# Patient Record
Sex: Male | Born: 1945
Health system: Southern US, Community
[De-identification: ages and names within clinical notes are randomized; demographics above are authoritative.]

## PROBLEM LIST (undated history)

## (undated) DIAGNOSIS — K219 Gastro-esophageal reflux disease without esophagitis: Secondary | ICD-10-CM

## (undated) DIAGNOSIS — I10 Essential (primary) hypertension: Secondary | ICD-10-CM

## (undated) DIAGNOSIS — IMO0002 Reserved for concepts with insufficient information to code with codable children: Secondary | ICD-10-CM

## (undated) DIAGNOSIS — J189 Pneumonia, unspecified organism: Secondary | ICD-10-CM

## (undated) DIAGNOSIS — M1990 Unspecified osteoarthritis, unspecified site: Secondary | ICD-10-CM

## (undated) DIAGNOSIS — R7303 Prediabetes: Secondary | ICD-10-CM

## (undated) DIAGNOSIS — C4490 Unspecified malignant neoplasm of skin, unspecified: Secondary | ICD-10-CM

## (undated) DIAGNOSIS — Z8711 Personal history of peptic ulcer disease: Secondary | ICD-10-CM

## (undated) DIAGNOSIS — N400 Enlarged prostate without lower urinary tract symptoms: Secondary | ICD-10-CM

## (undated) HISTORY — PX: JOINT REPLACEMENT: SHX530

## (undated) HISTORY — PX: TONSILLECTOMY AND ADENOIDECTOMY: SUR1326

## (undated) HISTORY — DX: Essential (primary) hypertension: I10

## (undated) HISTORY — PX: COLONOSCOPY: SHX174

## (undated) HISTORY — PX: INGUINAL HERNIA REPAIR: SUR1180

## (undated) HISTORY — DX: Benign prostatic hyperplasia without lower urinary tract symptoms: N40.0

## (undated) HISTORY — DX: Gastro-esophageal reflux disease without esophagitis: K21.9

## (undated) HISTORY — DX: Reserved for concepts with insufficient information to code with codable children: IMO0002

## (undated) HISTORY — PX: APPENDECTOMY: SHX54

## (undated) HISTORY — PX: UPPER GI ENDOSCOPY: SHX6162

---

## 2008-12-20 HISTORY — PX: COLONOSCOPY: SHX174

## 2008-12-20 LAB — HM COLONOSCOPY

## 2010-01-28 LAB — CBC AND DIFFERENTIAL
HCT: 50 % (ref 41–53)
WBC: 9.1 10^3/mL

## 2010-01-28 LAB — BASIC METABOLIC PANEL
BUN: 21 mg/dL (ref 4–21)
Creatinine: 1.1 mg/dL (ref <=1.3)
Potassium: 3.2 mmol/L — AB (ref 3.4–5.3)

## 2010-01-28 LAB — LIPID PANEL
Cholesterol: 130 mg/dL (ref 0–200)
HDL: 32 mg/dL — AB (ref 35–70)
LDL Cholesterol: 73 mg/dL

## 2010-01-28 LAB — HEPATIC FUNCTION PANEL: Alkaline Phosphatase: 72 U/L (ref 25–125)

## 2010-02-16 LAB — HEPATIC FUNCTION PANEL: AST: 23 U/L (ref 14–40)

## 2011-12-21 HISTORY — PX: MEDIAL PARTIAL KNEE REPLACEMENT: SHX5965

## 2012-03-10 LAB — HEMOGLOBIN A1C: Hgb A1c MFr Bld: 5.4 % (ref 4.0–6.0)

## 2013-08-13 DIAGNOSIS — M5137 Other intervertebral disc degeneration, lumbosacral region: Secondary | ICD-10-CM | POA: Diagnosis not present

## 2013-08-13 DIAGNOSIS — M999 Biomechanical lesion, unspecified: Secondary | ICD-10-CM | POA: Diagnosis not present

## 2013-08-15 DIAGNOSIS — M5137 Other intervertebral disc degeneration, lumbosacral region: Secondary | ICD-10-CM | POA: Diagnosis not present

## 2013-08-15 DIAGNOSIS — M999 Biomechanical lesion, unspecified: Secondary | ICD-10-CM | POA: Diagnosis not present

## 2013-09-11 DIAGNOSIS — M999 Biomechanical lesion, unspecified: Secondary | ICD-10-CM | POA: Diagnosis not present

## 2013-09-11 DIAGNOSIS — M5137 Other intervertebral disc degeneration, lumbosacral region: Secondary | ICD-10-CM | POA: Diagnosis not present

## 2013-09-12 DIAGNOSIS — Z23 Encounter for immunization: Secondary | ICD-10-CM | POA: Diagnosis not present

## 2013-09-12 DIAGNOSIS — J019 Acute sinusitis, unspecified: Secondary | ICD-10-CM | POA: Diagnosis not present

## 2013-09-24 DIAGNOSIS — Z471 Aftercare following joint replacement surgery: Secondary | ICD-10-CM | POA: Diagnosis not present

## 2013-09-24 DIAGNOSIS — Z96659 Presence of unspecified artificial knee joint: Secondary | ICD-10-CM | POA: Diagnosis not present

## 2013-10-19 ENCOUNTER — Ambulatory Visit (INDEPENDENT_AMBULATORY_CARE_PROVIDER_SITE_OTHER): Payer: Medicare Other | Admitting: Internal Medicine

## 2013-10-19 ENCOUNTER — Encounter: Payer: Self-pay | Admitting: Internal Medicine

## 2013-10-19 VITALS — BP 160/90 | HR 67 | Temp 97.4°F | Ht 66.0 in | Wt 181.2 lb

## 2013-10-19 DIAGNOSIS — K219 Gastro-esophageal reflux disease without esophagitis: Secondary | ICD-10-CM | POA: Diagnosis not present

## 2013-10-19 DIAGNOSIS — I10 Essential (primary) hypertension: Secondary | ICD-10-CM | POA: Insufficient documentation

## 2013-10-19 DIAGNOSIS — N400 Enlarged prostate without lower urinary tract symptoms: Secondary | ICD-10-CM | POA: Insufficient documentation

## 2013-10-19 HISTORY — DX: Benign prostatic hyperplasia without lower urinary tract symptoms: N40.0

## 2013-10-19 NOTE — Patient Instructions (Signed)
Health Maintenance, Males A healthy lifestyle and preventative care can promote health and wellness.  Maintain regular health, dental, and eye exams.  Eat a healthy diet. Foods like vegetables, fruits, whole grains, low-fat dairy products, and lean protein foods contain the nutrients you need without too many calories. Decrease your intake of foods high in solid fats, added sugars, and salt. Get information about a proper diet from your caregiver, if necessary.  Regular physical exercise is one of the most important things you can do for your health. Most adults should get at least 150 minutes of moderate-intensity exercise (any activity that increases your heart rate and causes you to sweat) each week. In addition, most adults need muscle-strengthening exercises on 2 or more days a week.   Maintain a healthy weight. The body mass index (BMI) is a screening tool to identify possible weight problems. It provides an estimate of body fat based on height and weight. Your caregiver can help determine your BMI, and can help you achieve or maintain a healthy weight. For adults 20 years and older:  A BMI below 18.5 is considered underweight.  A BMI of 18.5 to 24.9 is normal.  A BMI of 25 to 29.9 is considered overweight.  A BMI of 30 and above is considered obese.  Maintain normal blood lipids and cholesterol by exercising and minimizing your intake of saturated fat. Eat a balanced diet with plenty of fruits and vegetables. Blood tests for lipids and cholesterol should begin at age 20 and be repeated every 5 years. If your lipid or cholesterol levels are high, you are over 50, or you are a high risk for heart disease, you may need your cholesterol levels checked more frequently.Ongoing high lipid and cholesterol levels should be treated with medicines, if diet and exercise are not effective.  If you smoke, find out from your caregiver how to quit. If you do not use tobacco, do not start.  If you  choose to drink alcohol, do not exceed 2 drinks per day. One drink is considered to be 12 ounces (355 mL) of beer, 5 ounces (148 mL) of wine, or 1.5 ounces (44 mL) of liquor.  Avoid use of street drugs. Do not share needles with anyone. Ask for help if you need support or instructions about stopping the use of drugs.  High blood pressure causes heart disease and increases the risk of stroke. Blood pressure should be checked at least every 1 to 2 years. Ongoing high blood pressure should be treated with medicines if weight loss and exercise are not effective.  If you are 45 to 65 years old, ask your caregiver if you should take aspirin to prevent heart disease.  Diabetes screening involves taking a blood sample to check your fasting blood sugar level. This should be done once every 3 years, after age 45, if you are within normal weight and without risk factors for diabetes. Testing should be considered at a younger age or be carried out more frequently if you are overweight and have at least 1 risk factor for diabetes.  Colorectal cancer can be detected and often prevented. Most routine colorectal cancer screening begins at the age of 50 and continues through age 75. However, your caregiver may recommend screening at an earlier age if you have risk factors for colon cancer. On a yearly basis, your caregiver may provide home test kits to check for hidden blood in the stool. Use of a small camera at the end of a tube,   to directly examine the colon (sigmoidoscopy or colonoscopy), can detect the earliest forms of colorectal cancer. Talk to your caregiver about this at age 50, when routine screening begins. Direct examination of the colon should be repeated every 5 to 10 years through age 75, unless early forms of pre-cancerous polyps or small growths are found.  Hepatitis C blood testing is recommended for all people born from 1945 through 1965 and any individual with known risks for hepatitis C.  Healthy  men should no longer receive prostate-specific antigen (PSA) blood tests as part of routine cancer screening. Consult with your caregiver about prostate cancer screening.  Testicular cancer screening is not recommended for adolescents or adult males who have no symptoms. Screening includes self-exam, caregiver exam, and other screening tests. Consult with your caregiver about any symptoms you have or any concerns you have about testicular cancer.  Practice safe sex. Use condoms and avoid high-risk sexual practices to reduce the spread of sexually transmitted infections (STIs).  Use sunscreen with a sun protection factor (SPF) of 30 or greater. Apply sunscreen liberally and repeatedly throughout the day. You should seek shade when your shadow is shorter than you. Protect yourself by wearing long sleeves, pants, a wide-brimmed hat, and sunglasses year round, whenever you are outdoors.  Notify your caregiver of new moles or changes in moles, especially if there is a change in shape or color. Also notify your caregiver if a mole is larger than the size of a pencil eraser.  A one-time screening for abdominal aortic aneurysm (AAA) and surgical repair of large AAAs by sound wave imaging (ultrasonography) is recommended for ages 65 to 75 years who are current or former smokers.  Stay current with your immunizations. Document Released: 06/03/2008 Document Revised: 02/28/2012 Document Reviewed: 05/03/2011 [employer] Patient Information 2014 [employer].  

## 2013-10-19 NOTE — Assessment & Plan Note (Signed)
Well controlled on flomax and finsateride

## 2013-10-19 NOTE — Assessment & Plan Note (Signed)
Well controlled on prilosec.

## 2013-10-19 NOTE — Assessment & Plan Note (Signed)
Elevated today- he reports white coat syndrome BP usually 130/80 Continue current therapy Will check labs in 03/2014  Medical records reviewed. Time spent reviewing reecords approx 20 minutes

## 2013-10-19 NOTE — Progress Notes (Signed)
HPI Pt presents to the clinic today to establish care. He is transferring care from his PCP in [place]. He moved down her about 5 years. He has no concerns today.  Flu: yearly last 2014 Tetanus: 2013 Pneumovax: 2013 Colonoscopy: 2011 (5 years) Eye doctor: yearly Dentist: yearly PSA screening: 2014  Past Medical History  Diagnosis Date  . GERD (gastroesophageal reflux disease)   . Hypertension     Current Outpatient Prescriptions  Medication Sig Dispense Refill  . calcium carbonate (OS-CAL) 600 MG TABS tablet Take 600 mg by mouth 2 (two) times daily with a meal.      . finasteride (PROSCAR) 5 MG tablet Take 5 mg by mouth daily.      . Glucosamine-Chondroitin 250-200 MG TABS Take by mouth 2 (two) times daily.      . hydrochlorothiazide (HYDRODIURIL) 25 MG tablet Take 25 mg by mouth daily.      Marland Kitchen lisinopril (PRINIVIL,ZESTRIL) 40 MG tablet Take 40 mg by mouth daily.      . Multiple Vitamins-Minerals (CENTRUM SILVER ULTRA MENS) TABS Take by mouth daily.      Marland Kitchen omeprazole (PRILOSEC) 20 MG capsule Take 20 mg by mouth daily.      . tamsulosin (FLOMAX) 0.4 MG CAPS capsule Take 0.4 mg by mouth daily.       No current facility-administered medications for this visit.    Not on File  Family History  Problem Relation Age of Onset  . Hypertension Mother   . Cancer Father     lung  . Hyperlipidemia Maternal Grandfather   . Stroke Paternal Grandmother   . Hyperlipidemia Paternal Grandfather   . Diabetes Neg Hx     History   Social History  . Marital Status: Married    Spouse Name: N/A    Number of Children: N/A  . Years of Education: N/A   Occupational History  . Not on file.   Social History Main Topics  . Smoking status: Never Smoker   . Smokeless tobacco: Never Used  . Alcohol Use: Not on file  . Drug Use: Not on file  . Sexual Activity: Yes   Other Topics Concern  . Not on file   Social History Narrative  . No narrative on file    ROS:  Constitutional:  Denies fever, malaise, fatigue, headache or abrupt weight changes.  HEENT: Denies eye pain, eye redness, ear pain, ringing in the ears, wax buildup, runny nose, nasal congestion, bloody nose, or sore throat. Respiratory: Denies difficulty breathing, shortness of breath, cough or sputum production.   Cardiovascular: Denies chest pain, chest tightness, palpitations or swelling in the hands or feet.  Gastrointestinal: Denies abdominal pain, bloating, constipation, diarrhea or blood in the stool.  GU: Denies frequency, urgency, pain with urination, blood in urine, odor or discharge. Musculoskeletal: Denies decrease in range of motion, difficulty with gait, muscle pain or joint pain and swelling.  Skin: Denies redness, rashes, lesions or ulcercations.  Neurological: Denies dizziness, difficulty with memory, difficulty with speech or problems with balance and coordination.   No other specific complaints in a complete review of systems (except as listed in HPI above).  PE:  BP 160/90  Pulse 67  Temp(Src) 97.4 F (36.3 C) (Oral)  Ht 5\' 6"  (1.676 m)  Wt 181 lb 3.2 oz (82.192 kg)  BMI 29.26 kg/m2  SpO2 98% Wt Readings from Last 3 Encounters:  10/19/13 181 lb 3.2 oz (82.192 kg)    General: Appears his stated age, well  developed, well nourished in NAD. HEENT: Head: normal shape and size; Eyes: sclera white, no icterus, conjunctiva pink, PERRLA and EOMs intact; Ears: Tm's gray and intact, normal light reflex; Nose: mucosa pink and moist, septum midline; Throat/Mouth: Teeth present, mucosa pink and moist, no lesions or ulcerations noted.  Neck: Normal range of motion. Neck supple, trachea midline. No massses, lumps or thyromegaly present.  Cardiovascular: Normal rate and rhythm. S1,S2 noted.  No murmur, rubs or gallops noted. No JVD or BLE edema. No carotid bruits noted. Pulmonary/Chest: Normal effort and positive vesicular breath sounds. No respiratory distress. No wheezes, rales or ronchi noted.   Abdomen: Soft and nontender. Normal bowel sounds, no bruits noted. No distention or masses noted. Liver, spleen and kidneys non palpable. Musculoskeletal: Normal range of motion. No signs of joint swelling. No difficulty with gait.  Neurological: Alert and oriented. Cranial nerves II-XII intact. Coordination normal. +DTRs bilaterally. Psychiatric: Mood and affect normal. Behavior is normal. Judgment and thought content normal.      Assessment and Plan:  Prevent Health:  All health main UTD Will review records and send them back to you Will perform lab work in 03/2014 at next visit  RTC in 6 months or sooner if needed

## 2013-10-25 DIAGNOSIS — M5137 Other intervertebral disc degeneration, lumbosacral region: Secondary | ICD-10-CM | POA: Diagnosis not present

## 2013-10-25 DIAGNOSIS — M999 Biomechanical lesion, unspecified: Secondary | ICD-10-CM | POA: Diagnosis not present

## 2013-10-29 ENCOUNTER — Ambulatory Visit: Payer: Self-pay | Admitting: Internal Medicine

## 2013-11-21 DIAGNOSIS — M999 Biomechanical lesion, unspecified: Secondary | ICD-10-CM | POA: Diagnosis not present

## 2013-11-21 DIAGNOSIS — M5137 Other intervertebral disc degeneration, lumbosacral region: Secondary | ICD-10-CM | POA: Diagnosis not present

## 2013-12-18 DIAGNOSIS — M999 Biomechanical lesion, unspecified: Secondary | ICD-10-CM | POA: Diagnosis not present

## 2013-12-18 DIAGNOSIS — M5137 Other intervertebral disc degeneration, lumbosacral region: Secondary | ICD-10-CM | POA: Diagnosis not present

## 2014-01-06 DIAGNOSIS — R030 Elevated blood-pressure reading, without diagnosis of hypertension: Secondary | ICD-10-CM | POA: Diagnosis not present

## 2014-01-06 DIAGNOSIS — J111 Influenza due to unidentified influenza virus with other respiratory manifestations: Secondary | ICD-10-CM | POA: Diagnosis not present

## 2014-01-15 DIAGNOSIS — M999 Biomechanical lesion, unspecified: Secondary | ICD-10-CM | POA: Diagnosis not present

## 2014-01-15 DIAGNOSIS — M5137 Other intervertebral disc degeneration, lumbosacral region: Secondary | ICD-10-CM | POA: Diagnosis not present

## 2014-03-07 ENCOUNTER — Other Ambulatory Visit (INDEPENDENT_AMBULATORY_CARE_PROVIDER_SITE_OTHER): Payer: Medicare Other

## 2014-03-07 ENCOUNTER — Ambulatory Visit (INDEPENDENT_AMBULATORY_CARE_PROVIDER_SITE_OTHER): Payer: Medicare Other | Admitting: Physician Assistant

## 2014-03-07 ENCOUNTER — Encounter: Payer: Self-pay | Admitting: Physician Assistant

## 2014-03-07 VITALS — BP 122/88 | HR 84 | Temp 98.3°F | Ht 66.0 in | Wt 177.2 lb

## 2014-03-07 DIAGNOSIS — N400 Enlarged prostate without lower urinary tract symptoms: Secondary | ICD-10-CM | POA: Diagnosis not present

## 2014-03-07 DIAGNOSIS — I10 Essential (primary) hypertension: Secondary | ICD-10-CM

## 2014-03-07 DIAGNOSIS — Z0000 Encounter for general adult medical examination without abnormal findings: Secondary | ICD-10-CM

## 2014-03-07 DIAGNOSIS — K219 Gastro-esophageal reflux disease without esophagitis: Secondary | ICD-10-CM | POA: Diagnosis not present

## 2014-03-07 DIAGNOSIS — Z125 Encounter for screening for malignant neoplasm of prostate: Secondary | ICD-10-CM

## 2014-03-07 LAB — CBC WITH DIFFERENTIAL/PLATELET
BASOS PCT: 0.3 % (ref 0.0–3.0)
Basophils Absolute: 0 10*3/uL (ref 0.0–0.1)
EOS PCT: 1.9 % (ref 0.0–5.0)
Eosinophils Absolute: 0.2 10*3/uL (ref 0.0–0.7)
HEMATOCRIT: 47.6 % (ref 39.0–52.0)
HEMOGLOBIN: 16.3 g/dL (ref 13.0–17.0)
LYMPHS ABS: 2.8 10*3/uL (ref 0.7–4.0)
Lymphocytes Relative: 35 % (ref 12.0–46.0)
MCHC: 34.2 g/dL (ref 30.0–36.0)
MCV: 93.4 fl (ref 78.0–100.0)
Monocytes Absolute: 0.6 10*3/uL (ref 0.1–1.0)
Monocytes Relative: 7.1 % (ref 3.0–12.0)
NEUTROS ABS: 4.5 10*3/uL (ref 1.4–7.7)
Neutrophils Relative %: 55.7 % (ref 43.0–77.0)
PLATELETS: 253 10*3/uL (ref 150.0–400.0)
RBC: 5.1 Mil/uL (ref 4.22–5.81)
RDW: 13.2 % (ref 11.5–14.6)
WBC: 8.1 10*3/uL (ref 4.5–10.5)

## 2014-03-07 LAB — BASIC METABOLIC PANEL
BUN: 18 mg/dL (ref 6–23)
CO2: 28 mEq/L (ref 19–32)
Calcium: 9.8 mg/dL (ref 8.4–10.5)
Chloride: 99 mEq/L (ref 96–112)
Creatinine, Ser: 1.1 mg/dL (ref 0.4–1.5)
GFR: 69.79 mL/min (ref >60.00)
GLUCOSE: 100 mg/dL — AB (ref 70–99)
Potassium: 3.8 mEq/L (ref 3.5–5.1)
Sodium: 137 mEq/L (ref 135–145)

## 2014-03-07 LAB — HEPATIC FUNCTION PANEL
ALT: 25 U/L (ref 0–53)
AST: 19 U/L (ref 0–37)
Albumin: 5 g/dL (ref 3.5–5.2)
Alkaline Phosphatase: 63 U/L (ref 39–117)
BILIRUBIN TOTAL: 1 mg/dL (ref 0.3–1.2)
Bilirubin, Direct: 0.1 mg/dL (ref 0.0–0.3)
Total Protein: 7.8 g/dL (ref 6.0–8.3)

## 2014-03-07 LAB — LIPID PANEL
CHOLESTEROL: 203 mg/dL — AB (ref 0–200)
HDL: 41 mg/dL (ref >39.00)
LDL CALC: 131 mg/dL — AB (ref 0–99)
Total CHOL/HDL Ratio: 5
Triglycerides: 156 mg/dL — ABNORMAL HIGH (ref 0.0–149.0)
VLDL: 31.2 mg/dL (ref 0.0–40.0)

## 2014-03-07 LAB — URINALYSIS, ROUTINE W REFLEX MICROSCOPIC
Bilirubin Urine: NEGATIVE
HGB URINE DIPSTICK: NEGATIVE
KETONES UR: 15 — AB
Leukocytes, UA: NEGATIVE
Nitrite: NEGATIVE
Specific Gravity, Urine: 1.02 (ref 1.000–1.030)
Total Protein, Urine: NEGATIVE
URINE GLUCOSE: NEGATIVE
UROBILINOGEN UA: 0.2 (ref 0.0–1.0)
pH: 5.5 (ref 5.0–8.0)

## 2014-03-07 LAB — TSH: TSH: 1.28 u[IU]/mL (ref 0.35–5.50)

## 2014-03-07 LAB — PSA: PSA: 1.94 ng/mL (ref 0.10–4.00)

## 2014-03-07 MED ORDER — FINASTERIDE 5 MG PO TABS: 5.0000 mg | ORAL_TABLET | Freq: Every day | ORAL | Status: DC

## 2014-03-07 MED ORDER — HYDROCHLOROTHIAZIDE 25 MG PO TABS: 25.0000 mg | ORAL_TABLET | Freq: Every day | ORAL | Status: DC

## 2014-03-07 MED ORDER — TAMSULOSIN HCL 0.4 MG PO CAPS: 0.4000 mg | ORAL_CAPSULE | Freq: Every day | ORAL | Status: DC

## 2014-03-07 MED ORDER — LISINOPRIL 40 MG PO TABS: 40.0000 mg | ORAL_TABLET | Freq: Every day | ORAL | Status: DC

## 2014-03-07 NOTE — Progress Notes (Signed)
Pre visit review using our clinic review tool, if applicable. No additional management support is needed unless otherwise documented below in the visit note. 

## 2014-03-07 NOTE — Progress Notes (Signed)
Patient ID: Christian Dodson is a 68 y.o. male DOB: Dec 10, 2046 MRN: [id number]     HPI:  Patient is a 68 year old male who presents for his yearly wellness exam. Patient is new to me although he has been seen in this office before.  He moved to this area nearly a year ago. Previously lived in [place].  He is current and up to date on immunizations. History of HTN, GERD and BPH all well controlled on current medications. Reports last seen by urologist over five years ago had enlarged prostate, with biopsies and some kind of "microwave" treatment. Has not returned since. Reports he monitors his blood pressure at home normally runs around 130/85. Takes omeprazole for GERD treatment. Reports five years ago he had upper endoscopy due to swallowing difficulties and had esophageal dilation. Denies chest pain/palpitations, SOB, cough, change in vision or visual disturbances, change in bowel/bladder habits, blood in urine or stool. Denies N/V/F, numbness tingling, weakness or extremity swelling.    Influenza: 10/14 Pneumonia: 4/14 Tetanus: 2013 Eye Dr. Not in last year Dentist twice yearly Colonoscopy: 5 years ago, uncertain of recommended return  ROS: As stated in HPI. All other systems negative  Past Medical History  Diagnosis Date  . GERD (gastroesophageal reflux disease)   . Hypertension   . HTN (hypertension) 10/19/2013  . BPH (benign prostatic hyperplasia) 10/19/2013   Family History  Problem Relation Age of Onset  . Hypertension Mother   . Cancer Father     lung  . Hyperlipidemia Maternal Grandfather   . Stroke Paternal Grandmother   . Hyperlipidemia Paternal Grandfather   . Diabetes Neg Hx    History   Social History  . Marital Status: Married    Spouse Name: N/A    Number of Children: N/A  . Years of Education: N/A   Social History Main Topics  . Smoking status: Never Smoker   . Smokeless tobacco: Never Used  . Alcohol Use: None  . Drug Use: None  . Sexual Activity: Yes     Other Topics Concern  . None   Social History Narrative  . None   History reviewed. No pertinent past surgical history. Current Outpatient Prescriptions on File Prior to Visit  Medication Sig Dispense Refill  . calcium carbonate (OS-CAL) 600 MG TABS tablet Take 600 mg by mouth 2 (two) times daily with a meal.      . Glucosamine-Chondroitin 250-200 MG TABS Take by mouth 2 (two) times daily.      . Multiple Vitamins-Minerals (CENTRUM SILVER ULTRA MENS) TABS Take by mouth daily.      Marland Kitchen omeprazole (PRILOSEC) 20 MG capsule Take 20 mg by mouth daily.       No current facility-administered medications on file prior to visit.   No Known Allergies  PE:  Filed Vitals:   03/07/14 1311  BP: 122/88  Pulse: 84  Temp: 98.3 F (36.8 C)    CONSTITUTIONAL: Well developed, well nourished, pleasant, appears stated age, in NAD HEENT: normocephalic, atraumatic, bilateral ext/int canals normal. Bilateral TM's without injections, bulging, erythema. Nose normal, uvula midline, oropharynx clear and moist. EYES: PERRLA, bilateral EOM and conjunctiva normal NECK: FROM, supple, without thyromegaly or mass, no carotid bruits CARDIO: RRR, normal S1 and S2, distal pulses intact. PULM/CHEST CTA bilateral, no wheezes, rales or rhonchi. Non tender. ABD: appearance normal, soft, nontender. Normal bowel sounds x 4 quadrants, non palpable liver, kidney or spleen. GU: deferred to Urology. MUSC: FROM U/LE bilateral, FROM of thoracic  and lumbar spine, no midline tenderness noted.  LYMPH: no cervical, supraclavicular adenopathy NEURO: alert and oriented x 3, no cranial nerve deficit, motor strength and coordination NL. DTR's intact. Negative romberg. Gait normal. SKIN: warm, dry, no rash or lesions noted. PSYCH: Mood and affect normal, speech normal.   Lab Results  Component Value Date   WBC 9.1 01/28/2010   HGB 17.0 01/28/2010   HCT 50 01/28/2010   PLT 190 01/28/2010   CHOL 130 01/28/2010   HDL 32* 01/28/2010    LDLCALC 73 01/28/2010   ALT 41* 02/16/2010   AST 23 02/16/2010   NA 137 01/28/2010   K 3.2* 01/28/2010   CREATININE 1.1 01/28/2010   BUN 21 01/28/2010   HGBA1C 5.4 03/10/2012     ASSESSMENT and PLAN   CPX/v70.0 - Patient has been counseled on age-appropriate routine health concerns for screening and prevention. These are reviewed and up-to-date. Immunizations are up-to-date or declined. Labs ordered and will be reviewed.  HTN: continue on current medications Lisinopril 40 mg once daily HCTZ 25 mg once daily  GERD Omeprazole 20 mg once daily  BPH Flomax 0.4 mg tablet once daily Finasteride 5 mg tablet once daily Referral to urology for evaluation.

## 2014-03-07 NOTE — Assessment & Plan Note (Signed)
Omeprazole 20 mg once daily well controlled

## 2014-03-07 NOTE — Assessment & Plan Note (Addendum)
Lisinopril 40 mg once daily HCTZ 25 mg once daily  BP Readings from Last 3 Encounters:  03/07/14 122/88  10/19/13 160/90

## 2014-03-07 NOTE — Patient Instructions (Addendum)
It was great meeting you today Christian Dodson!   Refills for your prescriptions have been sent to your pharmacy.  Labs have been ordered for you, when you report to lab please be fasting.       Diet for Gastroesophageal Reflux Disease, Adult Reflux is when stomach acid flows up into the esophagus. The esophagus becomes irritated and sore (inflammation). When reflux happens often and is severe, it is called gastroesophageal reflux disease (GERD). What you eat can help ease any discomfort caused by GERD. FOODS OR DRINKS TO AVOID OR LIMIT  Coffee and black tea, with or without caffeine.  Bubbly (carbonated) drinks with caffeine or energy drinks.  Strong spices, such as pepper, cayenne pepper, curry, or chili powder.  Peppermint or spearmint.  Chocolate.  High-fat foods, such as meats, fried food, oils, butter, or nuts.  Fruits and vegetables that cause discomfort. This includes citrus fruits and tomatoes.  Alcohol. If a certain food or drink irritates your GERD, avoid eating or drinking it. THINGS THAT MAY HELP GERD INCLUDE:  Eat meals slowly.  Eat 5 to 6 small meals a day, not 3 large meals.  Do not eat food for a certain amount of time if it causes discomfort.  Wait 3 hours after eating before lying down.  Keep the head of your bed raised 6 to 9 inches (15 23 centimeters). Put a foam wedge or blocks under the legs of the bed.  Stay active. Weight loss, if needed, may help ease your discomfort.  Wear loose-fitting clothing.  Do not smoke or chew tobacco. Document Released: 06/06/2012 Document Reviewed: 06/06/2012 [facility] Patient Information 2014 [place].       Health Maintenance, Males A healthy lifestyle and preventative care can promote health and wellness.  Maintain regular health, dental, and eye exams.  Eat a healthy diet. Foods like vegetables, fruits, whole grains, low-fat dairy products, and lean protein foods contain the nutrients you need and  are low in calories. Decrease your intake of foods high in solid fats, added sugars, and salt. Get information about a proper diet from your health care provider, if necessary.  Regular physical exercise is one of the most important things you can do for your health. Most adults should get at least 150 minutes of moderate-intensity exercise (any activity that increases your heart rate and causes you to sweat) each week. In addition, most adults need muscle-strengthening exercises on 2 or more days a week.   Maintain a healthy weight. The body mass index (BMI) is a screening tool to identify possible weight problems. It provides an estimate of body fat based on height and weight. Your health care provider can find your BMI and can help you achieve or maintain a healthy weight. For males 20 years and older:  A BMI below 18.5 is considered underweight.  A BMI of 18.5 to 24.9 is normal.  A BMI of 25 to 29.9 is considered overweight.  A BMI of 30 and above is considered obese.  Maintain normal blood lipids and cholesterol by exercising and minimizing your intake of saturated fat. Eat a balanced diet with plenty of fruits and vegetables. Blood tests for lipids and cholesterol should begin at age 72 and be repeated every 5 years. If your lipid or cholesterol levels are high, you are over 50, or you are at high risk for heart disease, you may need your cholesterol levels checked more frequently.Ongoing high lipid and cholesterol levels should be treated with medicines, if diet and  exercise are not working.  If you smoke, find out from your health care provider how to quit. If you do not use tobacco, do not start.  Lung cancer screening is recommended for adults aged 12 80 years who are at high risk for developing lung cancer because of a history of smoking. A yearly low-dose CT scan of the lungs is recommended for people who have at least a 30-pack-year history of smoking and are a current smoker or have  quit within the past 15 years. A pack year of smoking is smoking an average of 1 pack of cigarettes a day for 1 year (for example, a 30-pack-year history of smoking could mean smoking 1 pack a day for 30 years or 2 packs a day for 15 years). Yearly screening should continue until the smoker has stopped smoking for at least 15 years. Yearly screening should be stopped for people who develop a health problem that would prevent them from having lung cancer treatment.  If you choose to drink alcohol, do not have more than 2 drinks per day. One drink is considered to be 12 oz (360 mL) of beer, 5 oz (150 mL) of wine, or 1.5 oz (45 mL) of liquor.  Avoid use of street drugs. Do not share needles with anyone. Ask for help if you need support or instructions about stopping the use of drugs.  High blood pressure causes heart disease and increases the risk of stroke. Blood pressure should be checked at least every 1 2 years. Ongoing high blood pressure should be treated with medicines if weight loss and exercise are not effective.  If you are 71 66 years old, ask your health care provider if you should take aspirin to prevent heart disease.  Diabetes screening involves taking a blood sample to check your fasting blood sugar level. This should be done once every 3 years after age 26, if you are at a normal weight and without risk factors for diabetes. Testing should be considered at a younger age or be carried out more frequently if you are overweight and have at least 1 risk factor for diabetes.  Colorectal cancer can be detected and often prevented. Most routine colorectal cancer screening begins at the age of 40 and continues through age 79. However, your health care provider may recommend screening at an earlier age if you have risk factors for colon cancer. On a yearly basis, your health care provider may provide home test kits to check for hidden blood in the stool. A small camera at the end of a tube may be  used to directly examine the colon (sigmoidoscopy or colonoscopy) to detect the earliest forms of colorectal cancer. Talk to your health care provider about this at age 2, when routine screening begins. A direct exam of the colon should be repeated every 5 10 years through age 66, unless early forms of pre-cancerous polyps or small growths are found.  People who are at an increased risk for hepatitis B should be screened for this virus. You are considered at high risk for hepatitis B if:  You were born in a country where hepatitis B occurs often. Talk with your health care provider about which countries are considered high-risk.  Your parents were born in a high-risk country and you have not received a shot to protect against hepatitis B (hepatitis B vaccine).  You have HIV or AIDS.  You use needles to inject street drugs.  You live with, or have sex with,  someone who has hepatitis B.  You are a man who has sex with other men (MSM).  You get hemodialysis treatment.  You take certain medicines for conditions like cancer, organ transplantation, and autoimmune conditions.  Hepatitis C blood testing is recommended for all people born from [birth year] through 1965 and any individual with known risk factors for hepatitis C.  Healthy men should no longer receive prostate-specific antigen (PSA) blood tests as part of routine cancer screening. Talk to your health care provider about prostate cancer screening.  Testicular cancer screening is not recommended for adolescents or adult males who have no symptoms. Screening includes self-exam, a health care provider exam, and other screening tests. Consult with your health care provider about any symptoms you have or any concerns you have about testicular cancer.  Practice safe sex. Use condoms and avoid high-risk sexual practices to reduce the spread of sexually transmitted infections (STIs).  Use sunscreen. Apply sunscreen liberally and repeatedly  throughout the day. You should seek shade when your shadow is shorter than you. Protect yourself by wearing long sleeves, pants, a wide-brimmed hat, and sunglasses year round, whenever you are outdoors.  Tell your health care provider of new moles or changes in moles, especially if there is a change in shape or color. Also tell your provider if a mole is larger than the size of a pencil eraser.  A one-time screening for abdominal aortic aneurysm (AAA) and surgical repair of large AAAs by ultrasound is recommended for men aged 58 75 years who are current or former smokers.  Stay current with your vaccines (immunizations). Document Released: 06/03/2008 Document Revised: 09/26/2013 Document Reviewed: 05/03/2011 [facility] Patient Information 2014 [place], [place].   Hypertension Hypertension is another name for high blood pressure. High blood pressure may mean that your heart needs to work harder to pump blood. Blood pressure consists of two numbers, which includes a higher number over a lower number (example: 110/72). HOME CARE   Make lifestyle changes as told by your doctor. This may include weight loss and exercise.  Take your blood pressure medicine every day.  Limit how much salt you use.  Stop smoking if you smoke.  Do not use drugs.  Talk to your doctor if you are using decongestants or birth control pills. These medicines might make blood pressure higher.  Females should not drink more than 1 alcoholic drink per day. Males should not drink more than 2 alcoholic drinks per day.  See your doctor as told. GET HELP RIGHT AWAY IF:   You have a blood pressure reading with a top number of 180 or higher.  You get a very bad headache.  You get blurred or changing vision.  You feel confused.  You feel weak, numb, or faint.  You get chest or belly (abdominal) pain.  You throw up (vomit).  You cannot breathe very well. MAKE SURE YOU:   Understand these instructions.  Will  watch your condition.  Will get help right away if you are not doing well or get worse. Document Released: 05/24/2008 Document Revised: 02/28/2012 Document Reviewed: 05/24/2008 [facility] Patient Information 2014 [place], [place].

## 2014-03-07 NOTE — Assessment & Plan Note (Signed)
Flomax 0.4 mg tablet once daily Finasteride 5 mg tablet once daily Referral to urology for evaluation.

## 2014-03-11 ENCOUNTER — Encounter: Payer: Self-pay | Admitting: Physician Assistant

## 2014-03-12 ENCOUNTER — Ambulatory Visit: Payer: Medicare Other | Admitting: Physician Assistant

## 2014-03-16 DIAGNOSIS — J019 Acute sinusitis, unspecified: Secondary | ICD-10-CM | POA: Diagnosis not present

## 2014-03-16 DIAGNOSIS — J00 Acute nasopharyngitis [common cold]: Secondary | ICD-10-CM | POA: Diagnosis not present

## 2014-04-01 DIAGNOSIS — N138 Other obstructive and reflux uropathy: Secondary | ICD-10-CM | POA: Diagnosis not present

## 2014-04-01 DIAGNOSIS — N139 Obstructive and reflux uropathy, unspecified: Secondary | ICD-10-CM | POA: Diagnosis not present

## 2014-04-01 DIAGNOSIS — N401 Enlarged prostate with lower urinary tract symptoms: Secondary | ICD-10-CM | POA: Diagnosis not present

## 2014-04-01 DIAGNOSIS — N529 Male erectile dysfunction, unspecified: Secondary | ICD-10-CM | POA: Diagnosis not present

## 2014-04-30 DIAGNOSIS — J019 Acute sinusitis, unspecified: Secondary | ICD-10-CM | POA: Diagnosis not present

## 2014-09-17 DIAGNOSIS — Z23 Encounter for immunization: Secondary | ICD-10-CM | POA: Diagnosis not present

## 2015-03-17 ENCOUNTER — Ambulatory Visit: Payer: Medicare Other | Admitting: Internal Medicine

## 2015-03-24 ENCOUNTER — Ambulatory Visit (INDEPENDENT_AMBULATORY_CARE_PROVIDER_SITE_OTHER): Payer: Medicare Other | Admitting: Internal Medicine

## 2015-03-24 ENCOUNTER — Other Ambulatory Visit (INDEPENDENT_AMBULATORY_CARE_PROVIDER_SITE_OTHER): Payer: Medicare Other

## 2015-03-24 ENCOUNTER — Encounter: Payer: Self-pay | Admitting: Internal Medicine

## 2015-03-24 VITALS — BP 140/92 | HR 88 | Temp 98.5°F | Resp 14 | Ht 66.0 in | Wt 181.1 lb

## 2015-03-24 DIAGNOSIS — N400 Enlarged prostate without lower urinary tract symptoms: Secondary | ICD-10-CM | POA: Diagnosis not present

## 2015-03-24 DIAGNOSIS — E785 Hyperlipidemia, unspecified: Secondary | ICD-10-CM

## 2015-03-24 DIAGNOSIS — I10 Essential (primary) hypertension: Secondary | ICD-10-CM | POA: Diagnosis not present

## 2015-03-24 DIAGNOSIS — Z0000 Encounter for general adult medical examination without abnormal findings: Secondary | ICD-10-CM

## 2015-03-24 DIAGNOSIS — K219 Gastro-esophageal reflux disease without esophagitis: Secondary | ICD-10-CM | POA: Diagnosis not present

## 2015-03-24 LAB — LDL CHOLESTEROL, DIRECT: LDL DIRECT: 105 mg/dL

## 2015-03-24 LAB — COMPREHENSIVE METABOLIC PANEL
ALT: 24 U/L (ref 0–53)
AST: 16 U/L (ref 0–37)
Albumin: 4.5 g/dL (ref 3.5–5.2)
Alkaline Phosphatase: 67 U/L (ref 39–117)
BILIRUBIN TOTAL: 0.6 mg/dL (ref 0.2–1.2)
BUN: 23 mg/dL (ref 6–23)
CO2: 31 mEq/L (ref 19–32)
CREATININE: 1.07 mg/dL (ref 0.40–1.50)
Calcium: 9.9 mg/dL (ref 8.4–10.5)
Chloride: 102 mEq/L (ref 96–112)
GFR: 73.33 mL/min (ref >60.00)
Glucose, Bld: 104 mg/dL — ABNORMAL HIGH (ref 70–99)
Potassium: 4.1 mEq/L (ref 3.5–5.1)
Sodium: 139 mEq/L (ref 135–145)
Total Protein: 7.2 g/dL (ref 6.0–8.3)

## 2015-03-24 LAB — LIPID PANEL
Cholesterol: 188 mg/dL (ref 0–200)
HDL: 33.4 mg/dL — ABNORMAL LOW (ref >39.00)
NONHDL: 154.6
Total CHOL/HDL Ratio: 6
Triglycerides: 388 mg/dL — ABNORMAL HIGH (ref 0.0–149.0)
VLDL: 77.6 mg/dL — AB (ref 0.0–40.0)

## 2015-03-24 LAB — PSA: PSA: 1.76 ng/mL (ref 0.10–4.00)

## 2015-03-24 MED ORDER — LISINOPRIL 40 MG PO TABS: 40.0000 mg | ORAL_TABLET | Freq: Every day | ORAL | Status: DC

## 2015-03-24 MED ORDER — TAMSULOSIN HCL 0.4 MG PO CAPS: 0.4000 mg | ORAL_CAPSULE | Freq: Every day | ORAL | Status: DC

## 2015-03-24 MED ORDER — HYDROCHLOROTHIAZIDE 25 MG PO TABS: 25.0000 mg | ORAL_TABLET | Freq: Every day | ORAL | Status: DC

## 2015-03-24 MED ORDER — FINASTERIDE 5 MG PO TABS: 5.0000 mg | ORAL_TABLET | Freq: Every day | ORAL | Status: DC

## 2015-03-24 NOTE — Progress Notes (Signed)
Pre visit review using our clinic review tool, if applicable. No additional management support is needed unless otherwise documented below in the visit note. 

## 2015-03-24 NOTE — Patient Instructions (Signed)
We will check the blood work today and call you back with the results.   We have sent in your refills of the medicines.   You are doing well overall so keep up the good work. Make sure to use sunscreen while doing outdoor yard work or golfing to decrease the damage to the skin.  Come back in about 1 year, if you have problems or questions please feel free to call us or come back sooner.   Health Maintenance A healthy lifestyle and preventative care can promote health and wellness.  Maintain regular health, dental, and eye exams.  Eat a healthy diet. Foods like vegetables, fruits, whole grains, low-fat dairy products, and lean protein foods contain the nutrients you need and are low in calories. Decrease your intake of foods high in solid fats, added sugars, and salt. Get information about a proper diet from your health care provider, if necessary.  Regular physical exercise is one of the most important things you can do for your health. Most adults should get at least 150 minutes of moderate-intensity exercise (any activity that increases your heart rate and causes you to sweat) each week. In addition, most adults need muscle-strengthening exercises on 2 or more days a week.   Maintain a healthy weight. The body mass index (BMI) is a screening tool to identify possible weight problems. It provides an estimate of body fat based on height and weight. Your health care provider can find your BMI and can help you achieve or maintain a healthy weight. For males 20 years and older:  A BMI below 18.5 is considered underweight.  A BMI of 18.5 to 24.9 is normal.  A BMI of 25 to 29.9 is considered overweight.  A BMI of 30 and above is considered obese.  Maintain normal blood lipids and cholesterol by exercising and minimizing your intake of saturated fat. Eat a balanced diet with plenty of fruits and vegetables. Blood tests for lipids and cholesterol should begin at age 8 and be repeated every 5  years. If your lipid or cholesterol levels are high, you are over age 51, or you are at high risk for heart disease, you may need your cholesterol levels checked more frequently.Ongoing high lipid and cholesterol levels should be treated with medicines if diet and exercise are not working.  If you smoke, find out from your health care provider how to quit. If you do not use tobacco, do not start.  Lung cancer screening is recommended for adults aged 48-80 years who are at high risk for developing lung cancer because of a history of smoking. A yearly low-dose CT scan of the lungs is recommended for people who have at least a 30-pack-year history of smoking and are current smokers or have quit within the past 15 years. A pack year of smoking is smoking an average of 1 pack of cigarettes a day for 1 year (for example, a 30-pack-year history of smoking could mean smoking 1 pack a day for 30 years or 2 packs a day for 15 years). Yearly screening should continue until the smoker has stopped smoking for at least 15 years. Yearly screening should be stopped for people who develop a health problem that would prevent them from having lung cancer treatment.  If you choose to drink alcohol, do not have more than 2 drinks per day. One drink is considered to be 12 oz (360 mL) of beer, 5 oz (150 mL) of wine, or 1.5 oz (45 mL) of  liquor.  Avoid the use of street drugs. Do not share needles with anyone. Ask for help if you need support or instructions about stopping the use of drugs.  High blood pressure causes heart disease and increases the risk of stroke. Blood pressure should be checked at least every 1-2 years. Ongoing high blood pressure should be treated with medicines if weight loss and exercise are not effective.  If you are 38-37 years old, ask your health care provider if you should take aspirin to prevent heart disease.  Diabetes screening involves taking a blood sample to check your fasting blood sugar  level. This should be done once every 3 years after age 68 if you are at a normal weight and without risk factors for diabetes. Testing should be considered at a younger age or be carried out more frequently if you are overweight and have at least 1 risk factor for diabetes.  Colorectal cancer can be detected and often prevented. Most routine colorectal cancer screening begins at the age of 50 and continues through age 84. However, your health care provider may recommend screening at an earlier age if you have risk factors for colon cancer. On a yearly basis, your health care provider may provide home test kits to check for hidden blood in the stool. A small camera at the end of a tube may be used to directly examine the colon (sigmoidoscopy or colonoscopy) to detect the earliest forms of colorectal cancer. Talk to your health care provider about this at age 73 when routine screening begins. A direct exam of the colon should be repeated every 5-10 years through age 71, unless early forms of precancerous polyps or small growths are found.  People who are at an increased risk for hepatitis B should be screened for this virus. You are considered at high risk for hepatitis B if:  You were born in a country where hepatitis B occurs often. Talk with your health care provider about which countries are considered high risk.  Your parents were born in a high-risk country and you have not received a shot to protect against hepatitis B (hepatitis B vaccine).  You have HIV or AIDS.  You use needles to inject street drugs.  You live with, or have sex with, someone who has hepatitis B.  You are a man who has sex with other men (MSM).  You get hemodialysis treatment.  You take certain medicines for conditions like cancer, organ transplantation, and autoimmune conditions.  Hepatitis C blood testing is recommended for all people born from 80 through 1965 and any individual with known risk factors for  hepatitis C.  Healthy men should no longer receive prostate-specific antigen (PSA) blood tests as part of routine cancer screening. Talk to your health care provider about prostate cancer screening.  Testicular cancer screening is not recommended for adolescents or adult males who have no symptoms. Screening includes self-exam, a health care provider exam, and other screening tests. Consult with your health care provider about any symptoms you have or any concerns you have about testicular cancer.  Practice safe sex. Use condoms and avoid high-risk sexual practices to reduce the spread of sexually transmitted infections (STIs).  You should be screened for STIs, including gonorrhea and chlamydia if:  You are sexually active and are younger than 24 years.  You are older than 24 years, and your health care provider tells you that you are at risk for this type of infection.  Your sexual activity has changed  since you were last screened, and you are at an increased risk for chlamydia or gonorrhea. Ask your health care provider if you are at risk.  If you are at risk of being infected with HIV, it is recommended that you take a prescription medicine daily to prevent HIV infection. This is called pre-exposure prophylaxis (PrEP). You are considered at risk if:  You are a man who has sex with other men (MSM).  You are a heterosexual man who is sexually active with multiple partners.  You take drugs by injection.  You are sexually active with a partner who has HIV.  Talk with your health care provider about whether you are at high risk of being infected with HIV. If you choose to begin PrEP, you should first be tested for HIV. You should then be tested every 3 months for as long as you are taking PrEP.  Use sunscreen. Apply sunscreen liberally and repeatedly throughout the day. You should seek shade when your shadow is shorter than you. Protect yourself by wearing long sleeves, pants, a  wide-brimmed hat, and sunglasses year round whenever you are outdoors.  Tell your health care provider of new moles or changes in moles, especially if there is a change in shape or color. Also, tell your health care provider if a mole is larger than the size of a pencil eraser.  A one-time screening for abdominal aortic aneurysm (AAA) and surgical repair of large AAAs by ultrasound is recommended for men aged 43-75 years who are current or former smokers.  Stay current with your vaccines (immunizations). Document Released: 06/03/2008 Document Revised: 12/11/2013 Document Reviewed: 05/03/2011 [facility] Patient Information 2015 [place], [place]. This information is not intended to replace advice given to you by your health care provider. Make sure you discuss any questions you have with your health care provider.

## 2015-03-26 ENCOUNTER — Encounter: Payer: Self-pay | Admitting: Internal Medicine

## 2015-03-26 DIAGNOSIS — Z0000 Encounter for general adult medical examination without abnormal findings: Secondary | ICD-10-CM | POA: Insufficient documentation

## 2015-03-26 NOTE — Assessment & Plan Note (Signed)
Schedule of 10 year screening given to patient. Talked with him about mole changes to watch out for and sun protection with clothing, hats, and sunscreen. Due for pneumonia booster which he would like to get next time. Flu shot done and shingles done. Colonoscopy due in 2021 and tetanus update due in 2023.

## 2015-03-26 NOTE — Assessment & Plan Note (Signed)
Check PSA, is on finasteride and tamsulosin with fairly good results. Does follow with urology yearly.

## 2015-03-26 NOTE — Assessment & Plan Note (Signed)
Take prilosec prn. Not daily. No indication for imaging at this time.

## 2015-03-26 NOTE — Progress Notes (Signed)
   Subjective:    Patient ID: Christian Dodson, male    DOB: July 09, 1946, 69 y.o.   MRN: [id number]  HPI Here for medicare wellness, please see A/P for status and treatment of current medical problems. No new complaints today.   Diet: heart healthy Physical activity: exercises 2-3 times per week Depression/mood screen: negative Hearing: intact to whispered voice Visual acuity: grossly normal, performs annual eye exam  ADLs: capable Fall risk: none Home safety: good Cognitive evaluation: intact to orientation, naming, recall and repetition EOL planning: adv directives discussed, full code/ I agree  I have personally reviewed and have noted 1. The patient's medical and social history - no updated today 2. Their use of alcohol, tobacco or illicit drugs 3. Their current medications and supplements 4. The patient's functional ability including ADL's, fall risks, home safety risks and hearing or visual impairment. 5. Diet and physical activities 6. Evidence for depression or mood disorders 7. Care team reviewed and updated (available in snapshot)  Review of Systems  Constitutional: Negative for fever, chills, activity change, appetite change and unexpected weight change.  HENT: Negative.   Respiratory: Negative for cough, chest tightness, shortness of breath and wheezing.   Cardiovascular: Negative for chest pain, palpitations and leg swelling.  Gastrointestinal: Negative for abdominal pain, diarrhea, constipation and abdominal distention.  Genitourinary: Negative for enuresis.  Musculoskeletal: Negative.   Skin: Negative.   Neurological: Negative.   Psychiatric/Behavioral: Negative.       Objective:   Physical Exam  Constitutional: He is oriented to person, place, and time. He appears well-developed and well-nourished.  HENT:  Head: Normocephalic and atraumatic.  Eyes: EOM are normal.  Neck: Normal range of motion.  Cardiovascular: Normal rate and regular rhythm.     Pulmonary/Chest: Effort normal and breath sounds normal. No respiratory distress. He has no wheezes.  Abdominal: Soft.  Musculoskeletal: He exhibits no edema.  Neurological: He is alert and oriented to person, place, and time.  Skin: Skin is warm and dry.   Filed Vitals:   03/24/15 1352  BP: 140/92  Pulse: 88  Temp: 98.5 F (36.9 C)  TempSrc: Oral  Resp: 14  Height: 5\' 6"  (1.676 m)  Weight: 181 lb 1.3 oz (82.137 kg)  SpO2: 97%      Assessment & Plan:

## 2015-03-26 NOTE — Assessment & Plan Note (Signed)
BP close to goal today. He is stable on his regimen of HCTZ 25 mg and lisinopril 40 mg and tamsulosin daily and will continue as his home readings are at goal. If his home BP start to go up he will call back. Check labs today and adjust regimen if needed.

## 2015-04-07 DIAGNOSIS — N138 Other obstructive and reflux uropathy: Secondary | ICD-10-CM | POA: Diagnosis not present

## 2015-04-07 DIAGNOSIS — N401 Enlarged prostate with lower urinary tract symptoms: Secondary | ICD-10-CM | POA: Diagnosis not present

## 2015-04-07 DIAGNOSIS — N5201 Erectile dysfunction due to arterial insufficiency: Secondary | ICD-10-CM | POA: Diagnosis not present

## 2015-06-09 ENCOUNTER — Telehealth: Payer: Self-pay | Admitting: Internal Medicine

## 2015-06-09 NOTE — Telephone Encounter (Signed)
Patient Name: Christian Dodson  DOB: [birth year]    Initial Comment caller states he has tick bites on his legs   Nurse Assessment  Nurse: Leilani Merl, RN, Heather Date/Time (Eastern Time): 06/09/2015 12:33:20 PM  Confirm and document reason for call. If symptomatic, describe symptoms. ---caller states he has tick bites on the backs of his legs, there are about 4 on the backs of his legs. He did not have to pull any off, non were bitten in. He just saw ticks on his leg but none at the areas that he is calling about.  Has the patient traveled out of the country within the last 30 days? ---Not Applicable  Does the patient require triage? ---Yes  Related visit to physician within the last 2 weeks? ---No  Does the PT have any chronic conditions? (i.e. diabetes, asthma, etc.) ---Yes  List chronic conditions. ---HTN     Guidelines    Guideline Title Affirmed Question Affirmed Notes  Tick Bite Tick bite with no complications (all triage questions negative)    Final Disposition User   [facility], RN, [employer]

## 2015-07-03 DIAGNOSIS — D3132 Benign neoplasm of left choroid: Secondary | ICD-10-CM | POA: Diagnosis not present

## 2015-07-03 DIAGNOSIS — H2513 Age-related nuclear cataract, bilateral: Secondary | ICD-10-CM | POA: Diagnosis not present

## 2015-09-30 DIAGNOSIS — Z23 Encounter for immunization: Secondary | ICD-10-CM | POA: Diagnosis not present

## 2016-02-25 ENCOUNTER — Other Ambulatory Visit: Payer: Self-pay

## 2016-02-25 MED ORDER — HYDROCHLOROTHIAZIDE 25 MG PO TABS: 25.0000 mg | ORAL_TABLET | Freq: Every day | ORAL | Status: DC

## 2016-02-25 MED ORDER — LISINOPRIL 40 MG PO TABS: 40.0000 mg | ORAL_TABLET | Freq: Every day | ORAL | Status: DC

## 2016-03-10 ENCOUNTER — Telehealth: Payer: Self-pay

## 2016-03-10 NOTE — Telephone Encounter (Signed)
Recd faxed rx refill request for finasteride 5mg  tab [facility]----last OV addressing bph was April/2016---looks like he has cancelled appt for April/2017----are you ok with refilling?---please advise, thanks

## 2016-03-11 ENCOUNTER — Other Ambulatory Visit: Payer: Self-pay | Admitting: Internal Medicine

## 2016-03-11 MED ORDER — FINASTERIDE 5 MG PO TABS: 5.0000 mg | ORAL_TABLET | Freq: Every day | ORAL | Status: DC

## 2016-03-11 NOTE — Addendum Note (Signed)
Addended by: [name] A on: 03/11/2016 07:59 AM   Modules accepted: Orders

## 2016-03-11 NOTE — Telephone Encounter (Signed)
Sent in

## 2016-03-24 ENCOUNTER — Ambulatory Visit (INDEPENDENT_AMBULATORY_CARE_PROVIDER_SITE_OTHER): Payer: Medicare Other

## 2016-03-24 ENCOUNTER — Telehealth: Payer: Self-pay

## 2016-03-24 VITALS — BP 150/100 | Ht 66.0 in | Wt 184.8 lb

## 2016-03-24 DIAGNOSIS — Z23 Encounter for immunization: Secondary | ICD-10-CM | POA: Diagnosis not present

## 2016-03-24 DIAGNOSIS — Z0000 Encounter for general adult medical examination without abnormal findings: Secondary | ICD-10-CM | POA: Diagnosis not present

## 2016-03-24 DIAGNOSIS — N400 Enlarged prostate without lower urinary tract symptoms: Secondary | ICD-10-CM

## 2016-03-24 DIAGNOSIS — I10 Essential (primary) hypertension: Secondary | ICD-10-CM

## 2016-03-24 NOTE — Progress Notes (Addendum)
Subjective:   Christian Dodson is a 70 y.o. male who presents for Medicare Annual/Subsequent preventive examination.  Review of Systems:  HRA assessment completed during visit; [place]  The Patient was informed that this wellness visit is to identify risk and educate on how to reduce risk for increase disease through lifestyle changes.   ROS deferred to CPE exam with physician on 4/18; with Dr. Sharlet Salina Would like to have labs drawn by Monday am the 10th for visit the 18th with Dr. Sharlet Salina Also would like to evaluate hernia at [place] and family hx Both parents lived to be mid 83's; grandparents lived late 11's  Mother was borderline DM   Patient is only child   Medical issues  Issues with left hip; OA; considering surgery in the future (1-10 extreme pain; 1-4 most days)  Feels it pops sometime  Had Partial right knee replacement on the right; doing well ;  no issues at present GERD; otc omeprazole takes one time per day HTN (Lipids 03/2015 cho 188; trig 388; HDL 33; LDL 105) A1c 5.4 [facility] and it is 130/80;  BPH  Educated regarding Triglycerides elevation; Denies ETOH but drinks some; Was educated on triglycerides and will recheck; discussed medication if this continues to be elevated and given information regarding low cholesterol diet;  Controllable  risk for heart disease reviewed for goal setting: Includes; family hx; HTN; decreased renal function; obesity; sedentary lifestyle Educated regarding Metabolic syndrome / Factors that increase risk for Heart Disease and discussed risk as given:  Excess body fat around the waist Triglycerides > 150 HDL < 50 BP > 130/85 Glucose > 100 Goals are determined by the physician and based on other relevant data and risk Also discussed low sodium diet as BP elevated today 150/100; States this is not his norm and that his BP will generally run 130/80 when checked at [facility];  Instructed to check his BP several times prior to his fup with  Dr. Sharlet Salina;  Also noted the sausage links in the am and other foods that may be increasing his BP.   Tobacco; Never smoked ETOH: Occasional beer/   BMI: 29 (would like to get down to 170lbs- was this weight x 5 years)  Diet;  Breakfast; [place] sausages (5) low fat; scrambled eggs;  Lunch; left overs;  Supper: cut back on red meat; eat more chicken, fish Grills; bakes Eats out 2 to 3 times a week;   Exercise; Plays golf 3 times a week;  Water aerobics 2 to 3 times a week with wife Some issues with hip (left)  Used to wrestle;  Can drop weight fast;  [facility] [employer];  Exercises 4 days a week; 60 min; mod   SAFETY; one level; bonus room upstairs / moved x 70 yo;  Dtr lives in [place]; [place]; [place] and 2yo;  Son is in [place]; One walk in shower; 2 tub showers;   Community safety; yes Smoke detectors yes Firearms safety / to keep them in a safe place if they exist  Driving accidents and seatbelt/ no; does wear seatbelts Sun protection/ sunscreen yes because Father had fair skin  Stressors 1-5; 0 stress currently   Medication review/ no medication issues  Fall assessment / no  Mobilization and Functional losses in the last year/ none reported Sleep patterns/ sleeps well 6-8 hours    Counseling: Hep C/ served in [employer]; will defer to CPE on the 18th of April Colonoscopy; 12/2008/ 09/2020 per  epic (patient states colonoscopy 2011)  EKG: 03/2013 Dexa 01/2009 -0.4 (normal per report) Taking Vit D;  Hearing: 2000hz  in right; < 2000 in left / worked in [employer] tried hearing aid x 1 but accumulated wax and did not wear it long.   Ophthalmology exam; last summer 2016; has a spot on eye they are monitoring;  Small cataracts   Immunizations  Had flu shot at pharmacy and was reported Also took Prevnar 13 today;   Advanced Directive; yes  Health advice or referrals   Current Care Team reviewed and updated    Cardiac Risk Factors include: advanced age (>4men,  >47 women);dyslipidemia;hypertension     Objective:    Vitals: BP 150/100 mmHg  Ht 5\' 6"  (1.676 m)  Wt 184 lb 12 oz (83.802 kg)  BMI 29.83 kg/m2  Body mass index is 29.83 kg/(m^2).  Tobacco History  Smoking status  . Never Smoker   Smokeless tobacco  . Never Used     Counseling given: Yes   Past Medical History  Diagnosis Date  . GERD (gastroesophageal reflux disease)   . Hypertension   . HTN (hypertension) 10/19/2013  . BPH (benign prostatic hyperplasia) 10/19/2013   No past surgical history on file. Family History  Problem Relation Age of Onset  . Hypertension Mother   . Cancer Father     lung  . Hyperlipidemia Maternal Grandfather   . Stroke Paternal Grandmother   . Hyperlipidemia Paternal Grandfather   . Diabetes Neg Hx    History  Sexual Activity  . Sexual Activity: Yes    Outpatient Encounter Prescriptions as of 03/24/2016  Medication Sig  . calcium carbonate (OS-CAL) 600 MG TABS tablet Take 600 mg by mouth 2 (two) times daily with a meal.  . finasteride (PROSCAR) 5 MG tablet Take 1 tablet (5 mg total) by mouth daily.  . Glucosamine-Chondroitin 250-200 MG TABS Take by mouth 2 (two) times daily.  . hydrochlorothiazide (HYDRODIURIL) 25 MG tablet Take 1 tablet (25 mg total) by mouth daily. ---patient needs office visit before any further refills  . lisinopril (PRINIVIL,ZESTRIL) 40 MG tablet Take 1 tablet (40 mg total) by mouth daily. ---patient needs office visit before any further refills  . Multiple Vitamins-Minerals (CENTRUM SILVER ULTRA MENS) TABS Take by mouth daily.  Marland Kitchen omeprazole (PRILOSEC) 20 MG capsule Take 20 mg by mouth daily.  . tamsulosin (FLOMAX) 0.4 MG CAPS capsule Take 1 capsule (0.4 mg total) by mouth daily. (Patient not taking: Reported on 03/24/2016)   No facility-administered encounter medications on file as of 03/24/2016.    Activities of Daily Living In your present state of health, do you have any difficulty performing the following  activities: 03/24/2016  Hearing? Y  Vision? N  Difficulty concentrating or making decisions? N  Walking or climbing stairs? Y  Dressing or bathing? N  Doing errands, shopping? N  Preparing Food and eating ? N  Using the Toilet? N  In the past six months, have you accidently leaked urine? N  Do you have problems with loss of bowel control? N  Managing your Medications? N  Managing your Finances? N  Housekeeping or managing your Housekeeping? N    Patient Care Team: Hoyt Koch, MD as PCP - General (Internal Medicine)   Assessment:     Exercise Activities and Dietary recommendations Current Exercise Habits: Structured exercise class (and plays golf), Time (Minutes): 60, Frequency (Times/Week): 4, Weekly Exercise (Minutes/Week): 240, Intensity: Moderate  Goals    . Weight <  180 lb (81.647 kg)     Will start walking again; 5 days as tolerated;        Fall Risk Fall Risk  03/24/2016 10/19/2013  Falls in the past year? No No   Depression Screen PHQ 2/9 Scores 03/24/2016 10/19/2013  PHQ - 2 Score 0 0    Cognitive Testing No flowsheet data found.   Ad8 score 0  Immunization History  Administered Date(s) Administered  . DT 12/21/2011  . Influenza, High Dose Seasonal PF 09/19/2013  . Influenza,inj,Quad PF,36+ Mos 09/03/2014  . Influenza-Unspecified 08/21/2015  . Pneumococcal Conjugate-13 03/24/2016  . Pneumococcal Polysaccharide-23 03/20/2013  . Tdap 10/03/2012  . Zoster 09/03/2014   Screening Tests Health Maintenance  Topic Date Due  . Hepatitis C Screening  1946-10-29  . PNA vac Low Risk Adult (1 of 2 - PCV13) 03/20/2014  . INFLUENZA VACCINE  07/20/2016  . COLONOSCOPY  09/25/2020  . TETANUS/TDAP  10/03/2022  . ZOSTAVAX  Completed      Plan:     Took Prevnar 13 today  Will discuss Vit d level; osteopenia of -0.4 in 2010;  Requesting labs to be drawn prior to CPE; last labs draw was 4/4/ 2016  During the course of the visit the patient was educated  and counseled about the following appropriate screening and preventive services:   Vaccines to include Pneumoccal, Influenza, Hepatitis B, Td, Zostavax, HCV  Had flu shot at pharmacy last fall; prevnar given today  Electrocardiogram/ 03/2013  Cardiovascular Disease/ BP elevated today 150/100; states this is not normal; as [facility]/ [facility]; educated on cutting back sodium and to check at least 4 times prior to his visit with Dr. Sharlet Salina on the 18th   Colorectal cancer screening / due 09/2020 in epic  Diabetes screening/ na/  Prostate Cancer Screening; would like psa drawn so [facility] will not have to do another lab draw on him.   Glaucoma screening/ last eye exam was summer 2016; annual exams  Nutrition counseling / given information on low sodium and to monitor BP to see if he is sodium sensitive   Smoking cessation counseling /n/a  Also discuss elevated triglycerides last year and educated regarding impact of ETOH and cholesterol;   Patient Instructions (the written plan) was given to the patient.    [id number], RN  03/24/2016   Medical screening examination/treatment/procedure(s) were performed by non-physician practitioner and as supervising provider I was immediately available for consultation/collaboration. I agree with above. Mauricio Po, FNP

## 2016-03-24 NOTE — Patient Instructions (Addendum)
Mr. Rutkoski , Thank you for taking time to come for your Medicare Wellness Visit. I appreciate your ongoing commitment to your health goals. Please review the following plan we discussed and let me know if I can assist you in the future.   Will take Prevnar;  Dr. Sharlet Salina may check Vit D    These are the goals we discussed: Goals    . Weight < 180 lb (81.647 kg)     Will start walking again; 5 days as tolerated;         This is a list of the screening recommended for you and due dates:  Health Maintenance  Topic Date Due  .  Hepatitis C: One time screening is recommended by Center for Disease Control  (CDC) for  adults born from [birth year] through 1963.   31-Jul-1946  . Pneumonia vaccines (1 of 2 - PCV13) 03/20/2014  . Flu Shot  07/20/2016  . Colon Cancer Screening  09/25/2020  . Tetanus Vaccine  10/03/2022  . Shingles Vaccine  Completed   Low-Sodium Eating Plan Sodium raises blood pressure and causes water to be held in the body. Getting less sodium from food will help lower your blood pressure, reduce any swelling, and protect your heart, liver, and kidneys. We get sodium by adding salt (sodium chloride) to food. Most of our sodium comes from canned, boxed, and frozen foods. Restaurant foods, fast foods, and pizza are also very high in sodium. Even if you take medicine to lower your blood pressure or to reduce fluid in your body, getting less sodium from your food is important. WHAT IS MY PLAN? Most people should limit their sodium intake to 2,300 mg a day. Your health care provider recommends that you limit your sodium intake to __________ a day.  WHAT DO I NEED TO KNOW ABOUT THIS EATING PLAN? For the low-sodium eating plan, you will follow these general guidelines:  Choose foods with a % Daily Value for sodium of less than 5% (as listed on the food label).   Use salt-free seasonings or herbs instead of table salt or sea salt.   Check with your health care provider or pharmacist  before using salt substitutes.   Eat fresh foods.  Eat more vegetables and fruits.  Limit canned vegetables. If you do use them, rinse them well to decrease the sodium.   Limit cheese to 1 oz (28 g) per day.   Eat lower-sodium products, often labeled as "lower sodium" or "no salt added."  Avoid foods that contain monosodium glutamate (MSG). MSG is sometimes added to [place] food and some canned foods.  Check food labels (Nutrition Facts labels) on foods to learn how much sodium is in one serving.  Eat more home-cooked food and less restaurant, buffet, and fast food.  When eating at a restaurant, ask that your food be prepared with less salt, or no salt if possible.  HOW DO I READ FOOD LABELS FOR SODIUM INFORMATION? The Nutrition Facts label lists the amount of sodium in one serving of the food. If you eat more than one serving, you must multiply the listed amount of sodium by the number of servings. Food labels may also identify foods as:  Sodium free--Less than 5 mg in a serving.  Very low sodium--35 mg or less in a serving.  Low sodium--140 mg or less in a serving.  Light in sodium--50% less sodium in a serving. For example, if a food that usually has 300 mg of sodium is  changed to become light in sodium, it will have 150 mg of sodium.  Reduced sodium--25% less sodium in a serving. For example, if a food that usually has 400 mg of sodium is changed to reduced sodium, it will have 300 mg of sodium. WHAT FOODS CAN I EAT? Grains Low-sodium cereals, including oats, puffed wheat and rice, and shredded wheat cereals. Low-sodium crackers. Unsalted rice and pasta. Lower-sodium bread.  Vegetables Frozen or fresh vegetables. Low-sodium or reduced-sodium canned vegetables. Low-sodium or reduced-sodium tomato sauce and paste. Low-sodium or reduced-sodium tomato and vegetable juices.  Fruits Fresh, frozen, and canned fruit. Fruit juice.  Meat and Other Protein  Products Low-sodium canned tuna and salmon. Fresh or frozen meat, poultry, seafood, and fish. Lamb. Unsalted nuts. Dried beans, peas, and lentils without added salt. Unsalted canned beans. Homemade soups without salt. Eggs.  Dairy Milk. Soy milk. Ricotta cheese. Low-sodium or reduced-sodium cheeses. Yogurt.  Condiments Fresh and dried herbs and spices. Salt-free seasonings. Onion and garlic powders. Low-sodium varieties of mustard and ketchup. Fresh or refrigerated horseradish. Lemon juice.  Fats and Oils Reduced-sodium salad dressings. Unsalted butter.  Other Unsalted popcorn and pretzels.  The items listed above may not be a complete list of recommended foods or beverages. Contact your dietitian for more options. WHAT FOODS ARE NOT RECOMMENDED? Grains Instant hot cereals. Bread stuffing, pancake, and biscuit mixes. Croutons. Seasoned rice or pasta mixes. Noodle soup cups. Boxed or frozen macaroni and cheese. Self-rising flour. Regular salted crackers. Vegetables Regular canned vegetables. Regular canned tomato sauce and paste. Regular tomato and vegetable juices. Frozen vegetables in sauces. Salted [place] fries. Olives. Angie Fava. Relishes. Sauerkraut. Salsa. Meat and Other Protein Products Salted, canned, smoked, spiced, or pickled meats, seafood, or fish. Bacon, ham, sausage, hot dogs, corned beef, chipped beef, and packaged luncheon meats. Salt pork. Jerky. Pickled herring. Anchovies, regular canned tuna, and sardines. Salted nuts. Dairy Processed cheese and cheese spreads. Cheese curds. Blue cheese and cottage cheese. Buttermilk.  Condiments Onion and garlic salt, seasoned salt, table salt, and sea salt. Canned and packaged gravies. Worcestershire sauce. Tartar sauce. Barbecue sauce. Teriyaki sauce. Soy sauce, including reduced sodium. Steak sauce. Fish sauce. Oyster sauce. Cocktail sauce. Horseradish that you find on the shelf. Regular ketchup and mustard. Meat flavorings and  tenderizers. Bouillon cubes. Hot sauce. Tabasco sauce. Marinades. Taco seasonings. Relishes. Fats and Oils Regular salad dressings. Salted butter. Margarine. Ghee. Bacon fat.  Other Potato and tortilla chips. Corn chips and puffs. Salted popcorn and pretzels. Canned or dried soups. Pizza. Frozen entrees and pot pies.  The items listed above may not be a complete list of foods and beverages to avoid. Contact your dietitian for more information.   This information is not intended to replace advice given to you by your health care provider. Make sure you discuss any questions you have with your health care provider.   Document Released: 05/28/2002 Document Revised: 12/27/2014 Document Reviewed: 10/10/2013 Elsevier Interactive Patient Education 2016 [employer]   Fat and Cholesterol Restricted Diet High levels of fat and cholesterol in your blood may lead to various health problems, such as diseases of the heart, blood vessels, gallbladder, liver, and pancreas. Fats are concentrated sources of energy that come in various forms. Certain types of fat, including saturated fat, may be harmful in excess. Cholesterol is a substance needed by your body in small amounts. Your body makes all the cholesterol it needs. Excess cholesterol comes from the food you eat. When you have high levels of cholesterol and  saturated fat in your blood, health problems can develop because the excess fat and cholesterol will gather along the walls of your blood vessels, causing them to narrow. Choosing the right foods will help you control your intake of fat and cholesterol. This will help keep the levels of these substances in your blood within normal limits and reduce your risk of disease. WHAT IS MY PLAN? Your health care provider recommends that you:  Get no more than __________ % of the total calories in your daily diet from fat.  Limit your intake of saturated fat to less than ______% of your total calories each  day.  Limit the amount of cholesterol in your diet to less than _________mg per day. WHAT TYPES OF FAT SHOULD I CHOOSE?  Choose healthy fats more often. Choose monounsaturated and polyunsaturated fats, such as olive and canola oil, flaxseeds, walnuts, almonds, and seeds.  Eat more omega-3 fats. Good choices include salmon, mackerel, sardines, tuna, flaxseed oil, and ground flaxseeds. Aim to eat fish at least two times a week.  Limit saturated fats. Saturated fats are primarily found in animal products, such as meats, butter, and cream. Plant sources of saturated fats include palm oil, palm kernel oil, and coconut oil.  Avoid foods with partially hydrogenated oils in them. These contain trans fats. Examples of foods that contain trans fats are stick margarine, some tub margarines, cookies, crackers, and other baked goods. WHAT GENERAL GUIDELINES DO I NEED TO FOLLOW? These guidelines for healthy eating will help you control your intake of fat and cholesterol:  Check food labels carefully to identify foods with trans fats or high amounts of saturated fat.  Fill one half of your plate with vegetables and green salads.  Fill one fourth of your plate with whole grains. Look for the word "whole" as the first word in the ingredient list.  Fill one fourth of your plate with lean protein foods.  Limit fruit to two servings a day. Choose fruit instead of juice.  Eat more foods that contain soluble fiber. Examples of foods that contain this type of fiber are apples, broccoli, carrots, beans, peas, and barley. Aim to get 20-30 g of fiber per day.  Eat more home-cooked food and less restaurant, buffet, and fast food.  Limit or avoid alcohol.  Limit foods high in starch and sugar.  Limit fried foods.  Cook foods using methods other than frying. Baking, boiling, grilling, and broiling are all great options.  Lose weight if you are overweight. Losing just 5-10% of your initial body weight can  help your overall health and prevent diseases such as diabetes and heart disease. WHAT FOODS CAN I EAT? Grains Whole grains, such as whole wheat or whole grain breads, crackers, cereals, and pasta. Unsweetened oatmeal, bulgur, barley, quinoa, or brown rice. Corn or whole wheat flour tortillas. Vegetables Fresh or frozen vegetables (raw, steamed, roasted, or grilled). Green salads. Fruits All fresh, canned (in natural juice), or frozen fruits. Meat and Other Protein Products Ground beef (85% or leaner), grass-fed beef, or beef trimmed of fat. Skinless chicken or [place]. Ground chicken or [place]. Pork trimmed of fat. All fish and seafood. Eggs. Dried beans, peas, or lentils. Unsalted nuts or seeds. Unsalted canned or dry beans. Dairy Low-fat dairy products, such as skim or 1% milk, 2% or reduced-fat cheeses, low-fat ricotta or cottage cheese, or plain low-fat yogurt. Fats and Oils Tub margarines without trans fats. Light or reduced-fat mayonnaise and salad dressings. Avocado. Olive, canola, sesame, or  safflower oils. Natural peanut or almond butter (choose ones without added sugar and oil). The items listed above may not be a complete list of recommended foods or beverages. Contact your dietitian for more options. WHAT FOODS ARE NOT RECOMMENDED? Grains White bread. White pasta. White rice. Cornbread. Bagels, pastries, and croissants. Crackers that contain trans fat. Vegetables White potatoes. Corn. Creamed or fried vegetables. Vegetables in a cheese sauce. Fruits Dried fruits. Canned fruit in light or heavy syrup. Fruit juice. Meat and Other Protein Products Fatty cuts of meat. Ribs, chicken wings, bacon, sausage, bologna, salami, chitterlings, fatback, hot dogs, bratwurst, and packaged luncheon meats. Liver and organ meats. Dairy Whole or 2% milk, cream, half-and-half, and cream cheese. Whole milk cheeses. Whole-fat or sweetened yogurt. Full-fat cheeses. Nondairy creamers and whipped  toppings. Processed cheese, cheese spreads, or cheese curds. Sweets and Desserts Corn syrup, sugars, honey, and molasses. Candy. Jam and jelly. Syrup. Sweetened cereals. Cookies, pies, cakes, donuts, muffins, and ice cream. Fats and Oils Butter, stick margarine, lard, shortening, ghee, or bacon fat. Coconut, palm kernel, or palm oils. Beverages Alcohol. Sweetened drinks (such as sodas, lemonade, and fruit drinks or punches). The items listed above may not be a complete list of foods and beverages to avoid. Contact your dietitian for more information.   This information is not intended to replace advice given to you by your health care provider. Make sure you discuss any questions you have with your health care provider.   Document Released: 12/06/2005 Document Revised: 12/27/2014 Document Reviewed: 03/06/2014 Elsevier Interactive Patient Education 2016 [place] in the Home  Falls can cause injuries. They can happen to people of all ages. There are many things you can do to make your home safe and to help prevent falls.  WHAT CAN I DO ON THE OUTSIDE OF MY HOME?  Regularly fix the edges of walkways and driveways and fix any cracks.  Remove anything that might make you trip as you walk through a door, such as a raised step or threshold.  Trim any bushes or trees on the path to your home.  Use bright outdoor lighting.  Clear any walking paths of anything that might make someone trip, such as rocks or tools.  Regularly check to see if handrails are loose or broken. Make sure that both sides of any steps have handrails.  Any raised decks and porches should have guardrails on the edges.  Have any leaves, snow, or ice cleared regularly.  Use sand or salt on walking paths during winter.  Clean up any spills in your garage right away. This includes oil or grease spills. WHAT CAN I DO IN THE BATHROOM?   Use night lights.  Install grab bars by the toilet and in the  tub and shower. Do not use towel bars as grab bars.  Use non-skid mats or decals in the tub or shower.  If you need to sit down in the shower, use a plastic, non-slip stool.  Keep the floor dry. Clean up any water that spills on the floor as soon as it happens.  Remove soap buildup in the tub or shower regularly.  Attach bath mats securely with double-sided non-slip rug tape.  Do not have throw rugs and other things on the floor that can make you trip. WHAT CAN I DO IN THE BEDROOM?  Use night lights.  Make sure that you have a light by your bed that is easy to reach.  Do not use any  sheets or blankets that are too big for your bed. They should not hang down onto the floor.  Have a firm chair that has side arms. You can use this for support while you get dressed.  Do not have throw rugs and other things on the floor that can make you trip. WHAT CAN I DO IN THE KITCHEN?  Clean up any spills right away.  Avoid walking on wet floors.  Keep items that you use a lot in easy-to-reach places.  If you need to reach something above you, use a strong step stool that has a grab bar.  Keep electrical cords out of the way.  Do not use floor polish or wax that makes floors slippery. If you must use wax, use non-skid floor wax.  Do not have throw rugs and other things on the floor that can make you trip. WHAT CAN I DO WITH MY STAIRS?  Do not leave any items on the stairs.  Make sure that there are handrails on both sides of the stairs and use them. Fix handrails that are broken or loose. Make sure that handrails are as long as the stairways.  Check any carpeting to make sure that it is firmly attached to the stairs. Fix any carpet that is loose or worn.  Avoid having throw rugs at the top or bottom of the stairs. If you do have throw rugs, attach them to the floor with carpet tape.  Make sure that you have a light switch at the top of the stairs and the bottom of the stairs. If you  do not have them, ask someone to add them for you. WHAT ELSE CAN I DO TO HELP PREVENT FALLS?  Wear shoes that:  Do not have high heels.  Have rubber bottoms.  Are comfortable and fit you well.  Are closed at the toe. Do not wear sandals.  If you use a stepladder:  Make sure that it is fully opened. Do not climb a closed stepladder.  Make sure that both sides of the stepladder are locked into place.  Ask someone to hold it for you, if possible.  Clearly mark and make sure that you can see:  Any grab bars or handrails.  First and last steps.  Where the edge of each step is.  Use tools that help you move around (mobility aids) if they are needed. These include:  Canes.  Walkers.  Scooters.  Crutches.  Turn on the lights when you go into a dark area. Replace any light bulbs as soon as they burn out.  Set up your furniture so you have a clear path. Avoid moving your furniture around.  If any of your floors are uneven, fix them.  If there are any pets around you, be aware of where they are.  Review your medicines with your doctor. Some medicines can make you feel dizzy. This can increase your chance of falling. Ask your doctor what other things that you can do to help prevent falls.   This information is not intended to replace advice given to you by your health care provider. Make sure you discuss any questions you have with your health care provider.   Document Released: 10/02/2009 Document Revised: 04/22/2015 Document Reviewed: 01/10/2015 Elsevier Interactive Patient Education 2016 [place] Maintenance, Male A healthy lifestyle and preventative care can promote health and wellness.  Maintain regular health, dental, and eye exams.  Eat a healthy diet. Foods like vegetables, fruits, whole grains,  low-fat dairy products, and lean protein foods contain the nutrients you need and are low in calories. Decrease your intake of foods high in solid fats,  added sugars, and salt. Get information about a proper diet from your health care provider, if necessary.  Regular physical exercise is one of the most important things you can do for your health. Most adults should get at least 150 minutes of moderate-intensity exercise (any activity that increases your heart rate and causes you to sweat) each week. In addition, most adults need muscle-strengthening exercises on 2 or more days a week.   Maintain a healthy weight. The body mass index (BMI) is a screening tool to identify possible weight problems. It provides an estimate of body fat based on height and weight. Your health care provider can find your BMI and can help you achieve or maintain a healthy weight. For males 20 years and older:  A BMI below 18.5 is considered underweight.  A BMI of 18.5 to 24.9 is normal.  A BMI of 25 to 29.9 is considered overweight.  A BMI of 30 and above is considered obese.  Maintain normal blood lipids and cholesterol by exercising and minimizing your intake of saturated fat. Eat a balanced diet with plenty of fruits and vegetables. Blood tests for lipids and cholesterol should begin at age 66 and be repeated every 5 years. If your lipid or cholesterol levels are high, you are over age 11, or you are at high risk for heart disease, you may need your cholesterol levels checked more frequently.Ongoing high lipid and cholesterol levels should be treated with medicines if diet and exercise are not working.  If you smoke, find out from your health care provider how to quit. If you do not use tobacco, do not start.  Lung cancer screening is recommended for adults aged 41-80 years who are at high risk for developing lung cancer because of a history of smoking. A yearly low-dose CT scan of the lungs is recommended for people who have at least a 30-pack-year history of smoking and are current smokers or have quit within the past 15 years. A pack year of smoking is smoking an  average of 1 pack of cigarettes a day for 1 year (for example, a 30-pack-year history of smoking could mean smoking 1 pack a day for 30 years or 2 packs a day for 15 years). Yearly screening should continue until the smoker has stopped smoking for at least 15 years. Yearly screening should be stopped for people who develop a health problem that would prevent them from having lung cancer treatment.  If you choose to drink alcohol, do not have more than 2 drinks per day. One drink is considered to be 12 oz (360 mL) of beer, 5 oz (150 mL) of wine, or 1.5 oz (45 mL) of liquor.  Avoid the use of street drugs. Do not share needles with anyone. Ask for help if you need support or instructions about stopping the use of drugs.  High blood pressure causes heart disease and increases the risk of stroke. High blood pressure is more likely to develop in:  People who have blood pressure in the end of the normal range (100-139/85-89 mm Hg).  People who are overweight or obese.  People who are African American.  If you are 1-77 years of age, have your blood pressure checked every 3-5 years. If you are 55 years of age or older, have your blood pressure checked every year. You  should have your blood pressure measured twice--once when you are at a hospital or clinic, and once when you are not at a hospital or clinic. Record the average of the two measurements. To check your blood pressure when you are not at a hospital or clinic, you can use:  An automated blood pressure machine at a pharmacy.  A home blood pressure monitor.  If you are 61-60 years old, ask your health care provider if you should take aspirin to prevent heart disease.  Diabetes screening involves taking a blood sample to check your fasting blood sugar level. This should be done once every 3 years after age 1 if you are at a normal weight and without risk factors for diabetes. Testing should be considered at a younger age or be carried out more  frequently if you are overweight and have at least 1 risk factor for diabetes.  Colorectal cancer can be detected and often prevented. Most routine colorectal cancer screening begins at the age of 17 and continues through age 27. However, your health care provider may recommend screening at an earlier age if you have risk factors for colon cancer. On a yearly basis, your health care provider may provide home test kits to check for hidden blood in the stool. A small camera at the end of a tube may be used to directly examine the colon (sigmoidoscopy or colonoscopy) to detect the earliest forms of colorectal cancer. Talk to your health care provider about this at age 72 when routine screening begins. A direct exam of the colon should be repeated every 5-10 years through age 39, unless early forms of precancerous polyps or small growths are found.  People who are at an increased risk for hepatitis B should be screened for this virus. You are considered at high risk for hepatitis B if:  You were born in a country where hepatitis B occurs often. Talk with your health care provider about which countries are considered high risk.  Your parents were born in a high-risk country and you have not received a shot to protect against hepatitis B (hepatitis B vaccine).  You have HIV or AIDS.  You use needles to inject street drugs.  You live with, or have sex with, someone who has hepatitis B.  You are a man who has sex with other men (MSM).  You get hemodialysis treatment.  You take certain medicines for conditions like cancer, organ transplantation, and autoimmune conditions.  Hepatitis C blood testing is recommended for all people born from [birth year] through 1963 and any individual with known risk factors for hepatitis C.  Healthy men should no longer receive prostate-specific antigen (PSA) blood tests as part of routine cancer screening. Talk to your health care provider about prostate cancer  screening.  Testicular cancer screening is not recommended for adolescents or adult males who have no symptoms. Screening includes self-exam, a health care provider exam, and other screening tests. Consult with your health care provider about any symptoms you have or any concerns you have about testicular cancer.  Practice safe sex. Use condoms and avoid high-risk sexual practices to reduce the spread of sexually transmitted infections (STIs).  You should be screened for STIs, including gonorrhea and chlamydia if:  You are sexually active and are younger than 24 years.  You are older than 24 years, and your health care provider tells you that you are at risk for this type of infection.  Your sexual activity has changed since you were last  screened, and you are at an increased risk for chlamydia or gonorrhea. Ask your health care provider if you are at risk.  If you are at risk of being infected with HIV, it is recommended that you take a prescription medicine daily to prevent HIV infection. This is called pre-exposure prophylaxis (PrEP). You are considered at risk if:  You are a man who has sex with other men (MSM).  You are a heterosexual man who is sexually active with multiple partners.  You take drugs by injection.  You are sexually active with a partner who has HIV.  Talk with your health care provider about whether you are at high risk of being infected with HIV. If you choose to begin PrEP, you should first be tested for HIV. You should then be tested every 3 months for as long as you are taking PrEP.  Use sunscreen. Apply sunscreen liberally and repeatedly throughout the day. You should seek shade when your shadow is shorter than you. Protect yourself by wearing long sleeves, pants, a wide-brimmed hat, and sunglasses year round whenever you are outdoors.  Tell your health care provider of new moles or changes in moles, especially if there is a change in shape or color. Also, tell  your health care provider if a mole is larger than the size of a pencil eraser.  A one-time screening for abdominal aortic aneurysm (AAA) and surgical repair of large AAAs by ultrasound is recommended for men aged 58-75 years who are current or former smokers.  Stay current with your vaccines (immunizations).   This information is not intended to replace advice given to you by your health care provider. Make sure you discuss any questions you have with your health care provider.   Document Released: 06/03/2008 Document Revised: 12/27/2014 Document Reviewed: 05/03/2011 Elsevier Interactive Patient Education [employer].

## 2016-03-24 NOTE — Telephone Encounter (Signed)
The patient in for AWV; requesting lab draws this week or Monday am; He will be out of town until apt w Dr. Sharlet Salina on 4/18 States he would like his PSA drawn so he will not have to do another lab draw for Urology. 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

## 2016-03-24 NOTE — Telephone Encounter (Signed)
Blood work order placed and can be completed at his convenience. Please have him fast for a cholesterol check. PSA added for prostate cancer screen.

## 2016-03-25 NOTE — Telephone Encounter (Signed)
Call to let the patient know his labs are in the system and he can get them drawn anytime; must be NPO after MN; Left message with spouse.

## 2016-03-26 ENCOUNTER — Other Ambulatory Visit (INDEPENDENT_AMBULATORY_CARE_PROVIDER_SITE_OTHER): Payer: Medicare Other

## 2016-03-26 DIAGNOSIS — N400 Enlarged prostate without lower urinary tract symptoms: Secondary | ICD-10-CM

## 2016-03-26 DIAGNOSIS — I10 Essential (primary) hypertension: Secondary | ICD-10-CM | POA: Diagnosis not present

## 2016-03-26 LAB — CBC
HCT: 45.4 % (ref 39.0–52.0)
HEMOGLOBIN: 15.8 g/dL (ref 13.0–17.0)
MCHC: 34.7 g/dL (ref 30.0–36.0)
MCV: 91.9 fl (ref 78.0–100.0)
PLATELETS: 253 10*3/uL (ref 150.0–400.0)
RBC: 4.95 Mil/uL (ref 4.22–5.81)
RDW: 12.9 % (ref 11.5–15.5)
WBC: 8.4 10*3/uL (ref 4.0–10.5)

## 2016-03-26 LAB — COMPREHENSIVE METABOLIC PANEL
ALT: 33 U/L (ref 0–53)
AST: 17 U/L (ref 0–37)
Albumin: 4.7 g/dL (ref 3.5–5.2)
Alkaline Phosphatase: 68 U/L (ref 39–117)
BUN: 20 mg/dL (ref 6–23)
CALCIUM: 9.9 mg/dL (ref 8.4–10.5)
CHLORIDE: 101 meq/L (ref 96–112)
CO2: 30 meq/L (ref 19–32)
Creatinine, Ser: 1.12 mg/dL (ref 0.40–1.50)
GFR: 69.36 mL/min (ref >60.00)
Glucose, Bld: 108 mg/dL — ABNORMAL HIGH (ref 70–99)
POTASSIUM: 4 meq/L (ref 3.5–5.1)
Sodium: 138 mEq/L (ref 135–145)
Total Bilirubin: 0.7 mg/dL (ref 0.2–1.2)
Total Protein: 7.2 g/dL (ref 6.0–8.3)

## 2016-03-26 LAB — LIPID PANEL
CHOL/HDL RATIO: 5
Cholesterol: 172 mg/dL (ref 0–200)
HDL: 33.4 mg/dL — AB (ref >39.00)
NONHDL: 138.45
TRIGLYCERIDES: 235 mg/dL — AB (ref 0.0–149.0)
VLDL: 47 mg/dL — ABNORMAL HIGH (ref 0.0–40.0)

## 2016-03-26 LAB — LDL CHOLESTEROL, DIRECT: Direct LDL: 100 mg/dL

## 2016-03-26 LAB — PSA: PSA: 1.47 ng/mL (ref 0.10–4.00)

## 2016-03-28 ENCOUNTER — Encounter: Payer: Self-pay | Admitting: Family

## 2016-04-06 ENCOUNTER — Encounter: Payer: Self-pay | Admitting: Internal Medicine

## 2016-04-06 ENCOUNTER — Ambulatory Visit (INDEPENDENT_AMBULATORY_CARE_PROVIDER_SITE_OTHER): Payer: Medicare Other | Admitting: Internal Medicine

## 2016-04-06 VITALS — BP 146/76 | HR 86 | Temp 98.7°F | Resp 16 | Ht 66.0 in | Wt 186.0 lb

## 2016-04-06 DIAGNOSIS — K219 Gastro-esophageal reflux disease without esophagitis: Secondary | ICD-10-CM | POA: Diagnosis not present

## 2016-04-06 DIAGNOSIS — Z0000 Encounter for general adult medical examination without abnormal findings: Secondary | ICD-10-CM | POA: Diagnosis not present

## 2016-04-06 DIAGNOSIS — I10 Essential (primary) hypertension: Secondary | ICD-10-CM | POA: Diagnosis not present

## 2016-04-06 MED ORDER — HYDROCHLOROTHIAZIDE 25 MG PO TABS: 25.0000 mg | ORAL_TABLET | Freq: Every day | ORAL | Status: DC

## 2016-04-06 MED ORDER — LISINOPRIL 40 MG PO TABS: 40.0000 mg | ORAL_TABLET | Freq: Every day | ORAL | Status: DC

## 2016-04-06 NOTE — Progress Notes (Signed)
   Subjective:    Patient ID: Christian Dodson, male    DOB: 1946/11/25, 70 y.o.   MRN: [id number]  HPI Here for medicare wellness, no new complaints. Please see A/P for status and treatment of chronic medical problems.   Diet: heart healthy Physical activity: sedentary Depression/mood screen: negative Hearing: intact to whispered voice Visual acuity: grossly normal, performs annual eye exam  ADLs: capable Fall risk: none Home safety: good Cognitive evaluation: intact to orientation, naming, recall and repetition EOL planning: adv directives discussed  I have personally reviewed and have noted 1. The patient's medical and social history - reviewed today no changes 2. Their use of alcohol, tobacco or illicit drugs 3. Their current medications and supplements 4. The patient's functional ability including ADL's, fall risks, home safety risks and hearing or visual impairment. 5. Diet and physical activities 6. Evidence for depression or mood disorders 7. Care team reviewed and updated (available in snapshot)  Review of Systems  Constitutional: Negative for fever, chills, activity change, appetite change and unexpected weight change.  HENT: Negative.   Eyes: Negative.   Respiratory: Negative for cough, chest tightness, shortness of breath and wheezing.   Cardiovascular: Negative for chest pain, palpitations and leg swelling.  Gastrointestinal: Negative for abdominal pain, diarrhea, constipation and abdominal distention.  Musculoskeletal: Negative.   Skin: Negative.   Neurological: Negative.   Psychiatric/Behavioral: Negative.       Objective:   Physical Exam  Constitutional: He is oriented to person, place, and time. He appears well-developed and well-nourished.  HENT:  Head: Normocephalic and atraumatic.  Eyes: EOM are normal.  Neck: Normal range of motion.  Cardiovascular: Normal rate and regular rhythm.   Pulmonary/Chest: Effort normal and breath sounds normal. No respiratory  distress. He has no wheezes.  Abdominal: Soft. He exhibits no distension. There is no tenderness. There is no rebound.  Musculoskeletal: He exhibits no edema.  Neurological: He is alert and oriented to person, place, and time.  Skin: Skin is warm and dry.   Filed Vitals:   04/06/16 1538  BP: 146/76  Pulse: 86  Temp: 98.7 F (37.1 C)  TempSrc: Oral  Resp: 16  Height: 5\' 6"  (1.676 m)  Weight: 186 lb (84.369 kg)  SpO2: 98%      Assessment & Plan:

## 2016-04-06 NOTE — Patient Instructions (Signed)
Your labs are doing well and we will get the refills sent in for your medicines.   You are doing good so keep up the good work with the water aerobics to stay active.   Health Maintenance, Male A healthy lifestyle and preventative care can promote health and wellness.  Maintain regular health, dental, and eye exams.  Eat a healthy diet. Foods like vegetables, fruits, whole grains, low-fat dairy products, and lean protein foods contain the nutrients you need and are low in calories. Decrease your intake of foods high in solid fats, added sugars, and salt. Get information about a proper diet from your health care provider, if necessary.  Regular physical exercise is one of the most important things you can do for your health. Most adults should get at least 150 minutes of moderate-intensity exercise (any activity that increases your heart rate and causes you to sweat) each week. In addition, most adults need muscle-strengthening exercises on 2 or more days a week.   Maintain a healthy weight. The body mass index (BMI) is a screening tool to identify possible weight problems. It provides an estimate of body fat based on height and weight. Your health care provider can find your BMI and can help you achieve or maintain a healthy weight. For males 20 years and older:  A BMI below 18.5 is considered underweight.  A BMI of 18.5 to 24.9 is normal.  A BMI of 25 to 29.9 is considered overweight.  A BMI of 30 and above is considered obese.  Maintain normal blood lipids and cholesterol by exercising and minimizing your intake of saturated fat. Eat a balanced diet with plenty of fruits and vegetables. Blood tests for lipids and cholesterol should begin at age 19 and be repeated every 5 years. If your lipid or cholesterol levels are high, you are over age 28, or you are at high risk for heart disease, you may need your cholesterol levels checked more frequently.Ongoing high lipid and cholesterol levels  should be treated with medicines if diet and exercise are not working.  If you smoke, find out from your health care provider how to quit. If you do not use tobacco, do not start.  Lung cancer screening is recommended for adults aged 16-80 years who are at high risk for developing lung cancer because of a history of smoking. A yearly low-dose CT scan of the lungs is recommended for people who have at least a 30-pack-year history of smoking and are current smokers or have quit within the past 15 years. A pack year of smoking is smoking an average of 1 pack of cigarettes a day for 1 year (for example, a 30-pack-year history of smoking could mean smoking 1 pack a day for 30 years or 2 packs a day for 15 years). Yearly screening should continue until the smoker has stopped smoking for at least 15 years. Yearly screening should be stopped for people who develop a health problem that would prevent them from having lung cancer treatment.  If you choose to drink alcohol, do not have more than 2 drinks per day. One drink is considered to be 12 oz (360 mL) of beer, 5 oz (150 mL) of wine, or 1.5 oz (45 mL) of liquor.  Avoid the use of street drugs. Do not share needles with anyone. Ask for help if you need support or instructions about stopping the use of drugs.  High blood pressure causes heart disease and increases the risk of stroke. High blood  pressure is more likely to develop in:  People who have blood pressure in the end of the normal range (100-139/85-89 mm Hg).  People who are overweight or obese.  People who are African American.  If you are 53-62 years of age, have your blood pressure checked every 3-5 years. If you are 26 years of age or older, have your blood pressure checked every year. You should have your blood pressure measured twice--once when you are at a hospital or clinic, and once when you are not at a hospital or clinic. Record the average of the two measurements. To check your blood  pressure when you are not at a hospital or clinic, you can use:  An automated blood pressure machine at a pharmacy.  A home blood pressure monitor.  If you are 75-67 years old, ask your health care provider if you should take aspirin to prevent heart disease.  Diabetes screening involves taking a blood sample to check your fasting blood sugar level. This should be done once every 3 years after age 79 if you are at a normal weight and without risk factors for diabetes. Testing should be considered at a younger age or be carried out more frequently if you are overweight and have at least 1 risk factor for diabetes.  Colorectal cancer can be detected and often prevented. Most routine colorectal cancer screening begins at the age of 9 and continues through age 71. However, your health care provider may recommend screening at an earlier age if you have risk factors for colon cancer. On a yearly basis, your health care provider may provide home test kits to check for hidden blood in the stool. A small camera at the end of a tube may be used to directly examine the colon (sigmoidoscopy or colonoscopy) to detect the earliest forms of colorectal cancer. Talk to your health care provider about this at age 53 when routine screening begins. A direct exam of the colon should be repeated every 5-10 years through age 72, unless early forms of precancerous polyps or small growths are found.  People who are at an increased risk for hepatitis B should be screened for this virus. You are considered at high risk for hepatitis B if:  You were born in a country where hepatitis B occurs often. Talk with your health care provider about which countries are considered high risk.  Your parents were born in a high-risk country and you have not received a shot to protect against hepatitis B (hepatitis B vaccine).  You have HIV or AIDS.  You use needles to inject street drugs.  You live with, or have sex with, someone who  has hepatitis B.  You are a man who has sex with other men (MSM).  You get hemodialysis treatment.  You take certain medicines for conditions like cancer, organ transplantation, and autoimmune conditions.  Hepatitis C blood testing is recommended for all people born from [birth year] through 1965 and any individual with known risk factors for hepatitis C.  Healthy men should no longer receive prostate-specific antigen (PSA) blood tests as part of routine cancer screening. Talk to your health care provider about prostate cancer screening.  Testicular cancer screening is not recommended for adolescents or adult males who have no symptoms. Screening includes self-exam, a health care provider exam, and other screening tests. Consult with your health care provider about any symptoms you have or any concerns you have about testicular cancer.  Practice safe sex. Use condoms and avoid  high-risk sexual practices to reduce the spread of sexually transmitted infections (STIs).  You should be screened for STIs, including gonorrhea and chlamydia if:  You are sexually active and are younger than 24 years.  You are older than 24 years, and your health care provider tells you that you are at risk for this type of infection.  Your sexual activity has changed since you were last screened, and you are at an increased risk for chlamydia or gonorrhea. Ask your health care provider if you are at risk.  If you are at risk of being infected with HIV, it is recommended that you take a prescription medicine daily to prevent HIV infection. This is called pre-exposure prophylaxis (PrEP). You are considered at risk if:  You are a man who has sex with other men (MSM).  You are a heterosexual man who is sexually active with multiple partners.  You take drugs by injection.  You are sexually active with a partner who has HIV.  Talk with your health care provider about whether you are at high risk of being infected with  HIV. If you choose to begin PrEP, you should first be tested for HIV. You should then be tested every 3 months for as long as you are taking PrEP.  Use sunscreen. Apply sunscreen liberally and repeatedly throughout the day. You should seek shade when your shadow is shorter than you. Protect yourself by wearing long sleeves, pants, a wide-brimmed hat, and sunglasses year round whenever you are outdoors.  Tell your health care provider of new moles or changes in moles, especially if there is a change in shape or color. Also, tell your health care provider if a mole is larger than the size of a pencil eraser.  A one-time screening for abdominal aortic aneurysm (AAA) and surgical repair of large AAAs by ultrasound is recommended for men aged 23-75 years who are current or former smokers.  Stay current with your vaccines (immunizations).   This information is not intended to replace advice given to you by your health care provider. Make sure you discuss any questions you have with your health care provider.   Document Released: 06/03/2008 Document Revised: 12/27/2014 Document Reviewed: 05/03/2011 Elsevier Interactive Patient Education [employer].

## 2016-04-06 NOTE — Progress Notes (Signed)
Pre visit review using our clinic review tool, if applicable. No additional management support is needed unless otherwise documented below in the visit note. 

## 2016-04-07 NOTE — Assessment & Plan Note (Signed)
Takes omeprazole as needed and watches diet.

## 2016-04-07 NOTE — Assessment & Plan Note (Signed)
BP at goal on hctz and lisinopril, checking labs and adjust as needed.

## 2016-04-07 NOTE — Assessment & Plan Note (Signed)
Checking labs and adjust as needed. BP at goal, non-smoker. Immunizations up to date and colonoscopy up to date. Given 10 year screening recommendations.

## 2016-04-08 ENCOUNTER — Ambulatory Visit: Payer: BLUE CROSS/BLUE SHIELD | Admitting: Internal Medicine

## 2016-04-09 DIAGNOSIS — N138 Other obstructive and reflux uropathy: Secondary | ICD-10-CM | POA: Diagnosis not present

## 2016-04-09 DIAGNOSIS — Z0000 Encounter for general adult medical examination without abnormal findings: Secondary | ICD-10-CM | POA: Diagnosis not present

## 2016-04-09 DIAGNOSIS — N401 Enlarged prostate with lower urinary tract symptoms: Secondary | ICD-10-CM | POA: Diagnosis not present

## 2016-04-27 ENCOUNTER — Telehealth: Payer: Self-pay | Admitting: Internal Medicine

## 2016-04-27 DIAGNOSIS — L989 Disorder of the skin and subcutaneous tissue, unspecified: Secondary | ICD-10-CM

## 2016-04-27 NOTE — Telephone Encounter (Signed)
Patient has skin cancer in his family and he has a place on the right side of his neck that has become sore and has broken open. He is new to the area and wants to know if we have any recommendations. He was last seen here on 04/06/16. Are you willing to refer this patient without seeing him first? Please advise in Dr. Nathanial Millman absence, thanks.

## 2016-04-27 NOTE — Telephone Encounter (Signed)
Pt called in and said that he has a spot on his neck and would like to be referred to dermatologist.

## 2016-04-27 NOTE — Telephone Encounter (Signed)
Left message informing patient.

## 2016-04-27 NOTE — Telephone Encounter (Signed)
Referral ordered for derm.

## 2016-05-13 DIAGNOSIS — C4441 Basal cell carcinoma of skin of scalp and neck: Secondary | ICD-10-CM | POA: Diagnosis not present

## 2016-05-13 DIAGNOSIS — D485 Neoplasm of uncertain behavior of skin: Secondary | ICD-10-CM | POA: Diagnosis not present

## 2016-05-13 DIAGNOSIS — L281 Prurigo nodularis: Secondary | ICD-10-CM | POA: Diagnosis not present

## 2016-05-27 DIAGNOSIS — C4441 Basal cell carcinoma of skin of scalp and neck: Secondary | ICD-10-CM | POA: Diagnosis not present

## 2016-06-09 ENCOUNTER — Other Ambulatory Visit: Payer: Self-pay | Admitting: Internal Medicine

## 2016-07-08 DIAGNOSIS — Z85828 Personal history of other malignant neoplasm of skin: Secondary | ICD-10-CM | POA: Diagnosis not present

## 2016-07-08 DIAGNOSIS — L570 Actinic keratosis: Secondary | ICD-10-CM | POA: Diagnosis not present

## 2016-08-13 DIAGNOSIS — H2513 Age-related nuclear cataract, bilateral: Secondary | ICD-10-CM | POA: Diagnosis not present

## 2016-08-13 DIAGNOSIS — Z0100 Encounter for examination of eyes and vision without abnormal findings: Secondary | ICD-10-CM | POA: Diagnosis not present

## 2016-08-13 DIAGNOSIS — D3132 Benign neoplasm of left choroid: Secondary | ICD-10-CM | POA: Diagnosis not present

## 2016-08-30 DIAGNOSIS — M65341 Trigger finger, right ring finger: Secondary | ICD-10-CM | POA: Diagnosis not present

## 2016-09-09 DIAGNOSIS — Z96651 Presence of right artificial knee joint: Secondary | ICD-10-CM | POA: Diagnosis not present

## 2016-09-27 DIAGNOSIS — M65341 Trigger finger, right ring finger: Secondary | ICD-10-CM | POA: Diagnosis not present

## 2016-10-10 DIAGNOSIS — Z23 Encounter for immunization: Secondary | ICD-10-CM | POA: Diagnosis not present

## 2016-10-14 ENCOUNTER — Encounter: Payer: Self-pay | Admitting: Geriatric Medicine

## 2016-10-15 DIAGNOSIS — Z85828 Personal history of other malignant neoplasm of skin: Secondary | ICD-10-CM | POA: Diagnosis not present

## 2016-10-15 DIAGNOSIS — L570 Actinic keratosis: Secondary | ICD-10-CM | POA: Diagnosis not present

## 2016-10-15 DIAGNOSIS — D225 Melanocytic nevi of trunk: Secondary | ICD-10-CM | POA: Diagnosis not present

## 2016-10-15 DIAGNOSIS — L814 Other melanin hyperpigmentation: Secondary | ICD-10-CM | POA: Diagnosis not present

## 2016-12-06 DIAGNOSIS — M25512 Pain in left shoulder: Secondary | ICD-10-CM | POA: Diagnosis not present

## 2016-12-16 DIAGNOSIS — M25512 Pain in left shoulder: Secondary | ICD-10-CM | POA: Diagnosis not present

## 2016-12-27 DIAGNOSIS — M25512 Pain in left shoulder: Secondary | ICD-10-CM | POA: Diagnosis not present

## 2016-12-27 DIAGNOSIS — S46012D Strain of muscle(s) and tendon(s) of the rotator cuff of left shoulder, subsequent encounter: Secondary | ICD-10-CM | POA: Diagnosis not present

## 2017-01-03 ENCOUNTER — Ambulatory Visit (INDEPENDENT_AMBULATORY_CARE_PROVIDER_SITE_OTHER): Payer: Medicare HMO | Admitting: Internal Medicine

## 2017-01-03 ENCOUNTER — Encounter: Payer: Self-pay | Admitting: Internal Medicine

## 2017-01-03 DIAGNOSIS — J0110 Acute frontal sinusitis, unspecified: Secondary | ICD-10-CM | POA: Diagnosis not present

## 2017-01-03 MED ORDER — AMOXICILLIN-POT CLAVULANATE 875-125 MG PO TABS: 1.0000 | ORAL_TABLET | Freq: Two times a day (BID) | ORAL | 0 refills | Status: DC

## 2017-01-03 NOTE — Patient Instructions (Signed)
We have sent in augmentin for the sinuses. Take 1 pill twice a day for 1 week.   It might be a good idea to start taking zyrtec (generic name cetirizine) as well. 1 pill per day to help decrease drainage.   IF you are feeling well, you are okay to do the surgery. If you are still feeling sick or having more problems breathing you may need to delay the surgery.

## 2017-01-03 NOTE — Progress Notes (Signed)
Pre visit review using our clinic review tool, if applicable. No additional management support is needed unless otherwise documented below in the visit note. 

## 2017-01-03 NOTE — Progress Notes (Signed)
   Subjective:    Patient ID: Christian Dodson, male    DOB: 03-05-1946, 71 y.o.   MRN: [id number]  HPI The patient is a 71 YO man coming in for sinus congestion since Thursday. Some chills no fevers. Mild earaches, headaches. Some cough but no SOB. He has surgery on his shoulder next week and is hoping to get something to head this off so he can still have the surgery. Has tried mucinex over the counter which has helped some.   Review of Systems  Constitutional: Positive for chills. Negative for activity change, appetite change, fatigue, fever and unexpected weight change.  HENT: Positive for congestion, postnasal drip, rhinorrhea and sinus pressure. Negative for dental problem, drooling, ear discharge, ear pain, sinus pain, sore throat and trouble swallowing.   Eyes: Negative.   Respiratory: Positive for cough. Negative for chest tightness, shortness of breath and wheezing.   Cardiovascular: Negative.   Gastrointestinal: Negative.   Musculoskeletal: Negative.       Objective:   Physical Exam  Constitutional: He is oriented to person, place, and time. He appears well-developed and well-nourished.  HENT:  Head: Normocephalic and atraumatic.  TMs normal bilaterally, some frontal sinus pressure, oropharynx with redness and drainage, no nasal crusting.   Eyes: EOM are normal.  Neck: Normal range of motion.  Cardiovascular: Normal rate and regular rhythm.   Pulmonary/Chest: Effort normal and breath sounds normal. No respiratory distress. He has no wheezes. He has no rales.  Abdominal: Soft.  Neurological: He is alert and oriented to person, place, and time.  Skin: Skin is warm and dry.   Vitals:   01/03/17 1615  BP: 120/84  Pulse: (!) 108  Resp: 14  Temp: 98.4 F (36.9 C)  TempSrc: Oral  SpO2: 98%  Weight: 188 lb 8 oz (85.5 kg)  Height: 5\' 6"  (1.676 m)       Assessment & Plan:

## 2017-01-04 DIAGNOSIS — J329 Chronic sinusitis, unspecified: Secondary | ICD-10-CM | POA: Insufficient documentation

## 2017-01-04 NOTE — Assessment & Plan Note (Signed)
Advised to start zyrtec daily, rx for augmentin so that he can recover prior to surgery. Lungs clear on exam today. If feeling well clear to do surgery next week. If not feeling well would recommend to move back.

## 2017-01-10 HISTORY — PX: OTHER SURGICAL HISTORY: SHX169

## 2017-01-11 DIAGNOSIS — S46812A Strain of other muscles, fascia and tendons at shoulder and upper arm level, left arm, initial encounter: Secondary | ICD-10-CM | POA: Diagnosis not present

## 2017-01-11 DIAGNOSIS — M7542 Impingement syndrome of left shoulder: Secondary | ICD-10-CM | POA: Diagnosis not present

## 2017-01-11 DIAGNOSIS — S46092A Other injury of muscle(s) and tendon(s) of the rotator cuff of left shoulder, initial encounter: Secondary | ICD-10-CM | POA: Diagnosis not present

## 2017-01-11 DIAGNOSIS — M7522 Bicipital tendinitis, left shoulder: Secondary | ICD-10-CM | POA: Diagnosis not present

## 2017-01-11 DIAGNOSIS — G8918 Other acute postprocedural pain: Secondary | ICD-10-CM | POA: Diagnosis not present

## 2017-01-11 DIAGNOSIS — S46012A Strain of muscle(s) and tendon(s) of the rotator cuff of left shoulder, initial encounter: Secondary | ICD-10-CM | POA: Diagnosis not present

## 2017-01-11 DIAGNOSIS — M19012 Primary osteoarthritis, left shoulder: Secondary | ICD-10-CM | POA: Diagnosis not present

## 2017-01-11 DIAGNOSIS — S43492A Other sprain of left shoulder joint, initial encounter: Secondary | ICD-10-CM | POA: Diagnosis not present

## 2017-02-25 DIAGNOSIS — M25512 Pain in left shoulder: Secondary | ICD-10-CM | POA: Diagnosis not present

## 2017-03-01 DIAGNOSIS — M25512 Pain in left shoulder: Secondary | ICD-10-CM | POA: Diagnosis not present

## 2017-03-03 DIAGNOSIS — M25512 Pain in left shoulder: Secondary | ICD-10-CM | POA: Diagnosis not present

## 2017-03-08 DIAGNOSIS — M25512 Pain in left shoulder: Secondary | ICD-10-CM | POA: Diagnosis not present

## 2017-03-10 DIAGNOSIS — M25512 Pain in left shoulder: Secondary | ICD-10-CM | POA: Diagnosis not present

## 2017-03-15 DIAGNOSIS — M25512 Pain in left shoulder: Secondary | ICD-10-CM | POA: Diagnosis not present

## 2017-03-17 DIAGNOSIS — M25512 Pain in left shoulder: Secondary | ICD-10-CM | POA: Diagnosis not present

## 2017-03-21 DIAGNOSIS — M25512 Pain in left shoulder: Secondary | ICD-10-CM | POA: Diagnosis not present

## 2017-03-24 DIAGNOSIS — M25512 Pain in left shoulder: Secondary | ICD-10-CM | POA: Diagnosis not present

## 2017-03-29 DIAGNOSIS — M25512 Pain in left shoulder: Secondary | ICD-10-CM | POA: Diagnosis not present

## 2017-03-31 DIAGNOSIS — M25512 Pain in left shoulder: Secondary | ICD-10-CM | POA: Diagnosis not present

## 2017-04-05 DIAGNOSIS — M25512 Pain in left shoulder: Secondary | ICD-10-CM | POA: Diagnosis not present

## 2017-04-07 DIAGNOSIS — M25512 Pain in left shoulder: Secondary | ICD-10-CM | POA: Diagnosis not present

## 2017-04-12 DIAGNOSIS — M25512 Pain in left shoulder: Secondary | ICD-10-CM | POA: Diagnosis not present

## 2017-04-13 NOTE — Progress Notes (Signed)
Pre visit review using our clinic review tool, if applicable. No additional management support is needed unless otherwise documented below in the visit note. 

## 2017-04-13 NOTE — Progress Notes (Signed)
Subjective:   Christian Dodson is a 71 y.o. male who presents for Medicare Annual/Subsequent preventive examination.  Review of Systems:  No ROS.  Medicare Wellness Visit.    Sleep patterns: no sleep issues, feels rested on waking, gets up 1 times nightly to void and sleeps 7-8 hours nightly.    Home Safety/Smoke Alarms: Feels safe in home. Smoke alarms in place.    Living environment; residence and Firearm Safety: 2-story house, no firearms. Lives with wife, no needs for DME. Seat Belt Safety/Bike Helmet: Wears seat belt.   Counseling:   Eye Exam- appointment yearly,  Dental- appointment evey 6 months Dr. Gloriann Loan  Male:   [facility]- Last 12/20/08     PSA-  Lab Results  Component Value Date   PSA 1.47 03/26/2016   PSA 1.76 03/24/2015   PSA 1.94 03/07/2014      Objective:    Vitals: There were no vitals taken for this visit.  There is no height or weight on file to calculate BMI.  Tobacco History  Smoking Status  . Never Smoker  Smokeless Tobacco  . Never Used     Counseling given: Not Answered   Past Medical History:  Diagnosis Date  . BPH (benign prostatic hyperplasia) 10/19/2013  . GERD (gastroesophageal reflux disease)   . HTN (hypertension) 10/19/2013  . Hypertension    No past surgical history on file. Family History  Problem Relation Age of Onset  . Hypertension Mother   . Cancer Father     lung  . Hyperlipidemia Maternal Grandfather   . Stroke Paternal Grandmother   . Hyperlipidemia Paternal Grandfather   . Diabetes Neg Hx    History  Sexual Activity  . Sexual activity: Yes    Outpatient Encounter Prescriptions as of 04/14/2017  Medication Sig  . amoxicillin-clavulanate (AUGMENTIN) 875-125 MG tablet Take 1 tablet by mouth 2 (two) times daily.  . calcium carbonate (OS-CAL) 600 MG TABS tablet Take 600 mg by mouth 2 (two) times daily with a meal.  . finasteride (PROSCAR) 5 MG tablet TAKE 1 TABLET DAILY (NEEDS VISIT FOR FURTHER REFILLS)  .  Glucosamine-Chondroitin 250-200 MG TABS Take by mouth 2 (two) times daily.  . hydrochlorothiazide (HYDRODIURIL) 25 MG tablet Take 1 tablet (25 mg total) by mouth daily.  Marland Kitchen lisinopril (PRINIVIL,ZESTRIL) 40 MG tablet Take 1 tablet (40 mg total) by mouth daily.  . Multiple Vitamins-Minerals (CENTRUM SILVER ULTRA MENS) TABS Take by mouth daily.  Marland Kitchen omeprazole (PRILOSEC) 20 MG capsule Take 20 mg by mouth daily.  . tamsulosin (FLOMAX) 0.4 MG CAPS capsule Take 1 capsule (0.4 mg total) by mouth daily.   No facility-administered encounter medications on file as of 04/14/2017.     Activities of Daily Living No flowsheet data found.  Patient Care Team: Hoyt Koch, MD as PCP - General (Internal Medicine)   Assessment:    Physical assessment deferred to PCP.  Exercise Activities and Dietary recommendations   Diet (meal preparation, eat out, water intake, caffeinated beverages, dairy products, fruits and vegetables): in general, a "healthy" diet  , well balanced, on average, 3 meals per day, low fat/ cholesterol, low salt water 2-3 glasses daily, eats a variety of vegetables and fruits.   Encouraged patient to increase daily water intake.    Goals    . Weight < 180 lb (81.647 kg)          Will start walking again; 5 days as tolerated;        Fall Risk  Fall Risk  03/24/2016 10/19/2013  Falls in the past year? No No   Depression Screen PHQ 2/9 Scores 03/24/2016 10/19/2013  PHQ - 2 Score 0 0    Cognitive Function       Ad8 score reviewed for issues:  Issues making decisions: no  Less interest in hobbies / activities: no  Repeats questions, stories (family complaining): no  Trouble using ordinary gadgets (microwave, computer, phone): no  Forgets the month or year: no  Mismanaging finances: no  Remembering appts: no  Daily problems with thinking and/or memory: no Ad8 score is=     Immunization History  Administered Date(s) Administered  . DT 12/21/2011  .  Influenza, High Dose Seasonal PF 09/19/2013, 10/10/2016  . Influenza,inj,Quad PF,36+ Mos 09/03/2014  . Influenza-Unspecified 08/21/2015  . Pneumococcal Conjugate-13 03/24/2016  . Pneumococcal Polysaccharide-23 03/20/2013  . Tdap 10/03/2012  . Zoster 09/03/2014   Screening Tests Health Maintenance  Topic Date Due  . Hepatitis C Screening  August 26, 1946  . INFLUENZA VACCINE  07/20/2017  . PNA vac Low Risk Adult (2 of 2 - PPSV23) 03/20/2018  . COLONOSCOPY  09/25/2020  . TETANUS/TDAP  10/03/2022      Plan:     Continue to eat heart healthy diet (full of fruits, vegetables, whole grains, lean protein, water--limit salt, fat, and sugar intake) and increase physical activity as tolerated. Increase daily water intake.  Continue doing brain stimulating activities (puzzles, reading, adult coloring books, staying active) to keep memory sharp.   During the course of the visit the patient was educated and counseled about the following appropriate screening and preventive services:   Vaccines to include Pneumoccal, Influenza, Hepatitis B, Td, Zostavax, HCV  Cardiovascular Disease  Colorectal cancer screening  Diabetes screening  Prostate Cancer Screening  Glaucoma screening  Nutrition counseling   Patient Instructions (the written plan) was given to the patient.    Michiel Cowboy, RN  04/13/2017

## 2017-04-14 ENCOUNTER — Telehealth: Payer: Self-pay | Admitting: Internal Medicine

## 2017-04-14 ENCOUNTER — Ambulatory Visit (INDEPENDENT_AMBULATORY_CARE_PROVIDER_SITE_OTHER): Payer: Medicare HMO | Admitting: Internal Medicine

## 2017-04-14 ENCOUNTER — Encounter: Payer: Self-pay | Admitting: Internal Medicine

## 2017-04-14 ENCOUNTER — Ambulatory Visit: Payer: Medicare Other

## 2017-04-14 ENCOUNTER — Other Ambulatory Visit (INDEPENDENT_AMBULATORY_CARE_PROVIDER_SITE_OTHER): Payer: Medicare HMO

## 2017-04-14 VITALS — BP 132/84 | HR 90 | Resp 20 | Ht 66.0 in | Wt 187.0 lb

## 2017-04-14 DIAGNOSIS — Z1159 Encounter for screening for other viral diseases: Secondary | ICD-10-CM

## 2017-04-14 DIAGNOSIS — Z9189 Other specified personal risk factors, not elsewhere classified: Secondary | ICD-10-CM

## 2017-04-14 DIAGNOSIS — R350 Frequency of micturition: Secondary | ICD-10-CM

## 2017-04-14 DIAGNOSIS — Z0000 Encounter for general adult medical examination without abnormal findings: Secondary | ICD-10-CM | POA: Diagnosis not present

## 2017-04-14 DIAGNOSIS — M25512 Pain in left shoulder: Secondary | ICD-10-CM | POA: Diagnosis not present

## 2017-04-14 DIAGNOSIS — I10 Essential (primary) hypertension: Secondary | ICD-10-CM

## 2017-04-14 DIAGNOSIS — R7989 Other specified abnormal findings of blood chemistry: Secondary | ICD-10-CM | POA: Diagnosis not present

## 2017-04-14 DIAGNOSIS — N401 Enlarged prostate with lower urinary tract symptoms: Secondary | ICD-10-CM | POA: Diagnosis not present

## 2017-04-14 LAB — CBC
HCT: 44.8 % (ref 39.0–52.0)
Hemoglobin: 15.8 g/dL (ref 13.0–17.0)
MCHC: 35.4 g/dL (ref 30.0–36.0)
MCV: 90.5 fl (ref 78.0–100.0)
Platelets: 255 10*3/uL (ref 150.0–400.0)
RBC: 4.95 Mil/uL (ref 4.22–5.81)
RDW: 12.7 % (ref 11.5–15.5)
WBC: 7.6 10*3/uL (ref 4.0–10.5)

## 2017-04-14 LAB — COMPREHENSIVE METABOLIC PANEL
ALT: 29 U/L (ref 0–53)
AST: 17 U/L (ref 0–37)
Albumin: 4.8 g/dL (ref 3.5–5.2)
Alkaline Phosphatase: 74 U/L (ref 39–117)
BILIRUBIN TOTAL: 0.4 mg/dL (ref 0.2–1.2)
BUN: 21 mg/dL (ref 6–23)
CALCIUM: 10.4 mg/dL (ref 8.4–10.5)
CO2: 28 meq/L (ref 19–32)
Chloride: 104 mEq/L (ref 96–112)
Creatinine, Ser: 1.13 mg/dL (ref 0.40–1.50)
GFR: 68.44 mL/min (ref >60.00)
Glucose, Bld: 122 mg/dL — ABNORMAL HIGH (ref 70–99)
Potassium: 4.1 mEq/L (ref 3.5–5.1)
Sodium: 140 mEq/L (ref 135–145)
TOTAL PROTEIN: 7.3 g/dL (ref 6.0–8.3)

## 2017-04-14 LAB — LIPID PANEL
Cholesterol: 170 mg/dL (ref 0–200)
HDL: 35.3 mg/dL — AB (ref >39.00)
Total CHOL/HDL Ratio: 5
Triglycerides: 401 mg/dL — ABNORMAL HIGH (ref 0.0–149.0)

## 2017-04-14 LAB — HEMOGLOBIN A1C: HEMOGLOBIN A1C: 5.7 % (ref 4.6–6.5)

## 2017-04-14 LAB — PSA: PSA: 1.66 ng/mL (ref 0.10–4.00)

## 2017-04-14 LAB — LDL CHOLESTEROL, DIRECT: LDL DIRECT: 85 mg/dL

## 2017-04-14 LAB — HEPATITIS C ANTIBODY: HCV AB: NEGATIVE

## 2017-04-14 MED ORDER — FINASTERIDE 5 MG PO TABS: ORAL_TABLET | ORAL | 3 refills | Status: DC

## 2017-04-14 MED ORDER — LISINOPRIL 40 MG PO TABS: 40.0000 mg | ORAL_TABLET | Freq: Every day | ORAL | 3 refills | Status: DC

## 2017-04-14 MED ORDER — HYDROCHLOROTHIAZIDE 25 MG PO TABS: 25.0000 mg | ORAL_TABLET | Freq: Every day | ORAL | 3 refills | Status: DC

## 2017-04-14 NOTE — Telephone Encounter (Signed)
Pt needs all meds sent to today resent to [facility]

## 2017-04-14 NOTE — Patient Instructions (Addendum)
Continue to eat heart healthy diet (full of fruits, vegetables, whole grains, lean protein, water--limit salt, fat, and sugar intake) and increase physical activity as tolerated. Increase daily water intake.  Continue doing brain stimulating activities (puzzles, reading, adult coloring books, staying active) to keep memory sharp.    Christian Dodson , Thank you for taking time to come for your Medicare Wellness Visit. I appreciate your ongoing commitment to your health goals. Please review the following plan we discussed and let me know if I can assist you in the future.   These are the goals we discussed: Goals    . Be as healthy and active as long as possible          Continue to exercise and eat healthy    . Weight < 180 lb (81.647 kg)          Will start walking again; 5 days as tolerated;         This is a list of the screening recommended for you and due dates:  Health Maintenance  Topic Date Due  .  Hepatitis C: One time screening is recommended by Center for Disease Control  (CDC) for  adults born from [birth year] through 1963.   Dec 10, 1946  . Flu Shot  07/20/2017  . Pneumonia vaccines (2 of 2 - PPSV23) 03/20/2018  . Colon Cancer Screening  09/25/2020  . Tetanus Vaccine  10/03/2022

## 2017-04-14 NOTE — Assessment & Plan Note (Signed)
Hep c screening done today, colonoscopy up to date. Pneumonia and flu and tetanus shots up to date. Counseled on new shingles vaccine. Counseled on sun safety and mole surveillance. Given 10 year screening recommendations.

## 2017-04-14 NOTE — Telephone Encounter (Signed)
Sent!

## 2017-04-14 NOTE — Progress Notes (Signed)
   Subjective:    Patient ID: Christian Dodson, male    DOB: [birth year], 71 y.o.   MRN: [id number]  HPI The patient is a 71 YO man coming in for wellness.  PMH, [facility], social history reviewed and updated.   Review of Systems  Constitutional: Negative.   HENT: Negative.   Eyes: Negative.   Respiratory: Negative for cough, chest tightness and shortness of breath.   Cardiovascular: Negative for chest pain, palpitations and leg swelling.  Gastrointestinal: Negative for abdominal distention, abdominal pain, constipation, diarrhea, nausea and vomiting.  Musculoskeletal: Negative.   Skin: Negative.   Neurological: Negative.   Psychiatric/Behavioral: Negative.       Objective:   Physical Exam  Constitutional: He is oriented to person, place, and time. He appears well-developed and well-nourished.  HENT:  Head: Normocephalic and atraumatic.  Eyes: EOM are normal.  Neck: Normal range of motion.  Cardiovascular: Normal rate and regular rhythm.   Pulmonary/Chest: Effort normal and breath sounds normal. No respiratory distress. He has no wheezes. He has no rales.  Abdominal: Soft. Bowel sounds are normal. He exhibits no distension. There is no tenderness. There is no rebound.  Musculoskeletal: He exhibits no edema.  Neurological: He is alert and oriented to person, place, and time. Coordination normal.  Skin: Skin is warm and dry.  Psychiatric: He has a normal mood and affect.   Vitals:   04/14/17 0812  BP: 132/84  Pulse: 90  Resp: 20  SpO2: 98%  Weight: 187 lb (84.8 kg)  Height: 5\' 6"  (1.676 m)      Assessment & Plan:

## 2017-04-14 NOTE — Progress Notes (Signed)
Patient ID: Christian Dodson, male   DOB: January 10, 1946, 71 y.o.   MRN: [id number] Medical screening examination/treatment/procedure(s) were performed by non-physician practitioner and as supervising physician I was immediately available for consultation/collaboration. I agree with above. Hoyt Koch, MD

## 2017-04-14 NOTE — Assessment & Plan Note (Addendum)
BP at goal on lisinopril 40 mg and hctz 25 mg daily. Checking CMP and adjust as needed.

## 2017-04-14 NOTE — Assessment & Plan Note (Signed)
Refill proscar which is controlling the symptoms adequately.

## 2017-04-14 NOTE — Progress Notes (Signed)
Pre visit review using our clinic review tool, if applicable. No additional management support is needed unless otherwise documented below in the visit note. 

## 2017-04-18 DIAGNOSIS — Z125 Encounter for screening for malignant neoplasm of prostate: Secondary | ICD-10-CM | POA: Diagnosis not present

## 2017-04-18 DIAGNOSIS — R351 Nocturia: Secondary | ICD-10-CM | POA: Diagnosis not present

## 2017-08-15 DIAGNOSIS — H2513 Age-related nuclear cataract, bilateral: Secondary | ICD-10-CM | POA: Diagnosis not present

## 2017-08-15 DIAGNOSIS — H52203 Unspecified astigmatism, bilateral: Secondary | ICD-10-CM | POA: Diagnosis not present

## 2017-10-13 DIAGNOSIS — Z23 Encounter for immunization: Secondary | ICD-10-CM | POA: Diagnosis not present

## 2017-12-08 ENCOUNTER — Ambulatory Visit: Payer: Medicare HMO | Admitting: Internal Medicine

## 2017-12-08 ENCOUNTER — Encounter: Payer: Self-pay | Admitting: Internal Medicine

## 2017-12-08 DIAGNOSIS — J0111 Acute recurrent frontal sinusitis: Secondary | ICD-10-CM

## 2017-12-08 MED ORDER — AMOXICILLIN-POT CLAVULANATE 875-125 MG PO TABS: 1.0000 | ORAL_TABLET | Freq: Two times a day (BID) | ORAL | 0 refills | Status: DC

## 2017-12-08 NOTE — Progress Notes (Signed)
   Subjective:    Patient ID: Christian Dodson, male    DOB: 21-Oct-1946, 71 y.o.   MRN: [id number]  HPI The patient is a 71 YO man coming in for sinus problems which are going on for about 3-4 weeks. He has tried otc medications including sudafed and robitussin. These helped temporarily but then symptoms returned. Some fevers and chills throughout the worse. Overall stable to mildly worse. Cough as well non-productive. Denies SOB or nausea or vomiting.   Review of Systems  Constitutional: Positive for activity change, appetite change, chills and fever. Negative for fatigue and unexpected weight change.  HENT: Positive for congestion, postnasal drip, rhinorrhea, sinus pressure and sore throat. Negative for ear discharge, ear pain, sinus pain, sneezing, tinnitus, trouble swallowing and voice change.   Eyes: Negative.   Respiratory: Positive for cough. Negative for chest tightness, shortness of breath and wheezing.   Cardiovascular: Negative.   Gastrointestinal: Negative.   Musculoskeletal: Positive for myalgias.  Neurological: Negative.       Objective:   Physical Exam  Constitutional: He is oriented to person, place, and time. He appears well-developed and well-nourished.  HENT:  Head: Normocephalic and atraumatic.  Oropharynx with redness and clear drainage, nose with swollen turbinates, TMs normal bilaterally  Eyes: EOM are normal.  Neck: Normal range of motion. No thyromegaly present.  Cardiovascular: Normal rate and regular rhythm.  Pulmonary/Chest: Effort normal and breath sounds normal. No respiratory distress. He has no wheezes. He has no rales.  Abdominal: Soft.  Lymphadenopathy:    He has cervical adenopathy.  Neurological: He is alert and oriented to person, place, and time.  Skin: Skin is warm and dry.   Vitals:   12/08/17 1104  BP: 124/88  Pulse: 87  Temp: 98.5 F (36.9 C)  TempSrc: Oral  SpO2: 98%  Weight: 187 lb (84.8 kg)  Height: 5\' 6"  (1.676 m)      Assessment  & Plan:

## 2017-12-08 NOTE — Assessment & Plan Note (Signed)
Rx for augmentin 10 day course.  

## 2017-12-08 NOTE — Patient Instructions (Addendum)
We have sent in the augmentin for the sinuses. Take 1 pill twice a day for 10 days.   Start taking zyrtec (cetirizine) or flonase for the allergies to help stop the drainage.

## 2018-04-03 ENCOUNTER — Other Ambulatory Visit: Payer: Self-pay | Admitting: Internal Medicine

## 2018-04-10 ENCOUNTER — Other Ambulatory Visit: Payer: Self-pay | Admitting: Internal Medicine

## 2018-04-14 NOTE — Progress Notes (Signed)
Subjective:   Christian Dodson is a 72 y.o. male who presents for Medicare Annual/Subsequent preventive examination.  Review of Systems:  No ROS.  Medicare Wellness Visit. Additional risk factors are reflected in the social history.  Cardiac Risk Factors include: advanced age (>48men, >59 women);hypertension;male gender Sleep patterns: feels rested on waking, gets up 1-2 times nightly to void and sleeps 7-8 hours nightly.    Home Safety/Smoke Alarms: Feels safe in home. Smoke alarms in place.  Living environment; residence and Firearm Safety: 1-story house/ trailer, no firearms. Lives with wife, no needs for DME, good support system Seat Belt Safety/Bike Helmet: Wears seat belt.   PSA-  Lab Results  Component Value Date   PSA 1.66 04/14/2017   PSA 1.47 03/26/2016   PSA 1.76 03/24/2015       Objective:    Vitals: BP 122/78   Pulse 92   Temp 97.7 F (36.5 C)   Resp 18   Ht 5\' 6"  (1.676 m)   Wt 181 lb (82.1 kg)   SpO2 98%   BMI 29.21 kg/m   Body mass index is 29.21 kg/m.  Advanced Directives 04/17/2018 04/14/2017 03/24/2016  Does Patient Have a Medical Advance Directive? Yes Yes Yes  Type of Paramedic of [place];Living will [place];Living will -  Copy of [place] in Chart? No - copy requested Yes -    Tobacco Social History   Tobacco Use  Smoking Status Never Smoker  Smokeless Tobacco Never Used     Counseling given: Not Answered   Past Medical History:  Diagnosis Date  . BPH (benign prostatic hyperplasia) 10/19/2013  . GERD (gastroesophageal reflux disease)   . HTN (hypertension) 10/19/2013  . Hypertension    Past Surgical History:  Procedure Laterality Date  . JOINT REPLACEMENT    . left shoulder rotator and bicep  01/10/2017  . MEDIAL PARTIAL KNEE REPLACEMENT Right 2013   Family History  Problem Relation Age of Onset  . Hypertension Mother   . Cancer Father        lung  .  Hyperlipidemia Maternal Grandfather   . Heart disease Maternal Grandfather   . Stroke Paternal Grandmother   . Heart disease Paternal Grandmother   . Hyperlipidemia Paternal Grandfather   . Diabetes Neg Hx    Social History   Socioeconomic History  . Marital status: Married    Spouse name: Not on file  . Number of children: 2  . Years of education: Not on file  . Highest education level: Not on file  Occupational History  . Not on file  Social Needs  . Financial resource strain: Not hard at all  . Food insecurity:    Worry: Never true    Inability: Never true  . Transportation needs:    Medical: No    Non-medical: No  Tobacco Use  . Smoking status: Never Smoker  . Smokeless tobacco: Never Used  Substance and Sexual Activity  . Alcohol use: Yes    Alcohol/week: 0.6 oz    Types: 1 Cans of beer per week  . Drug use: No  . Sexual activity: Yes  Lifestyle  . Physical activity:    Days per week: 3 days    Minutes per session: 60 min  . Stress: Not at all  Relationships  . Social connections:    Talks on phone: More than three times a week    Gets together: More than three times a week  Attends religious service: More than 4 times per year    Active member of club or organization: Yes    Attends meetings of clubs or organizations: Not on file    Relationship status: Married  Other Topics Concern  . Not on file  Social History Narrative  . Not on file    Outpatient Encounter Medications as of 04/17/2018  Medication Sig  . acetaminophen (TYLENOL) 500 MG tablet Take 500 mg by mouth every 6 (six) hours as needed.  . calcium carbonate (OS-CAL) 600 MG TABS tablet Take 600 mg by mouth 2 (two) times daily with a meal.  . finasteride (PROSCAR) 5 MG tablet TAKE 1 TABLET DAILY  . Glucosamine-Chondroitin 250-200 MG TABS Take by mouth 2 (two) times daily.  . hydrochlorothiazide (HYDRODIURIL) 25 MG tablet TAKE 1 TABLET DAILY  . lisinopril (PRINIVIL,ZESTRIL) 40 MG tablet TAKE 1  TABLET DAILY  . Multiple Vitamins-Minerals (CENTRUM SILVER ULTRA MENS) TABS Take by mouth daily.  Marland Kitchen omeprazole (PRILOSEC) 20 MG capsule Take 20 mg by mouth daily.  . [DISCONTINUED] amoxicillin-clavulanate (AUGMENTIN) 875-125 MG tablet Take 1 tablet by mouth 2 (two) times daily. (Patient not taking: Reported on 04/17/2018)   No facility-administered encounter medications on file as of 04/17/2018.     Activities of Daily Living In your present state of health, do you have any difficulty performing the following activities: 04/17/2018  Hearing? N  Vision? N  Difficulty concentrating or making decisions? N  Walking or climbing stairs? N  Dressing or bathing? N  Doing errands, shopping? N  Preparing Food and eating ? N  Using the Toilet? N  In the past six months, have you accidently leaked urine? N  Do you have problems with loss of bowel control? N  Managing your Medications? N  Managing your Finances? N  Housekeeping or managing your Housekeeping? N  Some recent data might be hidden    Patient Care Team: Hoyt Koch, MD as PCP - General (Internal Medicine) Ardis Hughs, MD as Attending Physician (Urology)   Assessment:   This is a routine wellness examination for [name]. Physical assessment deferred to PCP.   Exercise Activities and Dietary recommendations Current Exercise Habits: Structured exercise class, Time (Minutes): 60, Frequency (Times/Week): 3, Weekly Exercise (Minutes/Week): 180, Exercise limited by: orthopedic condition(s)  Diet (meal preparation, eat out, water intake, caffeinated beverages, dairy products, fruits and vegetables): in general, a "healthy" diet  , well balanced, eats a variety of fruits and vegetables daily, limits salt, fat/cholesterol, sugar,carbohydrates,caffeine, drinks 6-8 glasses of water daily.  Reviewed heart healthy diet     Goals    . Patient Stated     I want to lose weight by cutting back on the amount of sugar and snacks  I eat. Continue to exercise. Enjoy life, family and play golf.    . Weight < 180 lb (81.647 kg)     Will start walking again; 5 days as tolerated;         Fall Risk Fall Risk  04/17/2018 04/14/2017 03/24/2016 10/19/2013  Falls in the past year? No Yes No No    Depression Screen PHQ 2/9 Scores 04/17/2018 04/14/2017 04/14/2017 03/24/2016  PHQ - 2 Score 0 0 0 0  PHQ- 9 Score 0 0 - -    Cognitive Function MMSE - Mini Mental State Exam 04/17/2018 04/17/2018  Not completed: (No Data) Refused       Ad8 score reviewed for issues:  Issues making decisions: no  Less interest  in hobbies / activities: no  Repeats questions, stories (family complaining): no  Trouble using ordinary gadgets (microwave, computer, phone):no  Forgets the month or year: no  Mismanaging finances: no  Remembering appts: no  Daily problems with thinking and/or memory: no Ad8 score is= 0  Immunization History  Administered Date(s) Administered  . DT 12/21/2011  . Influenza, High Dose Seasonal PF 09/19/2013, 10/10/2016  . Influenza,inj,Quad PF,6+ Mos 09/03/2014  . Influenza-Unspecified 08/21/2015, 10/03/2017  . Pneumococcal Conjugate-13 03/24/2016  . Pneumococcal Polysaccharide-23 03/20/2013  . Tdap 10/03/2012  . Zoster 09/03/2014   Screening Tests Health Maintenance  Topic Date Due  . PNA vac Low Risk Adult (2 of 2 - PPSV23) 04/18/2019 (Originally 03/20/2018)  . INFLUENZA VACCINE  07/20/2018  . COLONOSCOPY  09/25/2020  . TETANUS/TDAP  10/03/2022  . Hepatitis C Screening  Completed      Plan:   Continue doing brain stimulating activities (puzzles, reading, adult coloring books, staying active) to keep memory sharp.   Continue to eat heart healthy diet (full of fruits, vegetables, whole grains, lean protein, water--limit salt, fat, and sugar intake) and increase physical activity as tolerated.   I have personally reviewed and noted the following in the patient's chart:   . Medical and social  history . Use of alcohol, tobacco or illicit drugs  . Current medications and supplements . Functional ability and status . Nutritional status . Physical activity . Advanced directives . List of other physicians . Vitals . Screenings to include cognitive, depression, and falls . Referrals and appointments  In addition, I have reviewed and discussed with patient certain preventive protocols, quality metrics, and best practice recommendations. A written personalized care plan for preventive services as well as general preventive health recommendations were provided to patient.     Michiel Cowboy, RN  04/17/2018

## 2018-04-17 ENCOUNTER — Encounter: Payer: Self-pay | Admitting: Internal Medicine

## 2018-04-17 ENCOUNTER — Encounter: Payer: Medicare HMO | Admitting: Internal Medicine

## 2018-04-17 ENCOUNTER — Ambulatory Visit (INDEPENDENT_AMBULATORY_CARE_PROVIDER_SITE_OTHER): Payer: Medicare HMO | Admitting: Internal Medicine

## 2018-04-17 ENCOUNTER — Ambulatory Visit (INDEPENDENT_AMBULATORY_CARE_PROVIDER_SITE_OTHER): Payer: Medicare HMO | Admitting: *Deleted

## 2018-04-17 ENCOUNTER — Other Ambulatory Visit (INDEPENDENT_AMBULATORY_CARE_PROVIDER_SITE_OTHER): Payer: Medicare HMO

## 2018-04-17 VITALS — BP 122/78 | HR 92 | Temp 97.7°F | Resp 18 | Ht 66.0 in | Wt 181.0 lb

## 2018-04-17 VITALS — BP 122/78 | HR 92 | Temp 97.7°F | Ht 66.0 in | Wt 181.0 lb

## 2018-04-17 DIAGNOSIS — R350 Frequency of micturition: Secondary | ICD-10-CM | POA: Diagnosis not present

## 2018-04-17 DIAGNOSIS — N401 Enlarged prostate with lower urinary tract symptoms: Secondary | ICD-10-CM | POA: Diagnosis not present

## 2018-04-17 DIAGNOSIS — Z0000 Encounter for general adult medical examination without abnormal findings: Secondary | ICD-10-CM

## 2018-04-17 DIAGNOSIS — K219 Gastro-esophageal reflux disease without esophagitis: Secondary | ICD-10-CM | POA: Diagnosis not present

## 2018-04-17 DIAGNOSIS — I10 Essential (primary) hypertension: Secondary | ICD-10-CM | POA: Diagnosis not present

## 2018-04-17 LAB — COMPREHENSIVE METABOLIC PANEL
ALBUMIN: 4.7 g/dL (ref 3.5–5.2)
ALK PHOS: 64 U/L (ref 39–117)
ALT: 34 U/L (ref 0–53)
AST: 19 U/L (ref 0–37)
BUN: 24 mg/dL — ABNORMAL HIGH (ref 6–23)
CO2: 28 mEq/L (ref 19–32)
CREATININE: 1.19 mg/dL (ref 0.40–1.50)
Calcium: 10 mg/dL (ref 8.4–10.5)
Chloride: 102 mEq/L (ref 96–112)
GFR: 64.28 mL/min (ref >60.00)
GLUCOSE: 110 mg/dL — AB (ref 70–99)
Potassium: 3.7 mEq/L (ref 3.5–5.1)
Sodium: 138 mEq/L (ref 135–145)
TOTAL PROTEIN: 7.4 g/dL (ref 6.0–8.3)
Total Bilirubin: 0.7 mg/dL (ref 0.2–1.2)

## 2018-04-17 LAB — CBC
HCT: 45.4 % (ref 39.0–52.0)
HEMOGLOBIN: 16 g/dL (ref 13.0–17.0)
MCHC: 35.3 g/dL (ref 30.0–36.0)
MCV: 91.2 fl (ref 78.0–100.0)
PLATELETS: 239 10*3/uL (ref 150.0–400.0)
RBC: 4.98 Mil/uL (ref 4.22–5.81)
RDW: 13.2 % (ref 11.5–15.5)
WBC: 7.7 10*3/uL (ref 4.0–10.5)

## 2018-04-17 LAB — PSA: PSA: 2.28 ng/mL (ref 0.10–4.00)

## 2018-04-17 LAB — LIPID PANEL
CHOLESTEROL: 170 mg/dL (ref 0–200)
HDL: 32.4 mg/dL — ABNORMAL LOW (ref >39.00)
NonHDL: 137.21
Total CHOL/HDL Ratio: 5
Triglycerides: 227 mg/dL — ABNORMAL HIGH (ref 0.0–149.0)
VLDL: 45.4 mg/dL — ABNORMAL HIGH (ref 0.0–40.0)

## 2018-04-17 LAB — LDL CHOLESTEROL, DIRECT: LDL DIRECT: 109 mg/dL

## 2018-04-17 NOTE — Assessment & Plan Note (Signed)
Taking proscar and 1-2 times urinating at night time. Checking PSA today.

## 2018-04-17 NOTE — Progress Notes (Signed)
Medical screening examination/treatment/procedure(s) were performed by non-physician practitioner and as supervising physician I was immediately available for consultation/collaboration. I agree with above. Elizabeth A Crawford, MD 

## 2018-04-17 NOTE — Assessment & Plan Note (Signed)
Colonoscopy up to date. Tetanus and flu and pneumonia up to date. Counseled about shingrix. Counseled about sun safety and mole surveillance. Given 10 year screening recommendations.

## 2018-04-17 NOTE — Assessment & Plan Note (Signed)
BP at goal on lisinopril 40 mg daily hctz 25 mg daily. Checking CMP and adjust as needed.

## 2018-04-17 NOTE — Assessment & Plan Note (Signed)
Using omeprazole 20 mg daily with good control.

## 2018-04-17 NOTE — Patient Instructions (Signed)

## 2018-04-17 NOTE — Patient Instructions (Addendum)
Continue doing brain stimulating activities (puzzles, reading, adult coloring books, staying active) to keep memory sharp.   Continue to eat heart healthy diet (full of fruits, vegetables, whole grains, lean protein, water--limit salt, fat, and sugar intake) and increase physical activity as tolerated.   Christian Dodson , Thank you for taking time to come for your Medicare Wellness Visit. I appreciate your ongoing commitment to your health goals. Please review the following plan we discussed and let me know if I can assist you in the future.   These are the goals we discussed: Goals    . Patient Stated     I want to lose weight by cutting back on the amount of sugar and snacks I eat. Continue to exercise. Enjoy life, family and play golf.    . Weight < 180 lb (81.647 kg)     Will start walking again; 5 days as tolerated;         This is a list of the screening recommended for you and due dates:  Health Maintenance  Topic Date Due  . Pneumonia vaccines (2 of 2 - PPSV23) 04/18/2019*  . Flu Shot  07/20/2018  . Colon Cancer Screening  09/25/2020  . Tetanus Vaccine  10/03/2022  .  Hepatitis C: One time screening is recommended by Center for Disease Control  (CDC) for  adults born from [birth year] through 1963.   Completed  *Topic was postponed. The date shown is not the original due date.

## 2018-04-17 NOTE — Progress Notes (Signed)
   Subjective:    Patient ID: Christian Dodson, male    DOB: Jul 01, 1946, 72 y.o.   MRN: [id number]  HPI The patient is a 72 YO man coming in for physical.  PMH, [place], social history reviewed and updated  Review of Systems  Constitutional: Negative.   HENT: Negative.   Eyes: Negative.   Respiratory: Negative for cough, chest tightness and shortness of breath.   Cardiovascular: Negative for chest pain, palpitations and leg swelling.  Gastrointestinal: Negative for abdominal distention, abdominal pain, constipation, diarrhea, nausea and vomiting.  Musculoskeletal: Negative.   Skin: Negative.   Neurological: Negative.   Psychiatric/Behavioral: Negative.       Objective:   Physical Exam  Constitutional: He is oriented to person, place, and time. He appears well-developed and well-nourished.  HENT:  Head: Normocephalic and atraumatic.  Eyes: EOM are normal.  Neck: Normal range of motion.  Cardiovascular: Normal rate and regular rhythm.  Pulmonary/Chest: Effort normal and breath sounds normal. No respiratory distress. He has no wheezes. He has no rales.  Abdominal: Soft. Bowel sounds are normal. He exhibits no distension. There is no tenderness. There is no rebound.  Musculoskeletal: He exhibits no edema.  Neurological: He is alert and oriented to person, place, and time. Coordination normal.  Skin: Skin is warm and dry.  Psychiatric: He has a normal mood and affect.   Vitals:   04/17/18 0852  BP: 122/78  Pulse: 92  Temp: 97.7 F (36.5 C)  TempSrc: Oral  SpO2: 98%  Weight: 181 lb (82.1 kg)  Height: 5\' 6"  (1.676 m)      Assessment & Plan:

## 2018-04-27 DIAGNOSIS — N5201 Erectile dysfunction due to arterial insufficiency: Secondary | ICD-10-CM | POA: Diagnosis not present

## 2018-04-27 DIAGNOSIS — R3915 Urgency of urination: Secondary | ICD-10-CM | POA: Diagnosis not present

## 2018-04-27 DIAGNOSIS — N401 Enlarged prostate with lower urinary tract symptoms: Secondary | ICD-10-CM | POA: Diagnosis not present

## 2018-04-27 DIAGNOSIS — Z125 Encounter for screening for malignant neoplasm of prostate: Secondary | ICD-10-CM | POA: Diagnosis not present

## 2018-05-08 ENCOUNTER — Other Ambulatory Visit: Payer: Self-pay | Admitting: Internal Medicine

## 2018-07-09 ENCOUNTER — Other Ambulatory Visit: Payer: Self-pay | Admitting: Internal Medicine

## 2018-08-17 DIAGNOSIS — H2513 Age-related nuclear cataract, bilateral: Secondary | ICD-10-CM | POA: Diagnosis not present

## 2018-08-17 DIAGNOSIS — H5203 Hypermetropia, bilateral: Secondary | ICD-10-CM | POA: Diagnosis not present

## 2018-12-28 ENCOUNTER — Telehealth: Payer: Self-pay | Admitting: Internal Medicine

## 2018-12-28 NOTE — Telephone Encounter (Signed)
Copied from [place] [phone]. Topic: Quick Communication - See Telephone Encounter >> Dec 28, 2018  1:47 PM Blase Mess A wrote: CRM for notification. See Telephone encounter for: 12/28/18.  Patient is calling to book AWV with Sharee Pimple & Dr. Sharlet Salina in some from 04/19/19 please advise [phone]  [phone]

## 2019-03-09 DIAGNOSIS — D225 Melanocytic nevi of trunk: Secondary | ICD-10-CM | POA: Diagnosis not present

## 2019-03-09 DIAGNOSIS — L821 Other seborrheic keratosis: Secondary | ICD-10-CM | POA: Diagnosis not present

## 2019-03-09 DIAGNOSIS — L814 Other melanin hyperpigmentation: Secondary | ICD-10-CM | POA: Diagnosis not present

## 2019-03-09 DIAGNOSIS — B078 Other viral warts: Secondary | ICD-10-CM | POA: Diagnosis not present

## 2019-03-09 DIAGNOSIS — L820 Inflamed seborrheic keratosis: Secondary | ICD-10-CM | POA: Diagnosis not present

## 2019-03-29 ENCOUNTER — Other Ambulatory Visit: Payer: Self-pay | Admitting: Internal Medicine

## 2019-04-05 ENCOUNTER — Other Ambulatory Visit: Payer: Self-pay | Admitting: Internal Medicine

## 2019-04-24 ENCOUNTER — Encounter: Payer: Self-pay | Admitting: Internal Medicine

## 2019-04-24 ENCOUNTER — Ambulatory Visit (INDEPENDENT_AMBULATORY_CARE_PROVIDER_SITE_OTHER): Payer: Medicare HMO | Admitting: *Deleted

## 2019-04-24 ENCOUNTER — Ambulatory Visit (INDEPENDENT_AMBULATORY_CARE_PROVIDER_SITE_OTHER): Payer: Medicare HMO | Admitting: Internal Medicine

## 2019-04-24 DIAGNOSIS — R350 Frequency of micturition: Secondary | ICD-10-CM | POA: Diagnosis not present

## 2019-04-24 DIAGNOSIS — K219 Gastro-esophageal reflux disease without esophagitis: Secondary | ICD-10-CM | POA: Diagnosis not present

## 2019-04-24 DIAGNOSIS — I10 Essential (primary) hypertension: Secondary | ICD-10-CM | POA: Diagnosis not present

## 2019-04-24 DIAGNOSIS — N401 Enlarged prostate with lower urinary tract symptoms: Secondary | ICD-10-CM

## 2019-04-24 DIAGNOSIS — Z0000 Encounter for general adult medical examination without abnormal findings: Secondary | ICD-10-CM | POA: Diagnosis not present

## 2019-04-24 MED ORDER — FINASTERIDE 5 MG PO TABS: 5.0000 mg | ORAL_TABLET | Freq: Every day | ORAL | 3 refills | Status: DC

## 2019-04-24 MED ORDER — LISINOPRIL 40 MG PO TABS: 40.0000 mg | ORAL_TABLET | Freq: Every day | ORAL | 3 refills | Status: DC

## 2019-04-24 MED ORDER — HYDROCHLOROTHIAZIDE 25 MG PO TABS: 25.0000 mg | ORAL_TABLET | Freq: Every day | ORAL | 3 refills | Status: DC

## 2019-04-24 NOTE — Progress Notes (Signed)
Virtual Visit via Video Note  I connected with Christian Dodson on 04/24/19 at  2:20 PM EDT by a video enabled telemedicine application and verified that I am speaking with the correct person using two identifiers.  The patient and the provider were at separate locations throughout the entire encounter.   I discussed the limitations of evaluation and management by telemedicine and the availability of in person appointments. The patient expressed understanding and agreed to proceed.  History of Present Illness: The patient is a 73 y.o. man with visit for follow up of his BPH (seeing urology yearly, denies worsening symptoms of night time urination, still getting up at least once a night, taking proscar, denies side effects, denies new hesitancy or urgency with urination) and blood pressure (taking hctz and lisinopril, denies headaches or chest pains, denies fevers or chills or cough, denies side effects, takes BP rarely at home and consistent with prior around 120-130s/70-80s) and GERD (taking prilosec 20 mg daily, denies breakthrough symptoms, denies side effects from that, does have some symptoms with missed or late dose). He is social distancing.   Observations/Objective: Appearance: normal, breathing appears normal, casual grooming, abdomen does not appear distended, throat normal, EOM intact, mental status is A and O times 3  128/82 BP today, HR 83, 186 lb  Assessment and Plan: See problem oriented charting  Follow Up Instructions: come back in around 6 months for physical exam and labs ordered to be done within in the next month  I discussed the assessment and treatment plan with the patient. The patient was provided an opportunity to ask questions and all were answered. The patient agreed with the plan and demonstrated an understanding of the instructions.   The patient was advised to call back or seek an in-person evaluation if the symptoms worsen or if the condition fails to improve as  anticipated.  Hoyt Koch, MD

## 2019-04-24 NOTE — Progress Notes (Signed)
Medical screening examination/treatment/procedure(s) were performed by non-physician practitioner and as supervising physician I was immediately available for consultation/collaboration. I agree with above. Elizabeth A Crawford, MD 

## 2019-04-24 NOTE — Progress Notes (Signed)
Subjective:   Christian Dodson is a 73 y.o. male who presents for Medicare Annual/Subsequent preventive examination.  I connected with patient 04/24/19 at  1:00 PM EDT by a video/audio enabled telemedicine application and verified that I am speaking with the correct person using two identifiers. Patient stated full name and DOB. Patient gave permission to continue with virtual visit. Patient's location was at home and Nurse's location was at [place] office.   Review of Systems:  No ROS.  Medicare Wellness Virtual Visit.  Visual/audio telehealth visit, UTA vital signs.   See social history for additional risk factors.  Cardiac Risk Factors include: advanced age (>34men, >75 women);male gender;hypertension Sleep patterns: feels rested on waking, gets up 1-2 times nightly to void and sleeps 7-8 hours nightly.   Home Safety/Smoke Alarms: Feels safe in home. Smoke alarms in place.  Living environment; residence and Firearm Safety: 1-story house/ trailer. Lives wife, no needs for DME, good support system Seat Belt Safety/Bike Helmet: Wears seat belt.   PSA-  Lab Results  Component Value Date   PSA 2.28 04/17/2018   PSA 1.66 04/14/2017   PSA 1.47 03/26/2016      Objective:    Vitals: There were no vitals taken for this visit.  There is no height or weight on file to calculate BMI.  Advanced Directives 04/24/2019 04/17/2018 04/14/2017 03/24/2016  Does Patient Have a Medical Advance Directive? Yes Yes Yes Yes  Type of Paramedic of [place];Living will [place];Living will [place];Living will -  Copy of [place] in Chart? No - copy requested No - copy requested Yes -    Tobacco Social History   Tobacco Use  Smoking Status Never Smoker  Smokeless Tobacco Never Used     Counseling given: Not Answered  Past Medical History:  Diagnosis Date  . BPH (benign prostatic hyperplasia) 10/19/2013  . GERD  (gastroesophageal reflux disease)   . HTN (hypertension) 10/19/2013  . Hypertension    Past Surgical History:  Procedure Laterality Date  . JOINT REPLACEMENT    . left shoulder rotator and bicep  01/10/2017  . MEDIAL PARTIAL KNEE REPLACEMENT Right 2013   Family History  Problem Relation Age of Onset  . Hypertension Mother   . Cancer Father        lung  . Hyperlipidemia Maternal Grandfather   . Heart disease Maternal Grandfather   . Stroke Paternal Grandmother   . Heart disease Paternal Grandmother   . Hyperlipidemia Paternal Grandfather   . Diabetes Neg Hx    Social History   Socioeconomic History  . Marital status: Married    Spouse name: Not on file  . Number of children: 2  . Years of education: Not on file  . Highest education level: Not on file  Occupational History  . Occupation: retired  Scientific laboratory technician  . Financial resource strain: Not hard at all  . Food insecurity:    Worry: Never true    Inability: Never true  . Transportation needs:    Medical: No    Non-medical: No  Tobacco Use  . Smoking status: Never Smoker  . Smokeless tobacco: Never Used  Substance and Sexual Activity  . Alcohol use: Yes    Alcohol/week: 1.0 standard drinks    Types: 1 Cans of beer per week    Comment: occassionally  . Drug use: No  . Sexual activity: Yes  Lifestyle  . Physical activity:    Days  per week: 3 days    Minutes per session: 60 min  . Stress: Not at all  Relationships  . Social connections:    Talks on phone: More than three times a week    Gets together: More than three times a week    Attends religious service: More than 4 times per year    Active member of club or organization: Yes    Attends meetings of clubs or organizations: More than 4 times per year    Relationship status: Married  Other Topics Concern  . Not on file  Social History Narrative  . Not on file    Outpatient Encounter Medications as of 04/24/2019  Medication Sig  . acetaminophen  (TYLENOL) 500 MG tablet Take 500 mg by mouth every 6 (six) hours as needed.  . calcium carbonate (OS-CAL) 600 MG TABS tablet Take 600 mg by mouth 2 (two) times daily with a meal.  . finasteride (PROSCAR) 5 MG tablet TAKE 1 TABLET DAILY  . Glucosamine-Chondroitin 250-200 MG TABS Take by mouth 2 (two) times daily.  . hydrochlorothiazide (HYDRODIURIL) 25 MG tablet TAKE 1 TABLET DAILY  . lisinopril (PRINIVIL,ZESTRIL) 40 MG tablet TAKE 1 TABLET DAILY  . Multiple Vitamins-Minerals (CENTRUM SILVER ULTRA MENS) TABS Take by mouth daily.  Marland Kitchen omeprazole (PRILOSEC) 20 MG capsule Take 20 mg by mouth daily.   No facility-administered encounter medications on file as of 04/24/2019.     Activities of Daily Living In your present state of health, do you have any difficulty performing the following activities: 04/24/2019  Hearing? N  Vision? N  Difficulty concentrating or making decisions? N  Walking or climbing stairs? N  Dressing or bathing? N  Doing errands, shopping? N  Preparing Food and eating ? N  Using the Toilet? N  In the past six months, have you accidently leaked urine? N  Do you have problems with loss of bowel control? N  Managing your Medications? N  Managing your Finances? N  Housekeeping or managing your Housekeeping? N  Some recent data might be hidden    Patient Care Team: Hoyt Koch, MD as PCP - General (Internal Medicine) Ardis Hughs, MD as Attending Physician (Urology)   Assessment:   This is a routine wellness examination for Christian Dodson. Physical assessment deferred to PCP.  Exercise Activities and Dietary recommendations Current Exercise Habits: Home exercise routine, Type of exercise: walking(plays golf), Time (Minutes): 60, Frequency (Times/Week): 3, Weekly Exercise (Minutes/Week): 180, Intensity: Mild, Exercise limited by: orthopedic condition(s)  Diet (meal preparation, eat out, water intake, caffeinated beverages, dairy products, fruits and vegetables): in  general, a "healthy" diet  , well balanced. eats a variety of fruits and vegetables daily, limits salt, fat/cholesterol, sugar,carbohydrates,caffeine, drinks 6-8 glasses of water daily.  Goals    . Be as healthy and active as long as possible     Continue to exercise and eat healthy    . Patient Stated     I want to lose weight by cutting back on the amount of sugar and snacks I eat. Continue to exercise. Enjoy life, family and play golf.    . Weight < 180 lb (81.647 kg)     Will start walking again; 5 days as tolerated;         Fall Risk Fall Risk  04/24/2019 04/17/2018 04/14/2017 03/24/2016 10/19/2013  Falls in the past year? 0 No Yes No No    Depression Screen PHQ 2/9 Scores 04/24/2019 04/17/2018 04/14/2017 04/14/2017  PHQ -  2 Score 0 0 0 0  PHQ- 9 Score - 0 0 -    Cognitive Function MMSE - Mini Mental State Exam 04/17/2018 04/17/2018  Not completed: (No Data) Refused       Ad8 score reviewed for issues:  Issues making decisions: no  Less interest in hobbies / activities: no  Repeats questions, stories (family complaining): no  Trouble using ordinary gadgets (microwave, computer, phone):no  Forgets the month or year: no  Mismanaging finances: no  Remembering appts: no  Daily problems with thinking and/or memory: no Ad8 score is= 0  Immunization History  Administered Date(s) Administered  . DT 12/21/2011  . Influenza, High Dose Seasonal PF 09/19/2013, 10/10/2016  . Influenza,inj,Quad PF,6+ Mos 09/03/2014  . Influenza-Unspecified 08/21/2015, 10/03/2017  . Pneumococcal Conjugate-13 03/24/2016  . Pneumococcal Polysaccharide-23 03/20/2013  . Tdap 10/03/2012  . Zoster 09/03/2014  . Zoster Recombinat (Shingrix) 10/31/2018, 01/30/2019   Screening Tests Health Maintenance  Topic Date Due  . PNA vac Low Risk Adult (2 of 2 - PPSV23) 03/20/2018  . INFLUENZA VACCINE  07/21/2019  . COLONOSCOPY  09/25/2020  . TETANUS/TDAP  10/03/2022  . Hepatitis C Screening  Completed        Plan:    Reviewed health maintenance screenings with patient today and relevant education, vaccines, and/or referrals were provided.   Continue to eat heart healthy diet (full of fruits, vegetables, whole grains, lean protein, water--limit salt, fat, and sugar intake) and increase physical activity as tolerated.  Continue doing brain stimulating activities (puzzles, reading, adult coloring books, staying active) to keep memory sharp.   I have personally reviewed and noted the following in the patient's chart:   . Medical and social history . Use of alcohol, tobacco or illicit drugs  . Current medications and supplements . Functional ability and status . Nutritional status . Physical activity . Advanced directives . List of other physicians . Screenings to include cognitive, depression, and falls . Referrals and appointments  In addition, I have reviewed and discussed with patient certain preventive protocols, quality metrics, and best practice recommendations. A written personalized care plan for preventive services as well as general preventive health recommendations were provided to patient.     Michiel Cowboy, RN  04/24/2019

## 2019-04-25 NOTE — Assessment & Plan Note (Signed)
Checking PSA and taking proscar and symptoms overall well controlled at this time.

## 2019-04-25 NOTE — Assessment & Plan Note (Signed)
Taking prilosec 20 mg daily and benefits outweigh the risk at this time. Refill if needed.

## 2019-04-25 NOTE — Assessment & Plan Note (Signed)
BP at goal with home readings, refill hctz and lisinopril. Checking CMP and adjust as needed.

## 2019-04-26 ENCOUNTER — Other Ambulatory Visit (INDEPENDENT_AMBULATORY_CARE_PROVIDER_SITE_OTHER): Payer: Medicare HMO

## 2019-04-26 DIAGNOSIS — R350 Frequency of micturition: Secondary | ICD-10-CM | POA: Diagnosis not present

## 2019-04-26 DIAGNOSIS — N401 Enlarged prostate with lower urinary tract symptoms: Secondary | ICD-10-CM

## 2019-04-26 LAB — COMPREHENSIVE METABOLIC PANEL
ALT: 38 U/L (ref 0–53)
AST: 20 U/L (ref 0–37)
Albumin: 4.7 g/dL (ref 3.5–5.2)
Alkaline Phosphatase: 77 U/L (ref 39–117)
BUN: 21 mg/dL (ref 6–23)
CO2: 25 mEq/L (ref 19–32)
Calcium: 9.7 mg/dL (ref 8.4–10.5)
Chloride: 105 mEq/L (ref 96–112)
Creatinine, Ser: 1.1 mg/dL (ref 0.40–1.50)
GFR: 66.03 mL/min (ref >60.00)
Glucose, Bld: 135 mg/dL — ABNORMAL HIGH (ref 70–99)
Potassium: 4 mEq/L (ref 3.5–5.1)
Sodium: 142 mEq/L (ref 135–145)
Total Bilirubin: 0.7 mg/dL (ref 0.2–1.2)
Total Protein: 7.3 g/dL (ref 6.0–8.3)

## 2019-04-26 LAB — PSA: PSA: 1.47 ng/mL (ref 0.10–4.00)

## 2019-04-26 LAB — LIPID PANEL
Cholesterol: 160 mg/dL (ref 0–200)
HDL: 32 mg/dL — ABNORMAL LOW (ref >39.00)
NonHDL: 127.61
Total CHOL/HDL Ratio: 5
Triglycerides: 237 mg/dL — ABNORMAL HIGH (ref 0.0–149.0)
VLDL: 47.4 mg/dL — ABNORMAL HIGH (ref 0.0–40.0)

## 2019-04-26 LAB — CBC
HCT: 45.1 % (ref 39.0–52.0)
Hemoglobin: 16 g/dL (ref 13.0–17.0)
MCHC: 35.4 g/dL (ref 30.0–36.0)
MCV: 92.4 fl (ref 78.0–100.0)
Platelets: 218 10*3/uL (ref 150.0–400.0)
RBC: 4.88 Mil/uL (ref 4.22–5.81)
RDW: 13.3 % (ref 11.5–15.5)
WBC: 7.6 10*3/uL (ref 4.0–10.5)

## 2019-04-26 LAB — LDL CHOLESTEROL, DIRECT: Direct LDL: 88 mg/dL

## 2019-06-25 DIAGNOSIS — R3915 Urgency of urination: Secondary | ICD-10-CM | POA: Diagnosis not present

## 2019-06-25 DIAGNOSIS — N5201 Erectile dysfunction due to arterial insufficiency: Secondary | ICD-10-CM | POA: Diagnosis not present

## 2019-06-25 DIAGNOSIS — N401 Enlarged prostate with lower urinary tract symptoms: Secondary | ICD-10-CM | POA: Diagnosis not present

## 2019-08-23 DIAGNOSIS — H40023 Open angle with borderline findings, high risk, bilateral: Secondary | ICD-10-CM | POA: Diagnosis not present

## 2019-08-23 DIAGNOSIS — H2513 Age-related nuclear cataract, bilateral: Secondary | ICD-10-CM | POA: Diagnosis not present

## 2019-08-23 DIAGNOSIS — H5203 Hypermetropia, bilateral: Secondary | ICD-10-CM | POA: Diagnosis not present

## 2019-08-23 DIAGNOSIS — D3132 Benign neoplasm of left choroid: Secondary | ICD-10-CM | POA: Diagnosis not present

## 2019-09-29 LAB — FECAL OCCULT BLOOD, IMMUNOCHEMICAL: IFOBT: NEGATIVE

## 2019-10-18 ENCOUNTER — Encounter: Payer: Self-pay | Admitting: Internal Medicine

## 2019-10-18 NOTE — Progress Notes (Signed)
Abstracted and sent to scan  

## 2019-10-25 ENCOUNTER — Encounter: Payer: Medicare HMO | Admitting: Internal Medicine

## 2019-10-30 ENCOUNTER — Encounter: Payer: Self-pay | Admitting: Internal Medicine

## 2019-10-30 ENCOUNTER — Ambulatory Visit (INDEPENDENT_AMBULATORY_CARE_PROVIDER_SITE_OTHER): Payer: Medicare HMO | Admitting: Internal Medicine

## 2019-10-30 ENCOUNTER — Other Ambulatory Visit: Payer: Self-pay

## 2019-10-30 VITALS — BP 140/90 | HR 90 | Temp 97.9°F | Ht 66.0 in | Wt 186.0 lb

## 2019-10-30 DIAGNOSIS — K219 Gastro-esophageal reflux disease without esophagitis: Secondary | ICD-10-CM | POA: Diagnosis not present

## 2019-10-30 DIAGNOSIS — R350 Frequency of micturition: Secondary | ICD-10-CM

## 2019-10-30 DIAGNOSIS — N401 Enlarged prostate with lower urinary tract symptoms: Secondary | ICD-10-CM | POA: Diagnosis not present

## 2019-10-30 DIAGNOSIS — I10 Essential (primary) hypertension: Secondary | ICD-10-CM | POA: Diagnosis not present

## 2019-10-30 DIAGNOSIS — Z0000 Encounter for general adult medical examination without abnormal findings: Secondary | ICD-10-CM | POA: Diagnosis not present

## 2019-10-30 NOTE — Assessment & Plan Note (Signed)
Taking omeprazole daily which mostly controls symptoms.

## 2019-10-30 NOTE — Assessment & Plan Note (Signed)
Sees urology and taking proscar.

## 2019-10-30 NOTE — Patient Instructions (Signed)

## 2019-10-30 NOTE — Assessment & Plan Note (Signed)
Flu shot up to date. Pneumonia complete. Shingrix complete. Tetanus up to date. Colonoscopy up to date. Counseled about sun safety and mole surveillance. Counseled about the dangers of distracted driving. Given 10 year screening recommendations.

## 2019-10-30 NOTE — Assessment & Plan Note (Signed)
Taking lisinopril and hctz. Recent CMP without indication for change.

## 2019-10-30 NOTE — Progress Notes (Signed)
   Subjective:   Patient ID: Christian Dodson, male    DOB: 07/13/1946, 73 y.o.   MRN: [id number]  HPI The patient is a 73 YO man coming in for physical.   PMH, [place], social history reviewed and updated  Review of Systems  Constitutional: Negative.   HENT: Negative.   Eyes: Negative.   Respiratory: Negative for cough, chest tightness and shortness of breath.   Cardiovascular: Negative for chest pain, palpitations and leg swelling.  Gastrointestinal: Negative for abdominal distention, abdominal pain, constipation, diarrhea, nausea and vomiting.  Musculoskeletal: Negative.   Skin: Negative.   Neurological: Negative.   Psychiatric/Behavioral: Negative.     Objective:  Physical Exam Constitutional:      Appearance: He is well-developed.  HENT:     Head: Normocephalic and atraumatic.  Neck:     Musculoskeletal: Normal range of motion.  Cardiovascular:     Rate and Rhythm: Normal rate and regular rhythm.  Pulmonary:     Effort: Pulmonary effort is normal. No respiratory distress.     Breath sounds: Normal breath sounds. No wheezing or rales.  Abdominal:     General: Bowel sounds are normal. There is no distension.     Palpations: Abdomen is soft.     Tenderness: There is no abdominal tenderness. There is no rebound.  Skin:    General: Skin is warm and dry.  Neurological:     Mental Status: He is alert and oriented to person, place, and time.     Coordination: Coordination normal.     Vitals:   10/30/19 0845 10/30/19 0918  BP: (!) 160/90 140/90  Pulse: 90   Temp: 97.9 F (36.6 C)   TempSrc: Oral   SpO2: 97%   Weight: 186 lb (84.4 kg)   Height: 5\' 6"  (1.676 m)     Assessment & Plan:

## 2019-11-01 ENCOUNTER — Encounter: Payer: Medicare HMO | Admitting: Internal Medicine

## 2020-02-03 ENCOUNTER — Ambulatory Visit: Payer: Medicare HMO

## 2020-04-18 ENCOUNTER — Other Ambulatory Visit: Payer: Self-pay | Admitting: Internal Medicine

## 2020-04-24 ENCOUNTER — Encounter: Payer: Self-pay | Admitting: Internal Medicine

## 2020-04-24 ENCOUNTER — Ambulatory Visit (INDEPENDENT_AMBULATORY_CARE_PROVIDER_SITE_OTHER): Payer: Medicare HMO

## 2020-04-24 ENCOUNTER — Ambulatory Visit: Payer: Medicare HMO | Admitting: Internal Medicine

## 2020-04-24 ENCOUNTER — Other Ambulatory Visit: Payer: Self-pay

## 2020-04-24 VITALS — BP 138/88 | HR 86 | Temp 98.6°F | Ht 66.0 in | Wt 186.8 lb

## 2020-04-24 VITALS — BP 120/88 | HR 74 | Temp 98.2°F | Resp 16 | Ht 66.0 in | Wt 187.0 lb

## 2020-04-24 DIAGNOSIS — Z0000 Encounter for general adult medical examination without abnormal findings: Secondary | ICD-10-CM | POA: Diagnosis not present

## 2020-04-24 DIAGNOSIS — N401 Enlarged prostate with lower urinary tract symptoms: Secondary | ICD-10-CM | POA: Diagnosis not present

## 2020-04-24 DIAGNOSIS — K219 Gastro-esophageal reflux disease without esophagitis: Secondary | ICD-10-CM | POA: Diagnosis not present

## 2020-04-24 DIAGNOSIS — I10 Essential (primary) hypertension: Secondary | ICD-10-CM

## 2020-04-24 DIAGNOSIS — R350 Frequency of micturition: Secondary | ICD-10-CM

## 2020-04-24 LAB — PSA: PSA: 1.62 ng/mL (ref 0.10–4.00)

## 2020-04-24 LAB — COMPREHENSIVE METABOLIC PANEL
ALT: 32 U/L (ref 0–53)
AST: 20 U/L (ref 0–37)
Albumin: 4.6 g/dL (ref 3.5–5.2)
Alkaline Phosphatase: 72 U/L (ref 39–117)
BUN: 18 mg/dL (ref 6–23)
CO2: 26 mEq/L (ref 19–32)
Calcium: 9.8 mg/dL (ref 8.4–10.5)
Chloride: 106 mEq/L (ref 96–112)
Creatinine, Ser: 1.11 mg/dL (ref 0.40–1.50)
GFR: 65.16 mL/min (ref >60.00)
Glucose, Bld: 138 mg/dL — ABNORMAL HIGH (ref 70–99)
Potassium: 3.9 mEq/L (ref 3.5–5.1)
Sodium: 139 mEq/L (ref 135–145)
Total Bilirubin: 0.7 mg/dL (ref 0.2–1.2)
Total Protein: 7.1 g/dL (ref 6.0–8.3)

## 2020-04-24 LAB — CBC
HCT: 42.7 % (ref 39.0–52.0)
Hemoglobin: 14.8 g/dL (ref 13.0–17.0)
MCHC: 34.8 g/dL (ref 30.0–36.0)
MCV: 92.4 fl (ref 78.0–100.0)
Platelets: 198 10*3/uL (ref 150.0–400.0)
RBC: 4.62 Mil/uL (ref 4.22–5.81)
RDW: 13.3 % (ref 11.5–15.5)
WBC: 6.5 10*3/uL (ref 4.0–10.5)

## 2020-04-24 LAB — LIPID PANEL
Cholesterol: 171 mg/dL (ref 0–200)
HDL: 35.7 mg/dL — ABNORMAL LOW (ref >39.00)
NonHDL: 135
Total CHOL/HDL Ratio: 5
Triglycerides: 232 mg/dL — ABNORMAL HIGH (ref 0.0–149.0)
VLDL: 46.4 mg/dL — ABNORMAL HIGH (ref 0.0–40.0)

## 2020-04-24 LAB — LDL CHOLESTEROL, DIRECT: Direct LDL: 97 mg/dL

## 2020-04-24 NOTE — Assessment & Plan Note (Signed)
Taking proscar with good control overall of symptoms. Checking PSA today.

## 2020-04-24 NOTE — Assessment & Plan Note (Signed)
Taking omeprazole daily without side effects. Talked about dietary changes as well.

## 2020-04-24 NOTE — Patient Instructions (Addendum)
Christian Dodson , Thank you for taking time to come for your Medicare Wellness Visit. I appreciate your ongoing commitment to your health goals. Please review the following plan we discussed and let me know if I can assist you in the future.   Screening recommendations/referrals: Colorectal Screening: last done 2010  Vision and Dental Exams: Recommended annual ophthalmology exams for early detection of glaucoma and other disorders of the eye Recommended annual dental exams for proper oral hygiene  Vaccinations: Influenza vaccine: 09/20/2019 Pneumococcal vaccine: completed; Pneumovax 03/20/2013, Prevnar 03/24/2016 Tdap vaccine: 10/03/2012; Due every 10 years Shingles vaccine: completed; Shingrix 10/31/2018, 01/30/2019 Covid vaccine: completed; [place] 01/24/2020, 02/14/2020  Advanced directives: Advance directives discussed with you today. Please bring a copy of your POA (Power of [place]) and/or Living Will to your next appointment.  Goals:  Recommend to drink at least 6-8 8oz glasses of water per day.  Recommend to exercise for at least 150 minutes per week.  Recommend to remove any items from the home that may cause slips or trips.  Recommend to decrease portion sizes by eating 3 small healthy meals and at least 2 healthy snacks per day.  Recommend to begin DASH diet as directed below  Recommend to continue efforts to reduce smoking habits until no longer smoking. Smoking Cessation literature is attached below.  Next appointment: Please schedule your Annual Wellness Visit with your Nurse Health Advisor in one year.  Preventive Care 72 Years and Older, Male Preventive care refers to lifestyle choices and visits with your health care provider that can promote health and wellness. What does preventive care include?  A yearly physical exam. This is also called an annual well check.  Dental exams once or twice a year.  Routine eye exams. Ask your health care provider how often you should have  your eyes checked.  Personal lifestyle choices, including:  Daily care of your teeth and gums.  Regular physical activity.  Eating a healthy diet.  Avoiding tobacco and drug use.  Limiting alcohol use.  Practicing safe sex.  Taking low doses of aspirin every day if recommended by your health care provider..  Taking vitamin and mineral supplements as recommended by your health care provider. What happens during an annual well check? The services and screenings done by your health care provider during your annual well check will depend on your age, overall health, lifestyle risk factors, and family history of disease. Counseling  Your health care provider may ask you questions about your:  Alcohol use.  Tobacco use.  Drug use.  Emotional well-being.  Home and relationship well-being.  Sexual activity.  Eating habits.  History of falls.  Memory and ability to understand (cognition).  Work and work Statistician. Screening  You may have the following tests or measurements:  Height, weight, and BMI.  Blood pressure.  Lipid and cholesterol levels. These may be checked every 5 years, or more frequently if you are over 43 years old.  Skin check.  Lung cancer screening. You may have this screening every year starting at age 80 if you have a 30-pack-year history of smoking and currently smoke or have quit within the past 15 years.  Fecal occult blood test (FOBT) of the stool. You may have this test every year starting at age 30.  Flexible sigmoidoscopy or colonoscopy. You may have a sigmoidoscopy every 5 years or a colonoscopy every 10 years starting at age 18.  Prostate cancer screening. Recommendations will vary depending on your family history and other  risks.  Hepatitis C blood test.  Hepatitis B blood test.  Sexually transmitted disease (STD) testing.  Diabetes screening. This is done by checking your blood sugar (glucose) after you have not eaten for a  while (fasting). You may have this done every 1-3 years.  Abdominal aortic aneurysm (AAA) screening. You may need this if you are a current or former smoker.  Osteoporosis. You may be screened starting at age 5 if you are at high risk. Talk with your health care provider about your test results, treatment options, and if necessary, the need for more tests. Vaccines  Your health care provider may recommend certain vaccines, such as:  Influenza vaccine. This is recommended every year.  Tetanus, diphtheria, and acellular pertussis (Tdap, Td) vaccine. You may need a Td booster every 10 years.  Zoster vaccine. You may need this after age 49.  Pneumococcal 13-valent conjugate (PCV13) vaccine. One dose is recommended after age 65.  Pneumococcal polysaccharide (PPSV23) vaccine. One dose is recommended after age 10. Talk to your health care provider about which screenings and vaccines you need and how often you need them. This information is not intended to replace advice given to you by your health care provider. Make sure you discuss any questions you have with your health care provider. Document Released: 01/02/2016 Document Revised: 08/25/2016 Document Reviewed: 10/07/2015 Elsevier Interactive Patient Education  2017 [place] Prevention in the Home Falls can cause injuries. They can happen to people of all ages. There are many things you can do to make your home safe and to help prevent falls. What can I do on the outside of my home?  Regularly fix the edges of walkways and driveways and fix any cracks.  Remove anything that might make you trip as you walk through a door, such as a raised step or threshold.  Trim any bushes or trees on the path to your home.  Use bright outdoor lighting.  Clear any walking paths of anything that might make someone trip, such as rocks or tools.  Regularly check to see if handrails are loose or broken. Make sure that both sides of any steps  have handrails.  Any raised decks and porches should have guardrails on the edges.  Have any leaves, snow, or ice cleared regularly.  Use sand or salt on walking paths during winter.  Clean up any spills in your garage right away. This includes oil or grease spills. What can I do in the bathroom?  Use night lights.  Install grab bars by the toilet and in the tub and shower. Do not use towel bars as grab bars.  Use non-skid mats or decals in the tub or shower.  If you need to sit down in the shower, use a plastic, non-slip stool.  Keep the floor dry. Clean up any water that spills on the floor as soon as it happens.  Remove soap buildup in the tub or shower regularly.  Attach bath mats securely with double-sided non-slip rug tape.  Do not have throw rugs and other things on the floor that can make you trip. What can I do in the bedroom?  Use night lights.  Make sure that you have a light by your bed that is easy to reach.  Do not use any sheets or blankets that are too big for your bed. They should not hang down onto the floor.  Have a firm chair that has side arms. You can use this for support  while you get dressed.  Do not have throw rugs and other things on the floor that can make you trip. What can I do in the kitchen?  Clean up any spills right away.  Avoid walking on wet floors.  Keep items that you use a lot in easy-to-reach places.  If you need to reach something above you, use a strong step stool that has a grab bar.  Keep electrical cords out of the way.  Do not use floor polish or wax that makes floors slippery. If you must use wax, use non-skid floor wax.  Do not have throw rugs and other things on the floor that can make you trip. What can I do with my stairs?  Do not leave any items on the stairs.  Make sure that there are handrails on both sides of the stairs and use them. Fix handrails that are broken or loose. Make sure that handrails are as  long as the stairways.  Check any carpeting to make sure that it is firmly attached to the stairs. Fix any carpet that is loose or worn.  Avoid having throw rugs at the top or bottom of the stairs. If you do have throw rugs, attach them to the floor with carpet tape.  Make sure that you have a light switch at the top of the stairs and the bottom of the stairs. If you do not have them, ask someone to add them for you. What else can I do to help prevent falls?  Wear shoes that:  Do not have high heels.  Have rubber bottoms.  Are comfortable and fit you well.  Are closed at the toe. Do not wear sandals.  If you use a stepladder:  Make sure that it is fully opened. Do not climb a closed stepladder.  Make sure that both sides of the stepladder are locked into place.  Ask someone to hold it for you, if possible.  Clearly mark and make sure that you can see:  Any grab bars or handrails.  First and last steps.  Where the edge of each step is.  Use tools that help you move around (mobility aids) if they are needed. These include:  Canes.  Walkers.  Scooters.  Crutches.  Turn on the lights when you go into a dark area. Replace any light bulbs as soon as they burn out.  Set up your furniture so you have a clear path. Avoid moving your furniture around.  If any of your floors are uneven, fix them.  If there are any pets around you, be aware of where they are.  Review your medicines with your doctor. Some medicines can make you feel dizzy. This can increase your chance of falling. Ask your doctor what other things that you can do to help prevent falls. This information is not intended to replace advice given to you by your health care provider. Make sure you discuss any questions you have with your health care provider. Document Released: 10/02/2009 Document Revised: 05/13/2016 Document Reviewed: 01/10/2015 Elsevier Interactive Patient Education  2017 [employer].

## 2020-04-24 NOTE — Assessment & Plan Note (Signed)
Checking CMP and adjust hctz/lisinopril 25/40 as needed. BP at goal.

## 2020-04-24 NOTE — Progress Notes (Signed)
Subjective:   Christian Dodson is a 74 y.o. male who presents for Medicare Annual/Subsequent preventive examination.  Review of Systems:  No ROS. Medicare Wellness Visit. Additional risk factors are reflected in social history. Cardiac Risk Factors include: advanced age (>84mn, >>52women);family history of premature cardiovascular disease;hypertension;male gender;obesity (BMI >30kg/m2)  Sleep Patterns: No sleep issues, feels rested on waking and sleeps 8 hours nightly; gets up to void 1-2 times nightly. Home Safety/Smoke Alarms: Feels safe in home; uses home alarm. Smoke alarms in place. Living environment:1-story home.  Lives with wife, no needs for DME, has two children and four grandchildren; good support system. Seat Belt Safety/Bike Helmet: Wears seat belt.    Objective:    Vitals: BP 120/88 (BP Location: Right Arm, Patient Position: Sitting, Cuff Size: Normal)   Pulse 74   Temp 98.2 F (36.8 C)   Resp 16   Ht 5' 6"  (1.676 m)   Wt 187 lb (84.8 kg)   SpO2 96%   BMI 30.18 kg/m   Body mass index is 30.18 kg/m.  Advanced Directives 04/24/2020 04/24/2019 04/17/2018 04/14/2017 03/24/2016  Does Patient Have a Medical Advance Directive? Yes Yes Yes Yes Yes  Type of Advance Directive Living will;Healthcare Power of [place] will -  Does patient want to make changes to medical advance directive? No - Patient declined - - - -  Copy of [place] Chart? No - copy requested No - copy requested No - copy requested Yes -    Tobacco Social History   Tobacco Use  Smoking Status Never Smoker  Smokeless Tobacco Never Used     Counseling given: No   Clinical Intake:  Pre-visit preparation completed: Yes  Pain : No/denies pain Pain Score: 0-No pain     BMI - recorded: 30.2 Nutritional Status: BMI > 30  Obese Nutritional Risks: None Diabetes: No   How often do you need to have someone help you when you read instructions, pamphlets, or other written materials from your doctor or pharmacy?: 1 - Never What is the last grade level you completed in school?: CBankerNeeded?: No  Comments: Married with 2 kids ands 4 grandchildren 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 BPH (benign prostatic hyperplasia) 10/19/2013  . GERD (gastroesophageal reflux disease)   . HTN (hypertension) 10/19/2013  . Hypertension    Past Surgical History:  Procedure Laterality Date  . JOINT REPLACEMENT    . left shoulder rotator and bicep  01/10/2017  . MEDIAL PARTIAL KNEE REPLACEMENT Right 2013   Family History  Problem Relation Age of Onset  . Hypertension Mother   . Cancer Father        lung  . Hyperlipidemia Maternal Grandfather   . Heart disease Maternal Grandfather   . Stroke Paternal Grandmother   . Heart disease Paternal Grandmother   . Hyperlipidemia Paternal Grandfather   . Diabetes Neg Hx    Social History   Socioeconomic History  . Marital status: Married    Spouse name: Not on file  . Number of children: 2  . Years of education: Not on file  . Highest education level: Not on file  Occupational History  . Occupation: retired  Tobacco Use  . Smoking status: Never Smoker  . Smokeless tobacco: Never Used  Substance and Sexual Activity  . Alcohol  use: Yes    Alcohol/week: 1.0 standard drinks    Types: 1 Cans of beer per week    Comment: occassionally  . Drug use: No  . Sexual activity: Yes  Other Topics Concern  . Not on file  Social History Narrative  . Not on file   Social Determinants of Health   Financial Resource Strain:   . Difficulty of Paying Living Expenses:   Food Insecurity:   . Worried About Charity fundraiser in the Last Year:   . Arboriculturist in the Last Year:   Transportation Needs:   . Film/video editor (Medical):    Marland Kitchen Lack of Transportation (Non-Medical):   Physical Activity:   . Days of Exercise per Week:   . Minutes of Exercise per Session:   Stress:   . Feeling of Stress :   Social Connections:   . Frequency of Communication with Friends and Family:   . Frequency of Social Gatherings with Friends and Family:   . Attends Religious Services:   . Active Member of Clubs or Organizations:   . Attends Archivist Meetings:   Marland Kitchen Marital Status:     Outpatient Encounter Medications as of 04/24/2020  Medication Sig  . acetaminophen (TYLENOL) 500 MG tablet Take 500 mg by mouth every 6 (six) hours as needed.  . calcium carbonate (OS-CAL) 600 MG TABS tablet Take 600 mg by mouth 2 (two) times daily with a meal.  . cholecalciferol (VITAMIN D3) 25 MCG (1000 UNIT) tablet Take 1,000 Units by mouth daily.  . finasteride (PROSCAR) 5 MG tablet TAKE 1 TABLET DAILY  . Glucosamine-Chondroitin 250-200 MG TABS Take by mouth 2 (two) times daily.  . hydrochlorothiazide (HYDRODIURIL) 25 MG tablet Take 1 tablet (25 mg total) by mouth daily.  Marland Kitchen lisinopril (ZESTRIL) 40 MG tablet Take 1 tablet (40 mg total) by mouth daily.  . Multiple Vitamins-Minerals (CENTRUM SILVER ULTRA MENS) TABS Take by mouth daily.  Marland Kitchen omeprazole (PRILOSEC) 20 MG capsule Take 20 mg by mouth daily.  Marland Kitchen zinc gluconate 50 MG tablet Take 50 mg by mouth daily.   No facility-administered encounter medications on file as of 04/24/2020.    Activities of Daily Living In your present state of health, do you have any difficulty performing the following activities: 04/24/2020  Hearing? Y  Vision? N  Difficulty concentrating or making decisions? N  Walking or climbing stairs? N  Dressing or bathing? N  Doing errands, shopping? N  Preparing Food and eating ? N  Using the Toilet? N  In the past six months, have you accidently leaked urine? N  Do you have problems with loss of bowel control? N  Managing your Medications? N  Managing your Finances? N   Housekeeping or managing your Housekeeping? N  Some recent data might be hidden    Patient Care Team: Hoyt Koch, MD as PCP - General (Internal Medicine) Ardis Hughs, MD as Attending Physician (Urology)   Assessment:   This is a routine wellness examination for [facility].  Exercise Activities and Dietary recommendations Current Exercise Habits: Home exercise routine(walks, plays golf and water aerobics), Type of exercise: walking;strength training/weights;stretching;treadmill;Other - see comments(swimming, golfing), Time (Minutes): 50, Frequency (Times/Week): 3, Weekly Exercise (Minutes/Week): 150, Intensity: Moderate, Exercise limited by: None identified  Goals    . Be as healthy and active as long as possible     Continue to exercise and eat healthy    . Patient Stated  I want to lose weight by cutting back on the amount of sugar and snacks I eat. Continue to exercise. Enjoy life, family and play golf.    . Weight < 180 lb (81.647 kg)     Will start walking again; 5 days as tolerated;         Fall Risk Fall Risk  04/24/2020 04/24/2019 04/17/2018 04/14/2017 03/24/2016  Falls in the past year? 0 0 No Yes No  Number falls in past yr: 0 - - - -  Injury with Fall? 0 - - - -  Risk for fall due to : No Fall Risks - - - -  Follow up Falls evaluation completed;Education provided - - - -   Is the patient's home free of loose throw rugs in walkways, pet beds, electrical cords, etc?   yes      Grab bars in the bathroom? no      Handrails on the stairs?   yes      Adequate lighting?   yes   Depression Screen PHQ 2/9 Scores 04/24/2020 04/24/2019 04/17/2018 04/14/2017  PHQ - 2 Score 0 0 0 0  PHQ- 9 Score - - 0 0    Cognitive Function MMSE - Mini Mental State Exam 04/17/2018 04/17/2018  Not completed: (No Data) Refused        Immunization History  Administered Date(s) Administered  . DT (Pediatric) 12/21/2011  . Influenza, High Dose Seasonal PF 09/19/2013, 10/10/2016   . Influenza,inj,Quad PF,6+ Mos 09/03/2014  . Influenza-Unspecified 08/21/2015, 10/03/2017, 09/20/2019  . Pneumococcal Conjugate-13 03/24/2016  . Pneumococcal Polysaccharide-23 03/20/2013  . Tdap 10/03/2012  . Zoster 09/03/2014  . Zoster Recombinat (Shingrix) 10/31/2018, 01/30/2019    Qualifies for Shingles Vaccine? completed  Screening Tests Health Maintenance  Topic Date Due  . COVID-19 Vaccine (1) Never done  . PNA vac Low Risk Adult (2 of 2 - PPSV23) 12/20/2048 (Originally 03/20/2018)  . INFLUENZA VACCINE  07/20/2020  . COLONOSCOPY  09/25/2020  . TETANUS/TDAP  10/03/2022  . Hepatitis C Screening  Completed   Cancer Screenings: Lung: Low Dose CT Chest recommended if Age 51-80 years, 30 pack-year currently smoking OR have quit w/in 15years. Patient does not qualify. Colorectal: Yes; Cologuard home testing kit      Plan:     Reviewed health maintenance screenings with patient today and relevant education, vaccines, and/or referrals were provided.    Continue doing brain stimulating activities (puzzles, reading, adult coloring books, staying active) to keep memory sharp.    Continue to eat heart healthy diet (full of fruits, vegetables, whole grains, lean protein, water--limit salt, fat, and sugar intake) and increase physical activity as tolerated.  I have personally reviewed and noted the following in the patient's chart:   . Medical and social history . Use of alcohol, tobacco or illicit drugs  . Current medications and supplements . Functional ability and status . Nutritional status . Physical activity . Advanced directives . List of other physicians . Hospitalizations, surgeries, and ER visits in previous 12 months . Vitals . Screenings to include cognitive, depression, and falls . Referrals and appointments  In addition, I have reviewed and discussed with patient certain preventive protocols, quality metrics, and best practice recommendations. A written  personalized care plan for preventive services as well as general preventive health recommendations were provided to patient.     Sheral Flow, LPN  0/02/2121 Nurse Health Advisor

## 2020-04-24 NOTE — Progress Notes (Signed)
   Subjective:   Patient ID: Christian Dodson, male    DOB: [birth year], 74 y.o.   MRN: [id number]  HPI The patient is a 74 YO man coming in for follow up BPH (symptoms overall tolerable, taking proscar daily, denies pain with urination, denies blood in urine) and blood pressure (denies chest pains or headaches, taking hctz and lisinopril and doing well overall, denies side effects) and GERD (taking omeprazole and doing well overall, some worsening problems with dairy, especially ice cream, denies blood in stool).   Review of Systems  Constitutional: Negative.   HENT: Negative.   Eyes: Negative.   Respiratory: Negative for cough, chest tightness and shortness of breath.   Cardiovascular: Negative for chest pain, palpitations and leg swelling.  Gastrointestinal: Negative for abdominal distention, abdominal pain, constipation, diarrhea, nausea and vomiting.  Musculoskeletal: Negative.   Skin: Negative.   Neurological: Negative.   Psychiatric/Behavioral: Negative.     Objective:  Physical Exam Constitutional:      Appearance: He is well-developed.  HENT:     Head: Normocephalic and atraumatic.  Cardiovascular:     Rate and Rhythm: Normal rate and regular rhythm.  Pulmonary:     Effort: Pulmonary effort is normal. No respiratory distress.     Breath sounds: Normal breath sounds. No wheezing or rales.  Abdominal:     General: Bowel sounds are normal. There is no distension.     Palpations: Abdomen is soft.     Tenderness: There is no abdominal tenderness. There is no rebound.  Musculoskeletal:     Cervical back: Normal range of motion.  Skin:    General: Skin is warm and dry.  Neurological:     Mental Status: He is alert and oriented to person, place, and time.     Coordination: Coordination normal.     Vitals:   04/24/20 0855  BP: 138/88  Pulse: 86  Temp: 98.6 F (37 C)  SpO2: 98%  Weight: 186 lb 12.8 oz (84.7 kg)  Height: 5\' 6"  (1.676 m)    This visit occurred during  the SARS-CoV-2 public health emergency.  Safety protocols were in place, including screening questions prior to the visit, additional usage of staff PPE, and extensive cleaning of exam room while observing appropriate contact time as indicated for disinfecting solutions.   Assessment & Plan:

## 2020-04-24 NOTE — Patient Instructions (Signed)
Plantar Fasciitis Rehab Ask your health care provider which exercises are safe for you. Do exercises exactly as told by your health care provider and adjust them as directed. It is normal to feel mild stretching, pulling, tightness, or discomfort as you do these exercises. Stop right away if you feel sudden pain or your pain gets worse. Do not begin these exercises until told by your health care provider. Stretching and range-of-motion exercises These exercises warm up your muscles and joints and improve the movement and flexibility of your foot. These exercises also help to relieve pain. Plantar fascia stretch  1. Sit with your left / right leg crossed over your opposite knee. 2. Hold your heel with one hand with that thumb near your arch. With your other hand, hold your toes and gently pull them back toward the top of your foot. You should feel a stretch on the bottom of your toes or your foot (plantar fascia) or both. 3. Hold this stretch for__________ seconds. 4. Slowly release your toes and return to the starting position. Repeat __________ times. Complete this exercise __________ times a day. Gastrocnemius stretch, standing This exercise is also called a calf (gastroc) stretch. It stretches the muscles in the back of the upper calf. 1. Stand with your hands against a wall. 2. Extend your left / right leg behind you, and bend your front knee slightly. 3. Keeping your heels on the floor and your back knee straight, shift your weight toward the wall. Do not arch your back. You should feel a gentle stretch in your upper left / right calf. 4. Hold this position for __________ seconds. Repeat __________ times. Complete this exercise __________ times a day. Soleus stretch, standing This exercise is also called a calf (soleus) stretch. It stretches the muscles in the back of the lower calf. 1. Stand with your hands against a wall. 2. Extend your left / right leg behind you, and bend your front  knee slightly. 3. Keeping your heels on the floor, bend your back knee and shift your weight slightly over your back leg. You should feel a gentle stretch deep in your lower calf. 4. Hold this position for __________ seconds. Repeat __________ times. Complete this exercise __________ times a day. Gastroc and soleus stretch, standing step This exercise stretches the muscles in the back of the lower leg. These muscles are in the upper calf (gastrocnemius) and the lower calf (soleus). 1. Stand with the ball of your left / right foot on a step. The ball of your foot is on the walking surface, right under your toes. 2. Keep your other foot firmly on the same step. 3. Hold on to the wall or a railing for balance. 4. Slowly lift your other foot, allowing your body weight to press your left / right heel down over the edge of the step. You should feel a stretch in your left / right calf. 5. Hold this position for __________ seconds. 6. Return both feet to the step. 7. Repeat this exercise with a slight bend in your left / right knee. Repeat __________ times with your left / right knee straight and __________ times with your left / right knee bent. Complete this exercise __________ times a day. Balance exercise This exercise builds your balance and strength control of your arch to help take pressure off your plantar fascia. Single leg stand If this exercise is too easy, you can try it with your eyes closed or while standing on a pillow. 1.   Without shoes, stand near a railing or in a doorway. You may hold on to the railing or door frame as needed. 2. Stand on your left / right foot. Keep your big toe down on the floor and try to keep your arch lifted. Do not let your foot roll inward. 3. Hold this position for __________ seconds. Repeat __________ times. Complete this exercise __________ times a day. This information is not intended to replace advice given to you by your health care provider. Make sure  you discuss any questions you have with your health care provider. Document Revised: 03/29/2019 Document Reviewed: 10/04/2018 Elsevier Patient Education  2020 [employer]  

## 2020-05-13 ENCOUNTER — Other Ambulatory Visit: Payer: Self-pay | Admitting: Internal Medicine

## 2020-05-29 ENCOUNTER — Other Ambulatory Visit: Payer: Self-pay | Admitting: Internal Medicine

## 2020-08-09 ENCOUNTER — Encounter: Payer: Self-pay | Admitting: Internal Medicine

## 2020-09-09 ENCOUNTER — Encounter (HOSPITAL_COMMUNITY): Payer: Self-pay

## 2020-09-09 NOTE — Progress Notes (Signed)
COVID Vaccine Completed: Date COVID Vaccine completed: COVID vaccine manufacturer: [place]   PCP - Dr. [name] Cardiologist -   Chest x-ray -  EKG -  Stress Test -  ECHO -  Cardiac Cath -  Pacemaker/ICD device last checked:  Sleep Study -  CPAP -   Fasting Blood Sugar -  Checks Blood Sugar _____ times a day  Blood Thinner Instructions: Aspirin Instructions: Last Dose:  Anesthesia review:   Patient denies shortness of breath, fever, cough and chest pain at PAT appointment   Patient verbalized understanding of instructions that were given to them at the PAT appointment. Patient was also instructed that they will need to review over the PAT instructions again at home before surgery.

## 2020-09-09 NOTE — Patient Instructions (Addendum)
DUE TO COVID-19 ONLY ONE VISITOR IS ALLOWED TO COME WITH YOU AND STAY IN THE WAITING ROOM ONLY DURING PRE OP AND PROCEDURE.    COVID SWAB TESTING MUST BE COMPLETED ON:  Friday, Oct. 8, 2021 at 9:30 AM   [address] [place], [place] [address]  (Must self quarantine after testing. Follow instructions on handout.)   Your procedure is scheduled on: Tuesday, Oct. 12, 2021   Report to [facility] Main  Entrance    Report to admitting at 11:45 AM   Call this number if you have problems the morning of surgery [phone]   Do not eat food :After Midnight.   May have liquids until  11:15AM  day of surgery  CLEAR LIQUID DIET  Foods Allowed                                                                     Foods Excluded  Water, Black Coffee and tea, regular and decaf                             liquids that you cannot  Plain Jell-O in any flavor  (No red)                                           see through such as: Fruit ices (not with fruit pulp)                                     milk, soups, orange juice              Iced Popsicles (No red)                                    All solid food                                   Apple juices Sports drinks like Gatorade (No red) Lightly seasoned clear broth or consume(fat free) Sugar, honey syrup  Sample Menu Breakfast                                Lunch                                     Supper Cranberry juice                    Beef broth                            Chicken broth Jell-O  Grape juice                           Apple juice Coffee or tea                        Jell-O                                      Popsicle                                                Coffee or tea                        Coffee or tea      Complete one Ensure drink the morning of surgery at   11:15AM    the day of surgery.   Oral Hygiene is also important to reduce your risk of infection.                                     Remember - BRUSH YOUR TEETH THE MORNING OF SURGERY WITH YOUR REGULAR TOOTHPASTE   Do NOT smoke after Midnight   Take these medicines the morning of surgery with A SIP OF WATER: Omeprazole                               You may not have any metal on your body including  jewelry, and body piercings             Do not wear lotions, powders, perfumes/cologne, or deodorant                           Men may shave face and neck.   Do not bring valuables to the hospital. [place].   Contacts, dentures or bridgework may not be worn into surgery.    Patients discharged the day of surgery will not be allowed to drive home.   Special Instructions: Bring a copy of your healthcare power of attorney and living will documents         the day of surgery if you haven't scanned them in before.              Please read over the following fact sheets you were given: IF YOU HAVE QUESTIONS ABOUT YOUR PRE OP INSTRUCTIONS PLEASE CALL [phone]   [facility] - Preparing for Surgery Before surgery, you can play an important role.  Because skin is not sterile, your skin needs to be as free of germs as possible.  You can reduce the number of germs on your skin by washing with CHG (chlorahexidine gluconate) soap before surgery.  CHG is an antiseptic cleaner which kills germs and bonds with the skin to continue killing germs even after washing. Please DO NOT use if you have an allergy to CHG or antibacterial soaps.  If your skin becomes reddened/irritated stop  using the CHG and inform your nurse when you arrive at Short Stay. Do not shave (including legs and underarms) for at least 48 hours prior to the first CHG shower.  You may shave your face/neck.  Please follow these instructions carefully:  1.  Shower with CHG Soap the night before surgery and the  morning of surgery.  2.  If you choose to wash your hair, wash your hair first as usual  with your normal  shampoo.  3.  After you shampoo, rinse your hair and body thoroughly to remove the shampoo.                             4.  Use CHG as you would any other liquid soap.  You can apply chg directly to the skin and wash.  Gently with a scrungie or clean washcloth.  5.  Apply the CHG Soap to your body ONLY FROM THE NECK DOWN.   Do   not use on face/ open                           Wound or open sores. Avoid contact with eyes, ears mouth and   genitals (private parts).                       Wash face,  Genitals (private parts) with your normal soap.             6.  Wash thoroughly, paying special attention to the area where your    surgery  will be performed.  7.  Thoroughly rinse your body with warm water from the neck down.  8.  DO NOT shower/wash with your normal soap after using and rinsing off the CHG Soap.                9.  Pat yourself dry with a clean towel.            10.  Wear clean pajamas.            11.  Place clean sheets on your bed the night of your first shower and do not  sleep with pets. Day of Surgery : Do not apply any lotions/deodorants the morning of surgery.  Please wear clean clothes to the hospital/surgery center.  FAILURE TO FOLLOW THESE INSTRUCTIONS MAY RESULT IN THE CANCELLATION OF YOUR SURGERY  PATIENT SIGNATURE_________________________________  NURSE SIGNATURE__________________________________  ________________________________________________________________________   Adam Phenix  An incentive spirometer is a tool that can help keep your lungs clear and active. This tool measures how well you are filling your lungs with each breath. Taking long deep breaths may help reverse or decrease the chance of developing breathing (pulmonary) problems (especially infection) following:  A long period of time when you are unable to move or be active. BEFORE THE PROCEDURE   If the spirometer includes an indicator to show your best effort, your  nurse or respiratory therapist will set it to a desired goal.  If possible, sit up straight or lean slightly forward. Try not to slouch.  Hold the incentive spirometer in an upright position. INSTRUCTIONS FOR USE  1. Sit on the edge of your bed if possible, or sit up as far as you can in bed or on a chair. 2. Hold the incentive spirometer in an upright position. 3. Breathe out normally. 4. Place the mouthpiece in  your mouth and seal your lips tightly around it. 5. Breathe in slowly and as deeply as possible, raising the piston or the ball toward the top of the column. 6. Hold your breath for 3-5 seconds or for as long as possible. Allow the piston or ball to fall to the bottom of the column. 7. Remove the mouthpiece from your mouth and breathe out normally. 8. Rest for a few seconds and repeat Steps 1 through 7 at least 10 times every 1-2 hours when you are awake. Take your time and take a few normal breaths between deep breaths. 9. The spirometer may include an indicator to show your best effort. Use the indicator as a goal to work toward during each repetition. 10. After each set of 10 deep breaths, practice coughing to be sure your lungs are clear. If you have an incision (the cut made at the time of surgery), support your incision when coughing by placing a pillow or rolled up towels firmly against it. Once you are able to get out of bed, walk around indoors and cough well. You may stop using the incentive spirometer when instructed by your caregiver.  RISKS AND COMPLICATIONS  Take your time so you do not get dizzy or light-headed.  If you are in pain, you may need to take or ask for pain medication before doing incentive spirometry. It is harder to take a deep breath if you are having pain. AFTER USE  Rest and breathe slowly and easily.  It can be helpful to keep track of a log of your progress. Your caregiver can provide you with a simple table to help with this. If you are using the  spirometer at home, follow these instructions: [place] IF:   You are having difficultly using the spirometer.  You have trouble using the spirometer as often as instructed.  Your pain medication is not giving enough relief while using the spirometer.  You develop fever of 100.5 F (38.1 C) or higher. SEEK IMMEDIATE MEDICAL CARE IF:   You cough up bloody sputum that had not been present before.  You develop fever of 102 F (38.9 C) or greater.  You develop worsening pain at or near the incision site. MAKE SURE YOU:   Understand these instructions.  Will watch your condition.  Will get help right away if you are not doing well or get worse. Document Released: 04/18/2007 Document Revised: 02/28/2012 Document Reviewed: 06/19/2007 Exit[employer] Patient Information 2014 Exit[employer], [place].   ________________________________________________________________________  WHAT IS A BLOOD TRANSFUSION? Blood Transfusion Information  A transfusion is the replacement of blood or some of its parts. Blood is made up of multiple cells which provide different functions.  Red blood cells carry oxygen and are used for blood loss replacement.  White blood cells fight against infection.  Platelets control bleeding.  Plasma helps clot blood.  Other blood products are available for specialized needs, such as hemophilia or other clotting disorders. BEFORE THE TRANSFUSION  Who gives blood for transfusions?   Healthy volunteers who are fully evaluated to make sure their blood is safe. This is blood bank blood. Transfusion therapy is the safest it has ever been in the practice of medicine. Before blood is taken from a donor, a complete history is taken to make sure that person has no history of diseases nor engages in risky social behavior (examples are intravenous drug use or sexual activity with multiple partners). The donor's travel history is screened to minimize risk of  transmitting  infections, such as malaria. The donated blood is tested for signs of infectious diseases, such as HIV and hepatitis. The blood is then tested to be sure it is compatible with you in order to minimize the chance of a transfusion reaction. If you or a relative donates blood, this is often done in anticipation of surgery and is not appropriate for emergency situations. It takes many days to process the donated blood. RISKS AND COMPLICATIONS Although transfusion therapy is very safe and saves many lives, the main dangers of transfusion include:   Getting an infectious disease.  Developing a transfusion reaction. This is an allergic reaction to something in the blood you were given. Every precaution is taken to prevent this. The decision to have a blood transfusion has been considered carefully by your caregiver before blood is given. Blood is not given unless the benefits outweigh the risks. AFTER THE TRANSFUSION  Right after receiving a blood transfusion, you will usually feel much better and more energetic. This is especially true if your red blood cells have gotten low (anemic). The transfusion raises the level of the red blood cells which carry oxygen, and this usually causes an energy increase.  The nurse administering the transfusion will monitor you carefully for complications. HOME CARE INSTRUCTIONS  No special instructions are needed after a transfusion. You may find your energy is better. Speak with your caregiver about any limitations on activity for underlying diseases you may have. SEEK MEDICAL CARE IF:   Your condition is not improving after your transfusion.  You develop redness or irritation at the intravenous (IV) site. SEEK IMMEDIATE MEDICAL CARE IF:  Any of the following symptoms occur over the next 12 hours:  Shaking chills.  You have a temperature by mouth above 102 F (38.9 C), not controlled by medicine.  Chest, back, or muscle pain.  People around you feel you are  not acting correctly or are confused.  Shortness of breath or difficulty breathing.  Dizziness and fainting.  You get a rash or develop hives.  You have a decrease in urine output.  Your urine turns a dark color or changes to pink, red, or brown. Any of the following symptoms occur over the next 10 days:  You have a temperature by mouth above 102 F (38.9 C), not controlled by medicine.  Shortness of breath.  Weakness after normal activity.  The white part of the eye turns yellow (jaundice).  You have a decrease in the amount of urine or are urinating less often.  Your urine turns a dark color or changes to pink, red, or brown. Document Released: 12/03/2000 Document Revised: 02/28/2012 Document Reviewed: 07/22/2008 [facility] Patient Information 2014 [place], [place].  _______________________________________________________________________

## 2020-09-09 NOTE — Progress Notes (Addendum)
COVID Vaccine Completed:Yes Date COVID Vaccine completed: 01/24/20, 02/14/20 COVID vaccine manufacturer: [employer]      PCP - Dr. [name] last office visit note 04/24/20 in epic (Dr. Quay Burow) Cardiologist - N/A  Chest x-ray - greater than 1 year EKG - 09/18/20 in epic (pre op) Stress Test - greater than 2 years ECHO - N/A Cardiac Cath - N/A Pacemaker/ICD device last checked: N/A  Sleep Study - N/A CPAP - N/A  Fasting Blood Sugar - N/A Checks Blood Sugar __N/A___ times a day  Blood Thinner Instructions: N/A Aspirin Instructions: N/A Last Dose: N/A  Anesthesia review: N/A  Patient denies shortness of breath, fever, cough and chest pain at PAT appointment   Patient verbalized understanding of instructions that were given to them at the PAT appointment. Patient was also instructed that they will need to review over the PAT instructions again at home before surgery.

## 2020-09-10 DIAGNOSIS — M1612 Unilateral primary osteoarthritis, left hip: Secondary | ICD-10-CM | POA: Insufficient documentation

## 2020-09-15 NOTE — H&P (Signed)
TOTAL HIP ADMISSION H&P  Patient is admitted for left total hip arthroplasty, anterior approach.  Subjective:  Chief Complaint:   Left hip OA / pain  HPI: Christian Dodson, 74 y.o. male, has a history of pain and functional disability in the left hip(s) due to arthritis and patient has failed non-surgical conservative treatments for greater than 12 weeks to include NSAID's and/or analgesics and activity modification.  Onset of symptoms was gradual starting <1 year ago with gradually worsening course since that time.The patient noted no past surgery on the left hip(s).  Patient currently rates pain in the left hip at 7 out of 10 with activity. Patient has night pain, worsening of pain with activity and weight bearing, trendelenberg gait, pain that interfers with activities of daily living and pain with passive range of motion. Patient has evidence of periarticular osteophytes and joint space narrowing by imaging studies. This condition presents safety issues increasing the risk of falls.   There is no current active infection.  Risks, benefits and expectations were discussed with the patient.  Risks including but not limited to the risk of anesthesia, blood clots, nerve damage, blood vessel damage, failure of the prosthesis, infection and up to and including death.  Patient understand the risks, benefits and expectations and wishes to proceed with surgery.   PCP: Hoyt Koch, MD  D/C Plans:       Home (would like to spend 1 night, but willing to go home same day if needs to)  Post-op Meds:       Rx given for ASA, Robaxin, Norco, Iron, Colace and MiraLax  Tranexamic Acid:      To be given - IV   Decadron:      Is to be given  FYI:      ASA  Norco  DME:    Rx given for - 3-n-1  PT:   HEP  Pharmacy: [place], [place]   Patient Active Problem List   Diagnosis Date Noted  . Routine general medical examination at a health care facility 03/26/2015  . HTN (hypertension)  10/19/2013  . GERD (gastroesophageal reflux disease) 10/19/2013  . BPH (benign prostatic hyperplasia) 10/19/2013   Past Medical History:  Diagnosis Date  . BPH (benign prostatic hyperplasia) 10/19/2013  . GERD (gastroesophageal reflux disease)   . Hypertension     Past Surgical History:  Procedure Laterality Date  . JOINT REPLACEMENT    . left shoulder rotator and bicep  01/10/2017  . MEDIAL PARTIAL KNEE REPLACEMENT Right 2013    No current facility-administered medications for this encounter.   Current Outpatient Medications  Medication Sig Dispense Refill Last Dose  . acetaminophen (TYLENOL) 500 MG tablet Take 500 mg by mouth every 6 (six) hours as needed for moderate pain.      . cholecalciferol (VITAMIN D3) 25 MCG (1000 UNIT) tablet Take 1,000 Units by mouth daily.     . finasteride (PROSCAR) 5 MG tablet TAKE 1 TABLET DAILY (Patient taking differently: Take 5 mg by mouth daily. ) 90 tablet 2   . GLUCOSAMINE-CHONDROITIN PO Take 1 tablet by mouth 2 (two) times daily.      . hydrochlorothiazide (HYDRODIURIL) 25 MG tablet TAKE 1 TABLET DAILY (Patient taking differently: Take 25 mg by mouth daily. ) 90 tablet 1   . lisinopril (ZESTRIL) 40 MG tablet TAKE 1 TABLET DAILY (Patient taking differently: Take 40 mg by mouth daily. ) 90 tablet 1   . omeprazole (PRILOSEC) 20 MG capsule Take  20 mg by mouth daily.     Marland Kitchen zinc gluconate 50 MG tablet Take 50 mg by mouth daily.     . calcium carbonate (OS-CAL) 600 MG TABS tablet Take 600 mg by mouth 2 (two) times daily with a meal. (Patient not taking: Reported on 09/10/2020)   Not Taking at Unknown time  . Multiple Vitamins-Minerals (CENTRUM SILVER ULTRA MENS) TABS Take by mouth daily. (Patient not taking: Reported on 09/10/2020)   Not Taking at Unknown time  . zinc sulfate 220 (50 Zn) MG capsule Take 220 mg by mouth daily. (Patient not taking: Reported on 09/10/2020)   Not Taking at Unknown time   No Known Allergies   Social History   Tobacco Use   . Smoking status: Never Smoker  . Smokeless tobacco: Never Used  Substance Use Topics  . Alcohol use: Yes    Alcohol/week: 1.0 standard drink    Types: 1 Cans of beer per week    Comment: occassionally    Family History  Problem Relation Age of Onset  . Hypertension Mother   . Cancer Father        lung  . Hyperlipidemia Maternal Grandfather   . Heart disease Maternal Grandfather   . Stroke Paternal Grandmother   . Heart disease Paternal Grandmother   . Hyperlipidemia Paternal Grandfather   . Diabetes Neg Hx      Review of Systems  Constitutional: Negative.   HENT: Negative.   Eyes: Negative.   Respiratory: Negative.   Cardiovascular: Negative.   Gastrointestinal: Positive for heartburn.  Genitourinary: Negative.   Musculoskeletal: Positive for joint pain.  Skin: Negative.   Neurological: Negative.   Endo/Heme/Allergies: Negative.   Psychiatric/Behavioral: Negative.      Objective:  Physical Exam Constitutional:      Appearance: He is well-developed.  HENT:     Head: Normocephalic.  Eyes:     Pupils: Pupils are equal, round, and reactive to light.  Neck:     Thyroid: No thyromegaly.     Vascular: No JVD.     Trachea: No tracheal deviation.  Cardiovascular:     Rate and Rhythm: Normal rate and regular rhythm.  Pulmonary:     Effort: Pulmonary effort is normal. No respiratory distress.     Breath sounds: Normal breath sounds. No wheezing.  Abdominal:     Palpations: Abdomen is soft.     Tenderness: There is no abdominal tenderness. There is no guarding.  Musculoskeletal:     Cervical back: Neck supple.     Left hip: Tenderness and bony tenderness present. Decreased range of motion. Decreased strength.  Lymphadenopathy:     Cervical: No cervical adenopathy.  Skin:    General: Skin is warm and dry.  Neurological:     Mental Status: He is alert and oriented to person, place, and time.       Labs:  Estimated body mass index is 30.15 kg/m as  calculated from the following:   Height as of 04/24/20: 5\' 6"  (1.676 m).   Weight as of 04/24/20: 84.7 kg.   Imaging Review Plain radiographs demonstrate severe degenerative joint disease of the left hip. The bone quality appears to be good for age and reported activity level.      Assessment/Plan:  End stage arthritis, left hip  The patient history, physical examination, clinical judgement of the provider and imaging studies are consistent with end stage degenerative joint disease of the left hip and total hip arthroplasty is deemed medically necessary.  The treatment options including medical management, injection therapy, arthroscopy and arthroplasty were discussed at length. The risks and benefits of total hip arthroplasty were presented and reviewed. The risks due to aseptic loosening, infection, stiffness, dislocation/subluxation,  thromboembolic complications and other imponderables were discussed.  The patient acknowledged the explanation, agreed to proceed with the plan and consent was signed. Patient is being admitted for treatment for surgery, pain control, PT, OT, prophylactic antibiotics, VTE prophylaxis, progressive ambulation and ADL's and discharge planning.The patient is planning to be discharged home.

## 2020-09-18 ENCOUNTER — Encounter (HOSPITAL_COMMUNITY)
Admission: RE | Admit: 2020-09-18 | Discharge: 2020-09-18 | Disposition: A | Discharge status: Home / Self Care | Payer: Medicare HMO | Source: Ambulatory Visit | Attending: Orthopedic Surgery | Admitting: Orthopedic Surgery

## 2020-09-18 ENCOUNTER — Encounter (HOSPITAL_COMMUNITY): Payer: Self-pay

## 2020-09-18 ENCOUNTER — Other Ambulatory Visit: Payer: Self-pay

## 2020-09-18 DIAGNOSIS — Z01818 Encounter for other preprocedural examination: Secondary | ICD-10-CM | POA: Diagnosis present

## 2020-09-18 HISTORY — DX: Pneumonia, unspecified organism: J18.9

## 2020-09-18 HISTORY — DX: Prediabetes: R73.03

## 2020-09-18 HISTORY — DX: Personal history of peptic ulcer disease: Z87.11

## 2020-09-18 HISTORY — DX: Unspecified osteoarthritis, unspecified site: M19.90

## 2020-09-18 HISTORY — DX: Unspecified malignant neoplasm of skin, unspecified: C44.90

## 2020-09-18 LAB — SURGICAL PCR SCREEN
MRSA, PCR: NEGATIVE
Staphylococcus aureus: POSITIVE — AB

## 2020-09-18 LAB — CBC
HCT: 43.8 % (ref 39.0–52.0)
Hemoglobin: 15.7 g/dL (ref 13.0–17.0)
MCH: 32.6 pg (ref 26.0–34.0)
MCHC: 35.8 g/dL (ref 30.0–36.0)
MCV: 91.1 fL (ref 80.0–100.0)
Platelets: 206 10*3/uL (ref 150–400)
RBC: 4.81 MIL/uL (ref 4.22–5.81)
RDW: 12.8 % (ref 11.5–15.5)
WBC: 6.5 10*3/uL (ref 4.0–10.5)
nRBC: 0 % (ref 0.0–0.2)

## 2020-09-18 LAB — BASIC METABOLIC PANEL
Anion gap: 11 (ref 5–15)
BUN: 22 mg/dL (ref 8–23)
CO2: 22 mmol/L (ref 22–32)
Calcium: 9.7 mg/dL (ref 8.9–10.3)
Chloride: 103 mmol/L (ref 98–111)
Creatinine, Ser: 1.03 mg/dL (ref 0.61–1.24)
GFR calc Af Amer: >60 mL/min (ref >60)
GFR calc non Af Amer: >60 mL/min (ref >60)
Glucose, Bld: 216 mg/dL — ABNORMAL HIGH (ref 70–99)
Potassium: 3.7 mmol/L (ref 3.5–5.1)
Sodium: 136 mmol/L (ref 135–145)

## 2020-09-18 LAB — TYPE AND SCREEN
ABO/RH(D): O POS
Antibody Screen: NEGATIVE

## 2020-09-18 LAB — HEMOGLOBIN A1C
Hgb A1c MFr Bld: 6.2 % — ABNORMAL HIGH (ref 4.8–5.6)
Mean Plasma Glucose: 131.24 mg/dL

## 2020-09-18 NOTE — Progress Notes (Signed)
PCR result 09/18/2020 faxed to Dr. Alvan Dame via epic.

## 2020-09-18 NOTE — Progress Notes (Signed)
Subjective:    Patient ID: Christian Dodson, male    DOB: Nov 22, 1946, 74 y.o.   MRN: [id number]  HPI He is here for pre-operative clearance at the request of Dr Alvan Dame for total left hip arthroplasty scheduled for 09/30/2020.   He denies any personal or family history of problems with anesthesia or bleeding/blood clot problems.    He has no concerns.  He is taking all his medication as prescribed.   He is exercising some - golf.  With his daily activities he denies chest pain, palpitations, SOB and lightheadedness.     He had his pre-op visit yesterday and had blood work and an EKG done.    Medications and allergies reviewed with patient and updated if appropriate.  Patient Active Problem List   Diagnosis Date Noted  . Prediabetes 09/19/2020  . Osteoarthritis of left hip 09/10/2020  . Routine general medical examination at a health care facility 03/26/2015  . HTN (hypertension) 10/19/2013  . GERD (gastroesophageal reflux disease) 10/19/2013  . BPH (benign prostatic hyperplasia) 10/19/2013    Current Outpatient Medications on File Prior to Visit  Medication Sig Dispense Refill  . acetaminophen (TYLENOL) 500 MG tablet Take 500 mg by mouth every 6 (six) hours as needed for moderate pain.     . cholecalciferol (VITAMIN D3) 25 MCG (1000 UNIT) tablet Take 1,000 Units by mouth daily.    . finasteride (PROSCAR) 5 MG tablet TAKE 1 TABLET DAILY (Patient taking differently: Take 5 mg by mouth daily. ) 90 tablet 2  . GLUCOSAMINE-CHONDROITIN PO Take 1 tablet by mouth 2 (two) times daily.     . hydrochlorothiazide (HYDRODIURIL) 25 MG tablet TAKE 1 TABLET DAILY (Patient taking differently: Take 25 mg by mouth daily. ) 90 tablet 1  . lisinopril (ZESTRIL) 40 MG tablet TAKE 1 TABLET DAILY (Patient taking differently: Take 40 mg by mouth daily. ) 90 tablet 1  . omeprazole (PRILOSEC) 20 MG capsule Take 20 mg by mouth daily.    Marland Kitchen zinc gluconate 50 MG tablet Take 50 mg by mouth daily.     No current  facility-administered medications on file prior to visit.    Past Medical History:  Diagnosis Date  . Arthritis   . BPH (benign prostatic hyperplasia) 10/19/2013  . GERD (gastroesophageal reflux disease)   . History of stomach ulcers    age 44  . Hypertension   . Pneumonia    x4  . Pre-diabetes   . Skin cancer     Past Surgical History:  Procedure Laterality Date  . APPENDECTOMY    . COLONOSCOPY    . INGUINAL HERNIA REPAIR Right   . left shoulder rotator and bicep  01/10/2017  . MEDIAL PARTIAL KNEE REPLACEMENT Right 2013  . TONSILLECTOMY AND ADENOIDECTOMY    . UPPER GI ENDOSCOPY      Social History   Socioeconomic History  . Marital status: Married    Spouse name: Not on file  . Number of children: 2  . Years of education: Not on file  . Highest education level: Not on file  Occupational History  . Occupation: retired  Tobacco Use  . Smoking status: Never Smoker  . Smokeless tobacco: Never Used  Vaping Use  . Vaping Use: Never used  Substance and Sexual Activity  . Alcohol use: Yes    Alcohol/week: 1.0 standard drink    Types: 1 Cans of beer per week    Comment: occassionally  . Drug use: No  . Sexual  activity: Yes  Other Topics Concern  . Not on file  Social History Narrative  . Not on file   Social Determinants of Health   Financial Resource Strain:   . Difficulty of Paying Living Expenses: Not on file  Food Insecurity:   . Worried About Charity fundraiser in the Last Year: Not on file  . Ran Out of Food in the Last Year: Not on file  Transportation Needs:   . Lack of Transportation (Medical): Not on file  . Lack of Transportation (Non-Medical): Not on file  Physical Activity:   . Days of Exercise per Week: Not on file  . Minutes of Exercise per Session: Not on file  Stress:   . Feeling of Stress : Not on file  Social Connections:   . Frequency of Communication with Friends and Family: Not on file  . Frequency of Social Gatherings with Friends  and Family: Not on file  . Attends Religious Services: Not on file  . Active Member of Clubs or Organizations: Not on file  . Attends Archivist Meetings: Not on file  . Marital Status: Not on file    Family History  Problem Relation Age of Onset  . Hypertension Mother   . Cancer Father        lung  . Hyperlipidemia Maternal Grandfather   . Heart disease Maternal Grandfather   . Stroke Paternal Grandmother   . Heart disease Paternal Grandmother   . Hyperlipidemia Paternal Grandfather   . Diabetes Neg Hx     Review of Systems  Constitutional: Negative for chills and fever.  Eyes: Negative for visual disturbance.  Respiratory: Negative for cough, shortness of breath and wheezing.   Cardiovascular: Negative for chest pain, palpitations and leg swelling.  Gastrointestinal: Negative for abdominal pain, blood in stool, constipation, diarrhea and nausea.  Genitourinary: Negative for dysuria and hematuria.  Neurological: Positive for headaches (occ sinus). Negative for light-headedness.  Psychiatric/Behavioral: Negative for dysphoric mood. The patient is not nervous/anxious.        Objective:   Vitals:   09/19/20 1547  BP: 130/88  Pulse: (!) 105  Temp: 98.6 F (37 C)  SpO2: 97%   Filed Weights   09/19/20 1547  Weight: 183 lb (83 kg)   Body mass index is 29.54 kg/m.  BP Readings from Last 3 Encounters:  09/19/20 130/88  09/18/20 134/82  04/24/20 138/88    Wt Readings from Last 3 Encounters:  09/19/20 183 lb (83 kg)  09/18/20 183 lb (83 kg)  04/24/20 186 lb 12.8 oz (84.7 kg)     Physical Exam Constitutional: He appears well-developed and well-nourished. No distress.  HENT:  Head: Normocephalic and atraumatic.  Right Ear: External ear normal.  Left Ear: External ear normal.  Mouth/Throat: Oropharynx is clear and moist.  Normal ear canals and TM b/l  Eyes: Conjunctivae and EOM are normal.  Neck: Neck supple. No tracheal deviation present. No  thyromegaly present.  No carotid bruit  Cardiovascular: Normal rate, regular rhythm, normal heart sounds and intact distal pulses.   No murmur heard. Pulmonary/Chest: Effort normal and breath sounds normal. No respiratory distress. He has no wheezes. He has no rales.  Abdominal: Soft. He exhibits no distension. There is no tenderness.  Musculoskeletal: He exhibits no edema.  Lymphadenopathy:   He has no cervical adenopathy.  Skin: Skin is warm and dry. He is not diaphoretic.  Psychiatric: He has a normal mood and affect. His behavior is normal.  Lab Results  Component Value Date   WBC 6.5 09/18/2020   HGB 15.7 09/18/2020   HCT 43.8 09/18/2020   PLT 206 09/18/2020   GLUCOSE 216 (H) 09/18/2020   CHOL 171 04/24/2020   TRIG 232.0 (H) 04/24/2020   HDL 35.70 (L) 04/24/2020   LDLDIRECT 97.0 04/24/2020   LDLCALC 131 (H) 03/07/2014   ALT 32 04/24/2020   AST 20 04/24/2020   NA 136 09/18/2020   K 3.7 09/18/2020   CL 103 09/18/2020   CREATININE 1.03 09/18/2020   BUN 22 09/18/2020   CO2 22 09/18/2020   TSH 1.28 03/07/2014   PSA 1.62 04/24/2020   HGBA1C 6.2 (H) 09/18/2020        Assessment & Plan:     Preoperative visit: Preoperative visit of request from Dr. Alvan Dame for left hip arthroplasty planned for 09/30/2020 Current medical problems stable and well-controlled No symptoms suggestive of coronary artery disease and no history of it No concerning respiratory symptoms or history of COPD, asthma Sugars are controlled in prediabetic range  Overall low risk for low risk procedure-cleared for surgery Reviewed blood work from yesterday from his preop appointment at the hospital EKG done, but I am not able to visualize    See Problem List for Assessment and Plan of chronic medical problems.   This visit occurred during the SARS-CoV-2 public health emergency.  Safety protocols were in place, including screening questions prior to the visit, additional usage of staff PPE, and  extensive cleaning of exam room while observing appropriate contact time as indicated for disinfecting solutions.

## 2020-09-19 ENCOUNTER — Other Ambulatory Visit: Payer: Self-pay

## 2020-09-19 ENCOUNTER — Encounter: Payer: Self-pay | Admitting: Internal Medicine

## 2020-09-19 ENCOUNTER — Ambulatory Visit (INDEPENDENT_AMBULATORY_CARE_PROVIDER_SITE_OTHER): Payer: Medicare HMO | Admitting: Internal Medicine

## 2020-09-19 VITALS — BP 130/88 | HR 105 | Temp 98.6°F | Wt 183.0 lb

## 2020-09-19 DIAGNOSIS — Z01818 Encounter for other preprocedural examination: Secondary | ICD-10-CM | POA: Diagnosis not present

## 2020-09-19 DIAGNOSIS — K219 Gastro-esophageal reflux disease without esophagitis: Secondary | ICD-10-CM | POA: Diagnosis not present

## 2020-09-19 DIAGNOSIS — Z23 Encounter for immunization: Secondary | ICD-10-CM

## 2020-09-19 DIAGNOSIS — I10 Essential (primary) hypertension: Secondary | ICD-10-CM

## 2020-09-19 DIAGNOSIS — R7303 Prediabetes: Secondary | ICD-10-CM | POA: Diagnosis not present

## 2020-09-19 DIAGNOSIS — E119 Type 2 diabetes mellitus without complications: Secondary | ICD-10-CM | POA: Insufficient documentation

## 2020-09-19 NOTE — Patient Instructions (Signed)
We will send your surgerical form to Dr Alvan Dame.

## 2020-09-20 NOTE — Assessment & Plan Note (Signed)
Chronic GERD controlled Continue over-the-counter omeprazole 20 mg daily

## 2020-09-20 NOTE — Assessment & Plan Note (Signed)
Chronic  Lab Results  Component Value Date   HGBA1C 6.2 (H) 09/18/2020   Sugars stable in the prediabetic range Stressed regular exercise-especially after his hip replacement Low sugar/carbohydrate diet stressed

## 2020-09-20 NOTE — Assessment & Plan Note (Signed)
Chronic BP well controlled Continue hydrochlorothiazide 25 mg daily, lisinopril 40 mg daily cmp

## 2020-09-26 ENCOUNTER — Other Ambulatory Visit (HOSPITAL_COMMUNITY)
Admission: RE | Admit: 2020-09-26 | Discharge: 2020-09-26 | Disposition: A | Discharge status: Home / Self Care | Payer: Medicare HMO | Source: Ambulatory Visit | Attending: Orthopedic Surgery | Admitting: Orthopedic Surgery

## 2020-09-26 DIAGNOSIS — Z20822 Contact with and (suspected) exposure to covid-19: Secondary | ICD-10-CM | POA: Diagnosis not present

## 2020-09-26 DIAGNOSIS — Z01812 Encounter for preprocedural laboratory examination: Secondary | ICD-10-CM | POA: Insufficient documentation

## 2020-09-26 LAB — SARS CORONAVIRUS 2 (TAT 6-24 HRS): SARS Coronavirus 2: NEGATIVE

## 2020-09-29 NOTE — Progress Notes (Signed)
Called patient about time change for surgery on 09/30/20. He is to arrive 0900 for 1130 surgery. Complete ERAS drink by 0830. He verbalizes understanding.

## 2020-09-30 ENCOUNTER — Encounter (HOSPITAL_COMMUNITY): Payer: Self-pay | Admitting: Orthopedic Surgery

## 2020-09-30 ENCOUNTER — Observation Stay (HOSPITAL_COMMUNITY): Payer: Medicare HMO

## 2020-09-30 ENCOUNTER — Ambulatory Visit (HOSPITAL_COMMUNITY): Payer: Medicare HMO

## 2020-09-30 ENCOUNTER — Other Ambulatory Visit: Payer: Self-pay

## 2020-09-30 ENCOUNTER — Ambulatory Visit (HOSPITAL_COMMUNITY): Payer: Medicare HMO | Admitting: Registered Nurse

## 2020-09-30 ENCOUNTER — Encounter (HOSPITAL_COMMUNITY)
Admission: RE | Disposition: A | Discharge status: Home / Self Care | Payer: Self-pay | Source: Ambulatory Visit | Attending: Orthopedic Surgery

## 2020-09-30 ENCOUNTER — Observation Stay (HOSPITAL_COMMUNITY)
Admission: RE | Admit: 2020-09-30 | Discharge: 2020-10-01 | Disposition: A | Discharge status: Home / Self Care | Payer: Medicare HMO | Source: Ambulatory Visit | Attending: Orthopedic Surgery | Admitting: Orthopedic Surgery

## 2020-09-30 DIAGNOSIS — I10 Essential (primary) hypertension: Secondary | ICD-10-CM | POA: Insufficient documentation

## 2020-09-30 DIAGNOSIS — Z419 Encounter for procedure for purposes other than remedying health state, unspecified: Secondary | ICD-10-CM

## 2020-09-30 DIAGNOSIS — Z85828 Personal history of other malignant neoplasm of skin: Secondary | ICD-10-CM | POA: Diagnosis not present

## 2020-09-30 DIAGNOSIS — Z96649 Presence of unspecified artificial hip joint: Secondary | ICD-10-CM

## 2020-09-30 DIAGNOSIS — M1612 Unilateral primary osteoarthritis, left hip: Principal | ICD-10-CM | POA: Insufficient documentation

## 2020-09-30 DIAGNOSIS — Z79899 Other long term (current) drug therapy: Secondary | ICD-10-CM | POA: Insufficient documentation

## 2020-09-30 DIAGNOSIS — E663 Overweight: Secondary | ICD-10-CM | POA: Diagnosis present

## 2020-09-30 DIAGNOSIS — Z96651 Presence of right artificial knee joint: Secondary | ICD-10-CM | POA: Diagnosis not present

## 2020-09-30 DIAGNOSIS — Z96642 Presence of left artificial hip joint: Secondary | ICD-10-CM

## 2020-09-30 HISTORY — PX: TOTAL HIP ARTHROPLASTY: SHX124

## 2020-09-30 LAB — ABO/RH: ABO/RH(D): O POS

## 2020-09-30 SURGERY — ARTHROPLASTY, HIP, TOTAL, ANTERIOR APPROACH
Anesthesia: Monitor Anesthesia Care | Site: Hip | Laterality: Left

## 2020-09-30 MED ORDER — METHOCARBAMOL 500 MG IVPB - SIMPLE MED
500.0000 mg | Freq: Four times a day (QID) | INTRAVENOUS | Status: DC | PRN
  Administered 2020-09-30: 500 mg via INTRAVENOUS
  Filled 2020-09-30: qty 50

## 2020-09-30 MED ORDER — METHOCARBAMOL 500 MG PO TABS: 500.0000 mg | ORAL_TABLET | Freq: Four times a day (QID) | ORAL | 0 refills | Status: DC | PRN

## 2020-09-30 MED ORDER — ONDANSETRON HCL 4 MG/2ML IJ SOLN: 4.0000 mg | Freq: Four times a day (QID) | INTRAMUSCULAR | Status: DC | PRN

## 2020-09-30 MED ORDER — PHENOL 1.4 % MT LIQD: 1.0000 | OROMUCOSAL | Status: DC | PRN

## 2020-09-30 MED ORDER — STERILE WATER FOR IRRIGATION IR SOLN
Status: DC | PRN
  Administered 2020-09-30: 2000 mL

## 2020-09-30 MED ORDER — FERROUS SULFATE 325 (65 FE) MG PO TABS
325.0000 mg | ORAL_TABLET | Freq: Three times a day (TID) | ORAL | Status: DC
  Administered 2020-09-30 – 2020-10-01 (×3): 325 mg via ORAL
  Filled 2020-09-30 (×3): qty 1

## 2020-09-30 MED ORDER — ASPIRIN 81 MG PO CHEW: 81.0000 mg | CHEWABLE_TABLET | Freq: Two times a day (BID) | ORAL | 0 refills | Status: AC

## 2020-09-30 MED ORDER — OXYCODONE HCL 5 MG PO TABS
5.0000 mg | ORAL_TABLET | Freq: Once | ORAL | Status: AC | PRN
  Administered 2020-09-30: 5 mg via ORAL

## 2020-09-30 MED ORDER — CHLORHEXIDINE GLUCONATE 0.12 % MT SOLN
15.0000 mL | Freq: Once | OROMUCOSAL | Status: AC
  Administered 2020-09-30: 15 mL via OROMUCOSAL

## 2020-09-30 MED ORDER — OXYCODONE HCL 5 MG PO TABS
ORAL_TABLET | ORAL | Status: AC
  Filled 2020-09-30: qty 1

## 2020-09-30 MED ORDER — HYDROCHLOROTHIAZIDE 25 MG PO TABS
25.0000 mg | ORAL_TABLET | Freq: Every day | ORAL | Status: DC
  Administered 2020-09-30 – 2020-10-01 (×2): 25 mg via ORAL
  Filled 2020-09-30 (×2): qty 1

## 2020-09-30 MED ORDER — MIDAZOLAM HCL 2 MG/2ML IJ SOLN
INTRAMUSCULAR | Status: AC
  Filled 2020-09-30: qty 2

## 2020-09-30 MED ORDER — PHENYLEPHRINE 40 MCG/ML (10ML) SYRINGE FOR IV PUSH (FOR BLOOD PRESSURE SUPPORT)
PREFILLED_SYRINGE | INTRAVENOUS | Status: DC | PRN
  Administered 2020-09-30 (×3): 80 ug via INTRAVENOUS

## 2020-09-30 MED ORDER — TRANEXAMIC ACID-NACL 1000-0.7 MG/100ML-% IV SOLN
1000.0000 mg | Freq: Once | INTRAVENOUS | Status: AC
  Administered 2020-09-30: 1000 mg via INTRAVENOUS
  Filled 2020-09-30: qty 100

## 2020-09-30 MED ORDER — MIDAZOLAM HCL 5 MG/5ML IJ SOLN
INTRAMUSCULAR | Status: DC | PRN
  Administered 2020-09-30: 2 mg via INTRAVENOUS

## 2020-09-30 MED ORDER — ALBUMIN HUMAN 5 % IV SOLN
INTRAVENOUS | Status: AC
  Filled 2020-09-30: qty 250

## 2020-09-30 MED ORDER — PROPOFOL 10 MG/ML IV BOLUS
INTRAVENOUS | Status: DC | PRN
  Administered 2020-09-30: 20 mg via INTRAVENOUS

## 2020-09-30 MED ORDER — TRANEXAMIC ACID-NACL 1000-0.7 MG/100ML-% IV SOLN
1000.0000 mg | INTRAVENOUS | Status: AC
  Administered 2020-09-30: 1000 mg via INTRAVENOUS

## 2020-09-30 MED ORDER — 0.9 % SODIUM CHLORIDE (POUR BTL) OPTIME
TOPICAL | Status: DC | PRN
  Administered 2020-09-30: 1000 mL

## 2020-09-30 MED ORDER — FENTANYL CITRATE (PF) 100 MCG/2ML IJ SOLN
INTRAMUSCULAR | Status: AC
  Administered 2020-09-30: 50 ug via INTRAVENOUS
  Filled 2020-09-30: qty 2

## 2020-09-30 MED ORDER — METHOCARBAMOL 500 MG PO TABS
500.0000 mg | ORAL_TABLET | Freq: Four times a day (QID) | ORAL | Status: DC | PRN
  Administered 2020-09-30 – 2020-10-01 (×3): 500 mg via ORAL
  Filled 2020-09-30 (×3): qty 1

## 2020-09-30 MED ORDER — FENTANYL CITRATE (PF) 100 MCG/2ML IJ SOLN
INTRAMUSCULAR | Status: AC
  Filled 2020-09-30: qty 2

## 2020-09-30 MED ORDER — DEXAMETHASONE SODIUM PHOSPHATE 10 MG/ML IJ SOLN
10.0000 mg | Freq: Once | INTRAMUSCULAR | Status: AC
  Administered 2020-09-30: 10 mg via INTRAVENOUS

## 2020-09-30 MED ORDER — ONDANSETRON HCL 4 MG PO TABS: 4.0000 mg | ORAL_TABLET | Freq: Four times a day (QID) | ORAL | Status: DC | PRN

## 2020-09-30 MED ORDER — ASPIRIN 81 MG PO CHEW
81.0000 mg | CHEWABLE_TABLET | Freq: Two times a day (BID) | ORAL | Status: DC
  Administered 2020-09-30 – 2020-10-01 (×2): 81 mg via ORAL
  Filled 2020-09-30 (×2): qty 1

## 2020-09-30 MED ORDER — ONDANSETRON HCL 4 MG/2ML IJ SOLN
INTRAMUSCULAR | Status: AC
  Filled 2020-09-30: qty 2

## 2020-09-30 MED ORDER — FENTANYL CITRATE (PF) 100 MCG/2ML IJ SOLN
25.0000 ug | INTRAMUSCULAR | Status: DC | PRN
  Administered 2020-09-30 (×3): 50 ug via INTRAVENOUS

## 2020-09-30 MED ORDER — HYDROCODONE-ACETAMINOPHEN 5-325 MG PO TABS: 1.0000 | ORAL_TABLET | ORAL | Status: DC | PRN

## 2020-09-30 MED ORDER — DEXAMETHASONE SODIUM PHOSPHATE 10 MG/ML IJ SOLN
10.0000 mg | Freq: Once | INTRAMUSCULAR | Status: AC
  Administered 2020-10-01: 10 mg via INTRAVENOUS
  Filled 2020-09-30: qty 1

## 2020-09-30 MED ORDER — DOCUSATE SODIUM 100 MG PO CAPS
100.0000 mg | ORAL_CAPSULE | Freq: Two times a day (BID) | ORAL | Status: DC
  Administered 2020-09-30 – 2020-10-01 (×2): 100 mg via ORAL
  Filled 2020-09-30 (×2): qty 1

## 2020-09-30 MED ORDER — DOCUSATE SODIUM 100 MG PO CAPS: 100.0000 mg | ORAL_CAPSULE | Freq: Two times a day (BID) | ORAL | 0 refills | Status: DC

## 2020-09-30 MED ORDER — LACTATED RINGERS IV SOLN: INTRAVENOUS | Status: DC

## 2020-09-30 MED ORDER — FENTANYL CITRATE (PF) 100 MCG/2ML IJ SOLN
INTRAMUSCULAR | Status: DC | PRN
  Administered 2020-09-30 (×2): 50 ug via INTRAVENOUS

## 2020-09-30 MED ORDER — ONDANSETRON HCL 4 MG/2ML IJ SOLN
INTRAMUSCULAR | Status: DC | PRN
  Administered 2020-09-30: 4 mg via INTRAVENOUS

## 2020-09-30 MED ORDER — METOCLOPRAMIDE HCL 5 MG PO TABS: 5.0000 mg | ORAL_TABLET | Freq: Three times a day (TID) | ORAL | Status: DC | PRN

## 2020-09-30 MED ORDER — FERROUS SULFATE 325 (65 FE) MG PO TABS: 325.0000 mg | ORAL_TABLET | Freq: Three times a day (TID) | ORAL | 0 refills | Status: DC

## 2020-09-30 MED ORDER — METHOCARBAMOL 500 MG IVPB - SIMPLE MED
INTRAVENOUS | Status: AC
  Filled 2020-09-30: qty 50

## 2020-09-30 MED ORDER — CEFAZOLIN SODIUM-DEXTROSE 2-4 GM/100ML-% IV SOLN
2.0000 g | Freq: Four times a day (QID) | INTRAVENOUS | Status: AC
  Administered 2020-09-30 (×2): 2 g via INTRAVENOUS
  Filled 2020-09-30 (×2): qty 100

## 2020-09-30 MED ORDER — POLYETHYLENE GLYCOL 3350 17 G PO PACK: 17.0000 g | PACK | Freq: Two times a day (BID) | ORAL | 0 refills | Status: DC

## 2020-09-30 MED ORDER — PHENYLEPHRINE 40 MCG/ML (10ML) SYRINGE FOR IV PUSH (FOR BLOOD PRESSURE SUPPORT)
PREFILLED_SYRINGE | INTRAVENOUS | Status: AC
  Filled 2020-09-30: qty 10

## 2020-09-30 MED ORDER — SODIUM CHLORIDE 0.9 % IV SOLN: INTRAVENOUS | Status: DC

## 2020-09-30 MED ORDER — METOCLOPRAMIDE HCL 5 MG/ML IJ SOLN: 5.0000 mg | Freq: Three times a day (TID) | INTRAMUSCULAR | Status: DC | PRN

## 2020-09-30 MED ORDER — BUPIVACAINE IN DEXTROSE 0.75-8.25 % IT SOLN
INTRATHECAL | Status: DC | PRN
  Administered 2020-09-30: 1.8 mL via INTRATHECAL

## 2020-09-30 MED ORDER — OXYCODONE HCL 5 MG/5ML PO SOLN: 5.0000 mg | Freq: Once | ORAL | Status: AC | PRN

## 2020-09-30 MED ORDER — HYDROMORPHONE HCL 1 MG/ML IJ SOLN: 0.5000 mg | INTRAMUSCULAR | Status: DC | PRN

## 2020-09-30 MED ORDER — ALBUMIN HUMAN 5 % IV SOLN: INTRAVENOUS | Status: DC | PRN

## 2020-09-30 MED ORDER — POLYETHYLENE GLYCOL 3350 17 G PO PACK
17.0000 g | PACK | Freq: Two times a day (BID) | ORAL | Status: DC
  Administered 2020-09-30 – 2020-10-01 (×2): 17 g via ORAL
  Filled 2020-09-30 (×2): qty 1

## 2020-09-30 MED ORDER — PANTOPRAZOLE SODIUM 40 MG PO TBEC
40.0000 mg | DELAYED_RELEASE_TABLET | Freq: Every day | ORAL | Status: DC
  Administered 2020-10-01: 40 mg via ORAL
  Filled 2020-09-30: qty 1

## 2020-09-30 MED ORDER — ORAL CARE MOUTH RINSE: 15.0000 mL | Freq: Once | OROMUCOSAL | Status: AC

## 2020-09-30 MED ORDER — PROPOFOL 500 MG/50ML IV EMUL
INTRAVENOUS | Status: DC | PRN
  Administered 2020-09-30: 75 ug/kg/min via INTRAVENOUS

## 2020-09-30 MED ORDER — MAGNESIUM CITRATE PO SOLN: 1.0000 | Freq: Once | ORAL | Status: DC | PRN

## 2020-09-30 MED ORDER — ACETAMINOPHEN 325 MG PO TABS: 325.0000 mg | ORAL_TABLET | Freq: Four times a day (QID) | ORAL | Status: DC | PRN

## 2020-09-30 MED ORDER — MENTHOL 3 MG MT LOZG: 1.0000 | LOZENGE | OROMUCOSAL | Status: DC | PRN

## 2020-09-30 MED ORDER — CELECOXIB 200 MG PO CAPS
200.0000 mg | ORAL_CAPSULE | Freq: Two times a day (BID) | ORAL | Status: DC
  Administered 2020-09-30 – 2020-10-01 (×2): 200 mg via ORAL
  Filled 2020-09-30 (×2): qty 1

## 2020-09-30 MED ORDER — DIPHENHYDRAMINE HCL 12.5 MG/5ML PO ELIX: 12.5000 mg | ORAL_SOLUTION | ORAL | Status: DC | PRN

## 2020-09-30 MED ORDER — CEFAZOLIN SODIUM-DEXTROSE 2-4 GM/100ML-% IV SOLN
2.0000 g | INTRAVENOUS | Status: AC
  Administered 2020-09-30: 2 g via INTRAVENOUS

## 2020-09-30 MED ORDER — FINASTERIDE 5 MG PO TABS
5.0000 mg | ORAL_TABLET | Freq: Every day | ORAL | Status: DC
  Administered 2020-09-30 – 2020-10-01 (×2): 5 mg via ORAL
  Filled 2020-09-30 (×2): qty 1

## 2020-09-30 MED ORDER — HYDROCODONE-ACETAMINOPHEN 7.5-325 MG PO TABS
1.0000 | ORAL_TABLET | ORAL | Status: DC | PRN
  Administered 2020-09-30 – 2020-10-01 (×5): 1 via ORAL
  Filled 2020-09-30: qty 2
  Filled 2020-09-30 (×4): qty 1

## 2020-09-30 MED ORDER — DEXAMETHASONE SODIUM PHOSPHATE 10 MG/ML IJ SOLN
INTRAMUSCULAR | Status: AC
  Filled 2020-09-30: qty 1

## 2020-09-30 MED ORDER — HYDROCODONE-ACETAMINOPHEN 7.5-325 MG PO TABS
1.0000 | ORAL_TABLET | ORAL | 0 refills | Status: DC | PRN
Start: 2020-09-30 — End: 2021-04-27

## 2020-09-30 MED ORDER — BISACODYL 10 MG RE SUPP: 10.0000 mg | Freq: Every day | RECTAL | Status: DC | PRN

## 2020-09-30 MED ORDER — ALUM & MAG HYDROXIDE-SIMETH 200-200-20 MG/5ML PO SUSP: 15.0000 mL | ORAL | Status: DC | PRN

## 2020-09-30 SURGICAL SUPPLY — 45 items
BAG DECANTER FOR FLEXI CONT (MISCELLANEOUS) IMPLANT
BAG ZIPLOCK 12X15 (MISCELLANEOUS) IMPLANT
BLADE SAG 18X100X1.27 (BLADE) ×2 IMPLANT
BLADE SURG SZ10 CARB STEEL (BLADE) ×4 IMPLANT
COVER PERINEAL POST (MISCELLANEOUS) ×2 IMPLANT
COVER SURGICAL LIGHT HANDLE (MISCELLANEOUS) ×2 IMPLANT
COVER WAND RF STERILE (DRAPES) IMPLANT
CUP ACETBLR 52 OD PINNACLE (Hips) ×2 IMPLANT
DERMABOND ADVANCED (GAUZE/BANDAGES/DRESSINGS) ×1
DERMABOND ADVANCED .7 DNX12 (GAUZE/BANDAGES/DRESSINGS) ×1 IMPLANT
DRAPE STERI IOBAN 125X83 (DRAPES) ×2 IMPLANT
DRAPE U-SHAPE 47X51 STRL (DRAPES) ×4 IMPLANT
DRESSING AQUACEL AG SP 3.5X10 (GAUZE/BANDAGES/DRESSINGS) ×1 IMPLANT
DRSG AQUACEL AG SP 3.5X10 (GAUZE/BANDAGES/DRESSINGS) ×2
DURAPREP 26ML APPLICATOR (WOUND CARE) ×2 IMPLANT
ELECT REM PT RETURN 15FT ADLT (MISCELLANEOUS) ×2 IMPLANT
ELIMINATOR HOLE APEX DEPUY (Hips) ×2 IMPLANT
GLOVE BIO SURGEON STRL SZ 6 (GLOVE) ×4 IMPLANT
GLOVE BIOGEL PI IND STRL 6.5 (GLOVE) ×1 IMPLANT
GLOVE BIOGEL PI IND STRL 7.5 (GLOVE) ×1 IMPLANT
GLOVE BIOGEL PI IND STRL 8.5 (GLOVE) ×1 IMPLANT
GLOVE BIOGEL PI INDICATOR 6.5 (GLOVE) ×1
GLOVE BIOGEL PI INDICATOR 7.5 (GLOVE) ×1
GLOVE BIOGEL PI INDICATOR 8.5 (GLOVE) ×1
GLOVE ECLIPSE 8.0 STRL XLNG CF (GLOVE) ×4 IMPLANT
GLOVE ORTHO TXT STRL SZ7.5 (GLOVE) ×4 IMPLANT
GOWN STRL REUS W/TWL LRG LVL3 (GOWN DISPOSABLE) ×4 IMPLANT
GOWN STRL REUS W/TWL XL LVL3 (GOWN DISPOSABLE) ×2 IMPLANT
HEAD CERAMIC DELTA 36 PLUS 1.5 (Hips) ×2 IMPLANT
HOLDER FOLEY CATH W/STRAP (MISCELLANEOUS) ×2 IMPLANT
KIT TURNOVER KIT A (KITS) IMPLANT
LINER NEUTRAL 52X36MM PLUS 4 (Liner) ×2 IMPLANT
PACK ANTERIOR HIP CUSTOM (KITS) ×2 IMPLANT
PENCIL SMOKE EVACUATOR (MISCELLANEOUS) IMPLANT
SCREW 6.5MMX30MM (Screw) ×2 IMPLANT
STEM FEMORAL SZ5 HIGH ACTIS (Stem) ×2 IMPLANT
SUT MNCRL AB 4-0 PS2 18 (SUTURE) ×2 IMPLANT
SUT STRATAFIX 0 PDS 27 VIOLET (SUTURE) ×2
SUT VIC AB 1 CT1 36 (SUTURE) ×6 IMPLANT
SUT VIC AB 2-0 CT1 27 (SUTURE) ×2
SUT VIC AB 2-0 CT1 TAPERPNT 27 (SUTURE) ×2 IMPLANT
SUTURE STRATFX 0 PDS 27 VIOLET (SUTURE) ×1 IMPLANT
TRAY FOLEY MTR SLVR 16FR STAT (SET/KITS/TRAYS/PACK) IMPLANT
WATER STERILE IRR 1000ML POUR (IV SOLUTION) ×2 IMPLANT
YANKAUER SUCT BULB TIP NO VENT (SUCTIONS) ×4 IMPLANT

## 2020-09-30 NOTE — Anesthesia Procedure Notes (Signed)
Spinal  Patient location during procedure: OR Start time: 09/30/2020 9:43 AM End time: 09/30/2020 9:47 AM Staffing Performed: resident/CRNA  Anesthesiologist: Albertha Ghee, MD Resident/CRNA: Talbot Grumbling, CRNA Preanesthetic Checklist Completed: patient identified, IV checked, risks and benefits discussed, surgical consent, monitors and equipment checked, pre-op evaluation and timeout performed Spinal Block Patient position: sitting Prep: DuraPrep Patient monitoring: heart rate, cardiac monitor, continuous pulse ox and blood pressure Approach: midline Location: L3-4 Injection technique: single-shot Needle Needle type: Sprotte and Pencan  Needle gauge: 24 G Needle length: 9 cm Assessment Sensory level: T4 Additional Notes Clear CSF, no paresthesia, patient tolerated well.

## 2020-09-30 NOTE — Anesthesia Preprocedure Evaluation (Signed)
Anesthesia Evaluation  Patient identified by MRN, date of birth, ID band Patient awake    Reviewed: Allergy & Precautions, H&P , NPO status , Patient's Chart, lab work & pertinent test results  Airway Mallampati: II   Neck ROM: full    Dental   Pulmonary neg pulmonary ROS,    breath sounds clear to auscultation       Cardiovascular hypertension,  Rhythm:regular Rate:Normal     Neuro/Psych    GI/Hepatic GERD  ,  Endo/Other    Renal/GU      Musculoskeletal  (+) Arthritis ,   Abdominal   Peds  Hematology   Anesthesia Other Findings   Reproductive/Obstetrics                             Anesthesia Physical Anesthesia Plan  ASA: II  Anesthesia Plan: MAC and Spinal   Post-op Pain Management:    Induction: Intravenous  PONV Risk Score and Plan: 1 and Propofol infusion, Ondansetron and Treatment may vary due to age or medical condition  Airway Management Planned: Simple Face Mask  Additional Equipment:   Intra-op Plan:   Post-operative Plan:   Informed Consent: I have reviewed the patients History and Physical, chart, labs and discussed the procedure including the risks, benefits and alternatives for the proposed anesthesia with the patient or authorized representative who has indicated his/her understanding and acceptance.       Plan Discussed with: CRNA, Anesthesiologist and Surgeon  Anesthesia Plan Comments:         Anesthesia Quick Evaluation

## 2020-09-30 NOTE — Anesthesia Postprocedure Evaluation (Signed)
Anesthesia Post Note  Patient: Christian Dodson  Procedure(s) Performed: TOTAL HIP ARTHROPLASTY ANTERIOR APPROACH (Left Hip)     Patient location during evaluation: PACU Anesthesia Type: MAC and Spinal Level of consciousness: oriented and awake and alert Pain management: pain level controlled Vital Signs Assessment: post-procedure vital signs reviewed and stable Respiratory status: spontaneous breathing, respiratory function stable and patient connected to nasal cannula oxygen Cardiovascular status: blood pressure returned to baseline and stable Postop Assessment: no headache, no backache and no apparent nausea or vomiting Anesthetic complications: no   No complications documented.  Last Vitals:  Vitals:   09/30/20 1200 09/30/20 1215  BP: 130/63 130/81  Pulse: 80 79  Resp: 13 15  Temp:    SpO2: 99% 99%    Last Pain:  Vitals:   09/30/20 1215  TempSrc:   PainSc: 2                  Leshon Armistead S

## 2020-09-30 NOTE — Transfer of Care (Signed)
Immediate Anesthesia Transfer of Care Note  Patient: Christian Dodson  Procedure(s) Performed: TOTAL HIP ARTHROPLASTY ANTERIOR APPROACH (Left Hip)  Patient Location: PACU  Anesthesia Type:Spinal  Level of Consciousness: awake, alert  and oriented  Airway & Oxygen Therapy: Patient Spontanous Breathing and Patient connected to face mask oxygen  Post-op Assessment: Report given to RN and Post -op Vital signs reviewed and stable  Post vital signs: Reviewed and stable  Last Vitals:  Vitals Value Taken Time  BP 116/74 09/30/20 1130  Temp 36.8 C 09/30/20 1130  Pulse 89 09/30/20 1131  Resp 16 09/30/20 1131  SpO2 98 % 09/30/20 1131  Vitals shown include unvalidated device data.  Last Pain:  Vitals:   09/30/20 0921  TempSrc: Oral  PainSc:          Complications: No complications documented.

## 2020-09-30 NOTE — Anesthesia Procedure Notes (Signed)
Date/Time: 09/30/2020 9:49 AM Performed by: Talbot Grumbling, CRNA Oxygen Delivery Method: Simple face mask

## 2020-09-30 NOTE — Evaluation (Signed)
Physical Therapy Evaluation Patient Details Name: Christian Dodson MRN: [id number] DOB: [birth year] Today's Date: 09/30/2020   History of Present Illness  Patient is 74 y.o. male s/p Lt THA anterior approach on 09/30/20 with PMH significant for HTN, GERD, BPH, OA, Rt UKA in 2013.    Clinical Impression  Devonte Migues is a 74 y.o. male POD 0 s/p Lt THA. Patient reports independence with mobility at baseline. Patient is now limited by functional impairments (see PT problem list below). Patient limited today by orthostatic hypotension. He required Min assist for bed mobility and was unable to progress to functional transfers and gait. BP assessed after supine to sit and dropped from 140's/80's to 93/60. After ~5 minutes in supine pt BP recovered to 140/88. Patient instructed in exercise to facilitate ROM and circulation. Patient will benefit from continued skilled PT interventions to address impairments and progress towards PLOF. Acute PT will follow to progress mobility and stair training in preparation for safe discharge home.     Follow Up Recommendations Follow surgeon's recommendation for DC plan and follow-up therapies    Equipment Recommendations  3in1 (PT)    Recommendations for Other Services       Precautions / Restrictions Precautions Precautions: Fall Restrictions Weight Bearing Restrictions: No Other Position/Activity Restrictions: WBAT      Mobility  Bed Mobility Overal bed mobility: Needs Assistance Bed Mobility: Sit to Supine;Supine to Sit     Supine to sit: Min assist;HOB elevated Sit to supine: Min assist;HOB elevated   General bed mobility comments: cues for use of bed rail and light assist to bring Lt LE off EOB. Pt returned to supine due to c/o dizziness, and symptoms of orthostatic hypotension. pt required min assist to sequencing lowering trunk and raising LE's onto bed.  Transfers                 General transfer comment: NT due to orthostatic  hypotension  Ambulation/Gait                Stairs            Wheelchair Mobility    Modified Rankin (Stroke Patients Only)       Balance Overall balance assessment: Needs assistance Sitting-balance support: Feet supported Sitting balance-Leahy Scale: Fair                                       Pertinent Vitals/Pain Pain Assessment: Faces Faces Pain Scale: Hurts a little bit Pain Location: Lt hip Pain Descriptors / Indicators: Aching;Discomfort Pain Intervention(s): Limited activity within patient's tolerance;Repositioned;Monitored during session;Ice applied    Home Living Family/patient expects to be discharged to:: Private residence Living Arrangements: Spouse/significant other Available Help at Discharge: Family Type of Home: House Home Access: Stairs to enter Entrance Stairs-Rails: None Entrance Stairs-Number of Steps: 1 Home Layout: One level Home Equipment: Environmental consultant - 2 wheels;Cane - single point      Prior Function Level of Independence: Independent               Hand Dominance   Dominant Hand: Right    Extremity/Trunk Assessment   Upper Extremity Assessment Upper Extremity Assessment: Overall WFL for tasks assessed    Lower Extremity Assessment Lower Extremity Assessment: Overall WFL for tasks assessed (limited assessement due to hypotension)    Cervical / Trunk Assessment Cervical / Trunk Assessment: Normal  Communication   Communication: No difficulties  Cognition Arousal/Alertness: Awake/alert Behavior During Therapy: WFL for tasks assessed/performed Overall Cognitive Status: Within Functional Limits for tasks assessed                                        General Comments General comments (skin integrity, edema, etc.): pt with symptomatic orthostatic hypotension sitting EOB. Pt diaphoretic, dizzy, pale. BP assessed after return to supine and noted to be 93/60. (pt had been running in 140's  today)    Exercises Total Joint Exercises Ankle Circles/Pumps: AROM;Both;20 reps;Supine Quad Sets: AROM;Left;10 reps;Supine   Assessment/Plan    PT Assessment Patient needs continued PT services  PT Problem List Decreased range of motion;Decreased strength;Decreased activity tolerance;Decreased balance;Decreased mobility;Decreased knowledge of use of DME;Decreased knowledge of precautions;Pain       PT Treatment Interventions DME instruction;Gait training;Stair training;Functional mobility training;Therapeutic activities;Therapeutic exercise;Balance training;Patient/family education    PT Goals (Current goals can be found in the Care Plan section)  Acute Rehab PT Goals Patient Stated Goal: get back to golfing PT Goal Formulation: With patient Time For Goal Achievement: 10/07/20 Potential to Achieve Goals: Good    Frequency 7X/week   Barriers to discharge        Co-evaluation               AM-PAC PT "6 Clicks" Mobility  Outcome Measure Help needed turning from your back to your side while in a flat bed without using bedrails?: A Little Help needed moving from lying on your back to sitting on the side of a flat bed without using bedrails?: A Little Help needed moving to and from a bed to a chair (including a wheelchair)?: A Lot Help needed standing up from a chair using your arms (e.g., wheelchair or bedside chair)?: A Lot Help needed to walk in hospital room?: Total Help needed climbing 3-5 steps with a railing? : Total 6 Click Score: 12    End of Session   Activity Tolerance: Treatment limited secondary to medical complications (Comment) (orthostatic hypotension) Patient left: in bed;with call bell/phone within reach;with nursing/sitter in room;with family/visitor present;with SCD's reapplied Nurse Communication: Mobility status PT Visit Diagnosis: Other abnormalities of gait and mobility (R26.89);Muscle weakness (generalized) (M62.81);Difficulty in walking, not  elsewhere classified (R26.2)    Time: 7544-9201 PT Time Calculation (min) (ACUTE ONLY): 23 min   Charges:   PT Evaluation $PT Eval Low Complexity: 1 Low PT Treatments $Therapeutic Exercise: 8-22 mins        Verner Mould, DPT Acute Rehabilitation Services  Office [phone] Pager [phone]  09/30/2020 5:11 PM

## 2020-09-30 NOTE — Op Note (Signed)
NAME:  Christian Dodson                ACCOUNT NO.: [id number]      MEDICAL RECORD NO.: [id number]      FACILITY:  [facility]      PHYSICIAN:  Mauri Pole  DATE OF BIRTH:  [birth year]     DATE OF PROCEDURE:  09/30/2020                                 OPERATIVE REPORT         PREOPERATIVE DIAGNOSIS: Left  hip osteoarthritis.      POSTOPERATIVE DIAGNOSIS:  Left hip osteoarthritis.      PROCEDURE:  Left total hip replacement through an anterior approach   utilizing DePuy THR system, component size 52 mm pinnacle cup, a size 36+4 neutral   Altrex liner, a size 5 Hi Actis stem with a 36+1.5 delta ceramic   ball.      SURGEON:  Pietro Cassis. Alvan Dame, M.D.      ASSISTANT:  Griffith Citron, PA-C     ANESTHESIA:  Spinal.      SPECIMENS:  None.      COMPLICATIONS:  None.      BLOOD LOSS:  700 cc     DRAINS:  None.      INDICATION OF THE PROCEDURE:  Christian Dodson is a 74 y.o. male who had   presented to office for evaluation of left hip pain.  Radiographs revealed   progressive degenerative changes with bone-on-bone   articulation of the  hip joint, including subchondral cystic changes and osteophytes.  The patient had painful limited range of   motion significantly affecting their overall quality of life and function.  The patient was failing to    respond to conservative measures including medications and/or injections and activity modification and at this point was ready   to proceed with more definitive measures.  Consent was obtained for   benefit of pain relief.  Specific risks of infection, DVT, component   failure, dislocation, neurovascular injury, and need for revision surgery were reviewed in the office as well discussion of   the anterior versus posterior approach were reviewed.     PROCEDURE IN DETAIL:  The patient was brought to operative theater.   Once adequate anesthesia, preoperative antibiotics, 2 gm of Ancef, 1 gm of Tranexamic Acid, and 10 mg of  Decadron were administered, the patient was positioned supine on the [employer] table.  Once the patient was safely positioned with adequate padding of boney prominences we predraped out the hip, and used fluoroscopy to confirm orientation of the pelvis.      The left hip was then prepped and draped from proximal iliac crest to   mid thigh with a shower curtain technique.      Time-out was performed identifying the patient, planned procedure, and the appropriate extremity.     An incision was then made 2 cm lateral to the   anterior superior iliac spine extending over the orientation of the   tensor fascia lata muscle and sharp dissection was carried down to the   fascia of the muscle.      The fascia was then incised.  The muscle belly was identified and swept   laterally and retractor placed along the superior neck.  Following   cauterization of the circumflex vessels and removing some pericapsular  fat, a second cobra retractor was placed on the inferior neck.  A T-capsulotomy was made along the line of the   superior neck to the trochanteric fossa, then extended proximally and   distally.  Tag sutures were placed and the retractors were then placed   intracapsular.  We then identified the trochanteric fossa and   orientation of my neck cut and then made a neck osteotomy with the femur on traction.  The femoral   head was removed without difficulty or complication.  Traction was let   off and retractors were placed posterior and anterior around the   acetabulum.      The labrum and foveal tissue were debrided.  I began reaming with a 46 mm   reamer and reamed up to 51 mm reamer with good bony bed preparation and a 52 mm  cup was chosen.  The final 52 mm Pinnacle cup was then impacted under fluoroscopy to confirm the depth of penetration and orientation with respect to   Abduction and forward flexion.  A screw was placed into the ilium followed by the hole eliminator.  The final   36+4  neutral Altrex liner was impacted with good visualized rim fit.  The cup was positioned anatomically within the acetabular portion of the pelvis.      At this point, the femur was rolled to 100 degrees.  Further capsule was   released off the inferior aspect of the femoral neck.  I then   released the superior capsule proximally.  With the leg in a neutral position the hook was placed laterally   along the femur under the vastus lateralis origin and elevated manually and then held in position using the hook attachment on the bed.  The leg was then extended and adducted with the leg rolled to 100   degrees of external rotation.  Retractors were placed along the medial calcar and posteriorly over the greater trochanter.  Once the proximal femur was fully   exposed, I used a box osteotome to set orientation.  I then began   broaching with the starting chili pepper broach and passed this by hand and then broached up to 5.  With the 5 broach in place I chose a high offset neck and did several trial reductions.  The offset was appropriate, leg lengths   appeared to be equal best matched with the +1.5 head ball trial confirmed radiographically.   Given these findings, I went ahead and dislocated the hip, repositioned all   retractors and positioned the right hip in the extended and abducted position.  The final 5 Hi Actis stem was   chosen and it was impacted down to the level of neck cut.  Based on this   and the trial reductions, a final 36+1.5 delta ceramic ball was chosen and   impacted onto a clean and dry trunnion, and the hip was reduced.  The   hip had been irrigated throughout the case again at this point.  I did   reapproximate the superior capsular leaflet to the anterior leaflet   using #1 Vicryl.  The fascia of the   tensor fascia lata muscle was then reapproximated using #1 Vicryl and #0 Stratafix sutures.  The   remaining wound was closed with 2-0 Vicryl and running 4-0 Monocryl.   The  hip was cleaned, dried, and dressed sterilely using Dermabond and   Aquacel dressing.  The patient was then brought   to recovery room in  stable condition tolerating the procedure well.    Griffith Citron, PA-C was present for the entirety of the case involved from   preoperative positioning, perioperative retractor management, general   facilitation of the case, as well as primary wound closure as assistant.            Pietro Cassis Alvan Dame, M.D.        09/30/2020 9:53 AM

## 2020-09-30 NOTE — Progress Notes (Signed)
Orthopedic Tech Progress Note Patient Details:  Christian Dodson Apr 20, 1946 [id number]  Ortho Devices Ortho Device/Splint Location: applied overhead frame Ortho Device/Splint Interventions: Ordered, Application   Post Interventions Patient Tolerated: Well Instructions Provided: Care of device   Braulio Bosch 09/30/2020, 5:46 PM

## 2020-09-30 NOTE — Interval H&P Note (Signed)
History and Physical Interval Note:  09/30/2020 8:52 AM  Christian Dodson  has presented today for surgery, with the diagnosis of Left hip osteoarthritis.  The various methods of treatment have been discussed with the patient and family. After consideration of risks, benefits and other options for treatment, the patient has consented to  Procedure(s) with comments: TOTAL HIP ARTHROPLASTY ANTERIOR APPROACH (Left) - 70 mins as a surgical intervention.  The patient's history has been reviewed, patient examined, no change in status, stable for surgery.  I have reviewed the patient's chart and labs.  Questions were answered to the patient's satisfaction.     Mauri Pole

## 2020-09-30 NOTE — Discharge Instructions (Signed)

## 2020-10-01 ENCOUNTER — Encounter (HOSPITAL_COMMUNITY): Payer: Self-pay | Admitting: Orthopedic Surgery

## 2020-10-01 DIAGNOSIS — M1612 Unilateral primary osteoarthritis, left hip: Secondary | ICD-10-CM | POA: Diagnosis not present

## 2020-10-01 DIAGNOSIS — E663 Overweight: Secondary | ICD-10-CM | POA: Insufficient documentation

## 2020-10-01 DIAGNOSIS — Z6825 Body mass index (BMI) 25.0-25.9, adult: Secondary | ICD-10-CM | POA: Insufficient documentation

## 2020-10-01 LAB — CBC
HCT: 26.5 % — ABNORMAL LOW (ref 39.0–52.0)
Hemoglobin: 9.7 g/dL — ABNORMAL LOW (ref 13.0–17.0)
MCH: 32.8 pg (ref 26.0–34.0)
MCHC: 36.6 g/dL — ABNORMAL HIGH (ref 30.0–36.0)
MCV: 89.5 fL (ref 80.0–100.0)
Platelets: 159 10*3/uL (ref 150–400)
RBC: 2.96 MIL/uL — ABNORMAL LOW (ref 4.22–5.81)
RDW: 12.4 % (ref 11.5–15.5)
WBC: 12.6 10*3/uL — ABNORMAL HIGH (ref 4.0–10.5)
nRBC: 0 % (ref 0.0–0.2)

## 2020-10-01 LAB — BASIC METABOLIC PANEL
Anion gap: 10 (ref 5–15)
BUN: 20 mg/dL (ref 8–23)
CO2: 22 mmol/L (ref 22–32)
Calcium: 8.2 mg/dL — ABNORMAL LOW (ref 8.9–10.3)
Chloride: 102 mmol/L (ref 98–111)
Creatinine, Ser: 1.07 mg/dL (ref 0.61–1.24)
GFR, Estimated: >60 mL/min (ref >60)
Glucose, Bld: 172 mg/dL — ABNORMAL HIGH (ref 70–99)
Potassium: 3.5 mmol/L (ref 3.5–5.1)
Sodium: 134 mmol/L — ABNORMAL LOW (ref 135–145)

## 2020-10-01 NOTE — Progress Notes (Signed)
     Subjective: 1 Day Post-Op Procedure(s) (LRB): TOTAL HIP ARTHROPLASTY ANTERIOR APPROACH (Left)   Patient reports pain as mild, pain controlled. Felt a little dizzy yesterday when getting up with PT, but feeling better when getting up today.  Discussed the procedure, findings and expectations moving forward.  Ready to be discharged home, if they do well with PT.  Follow up in the clinic in 2 weeks.  Knows to call with any questions or concerns.       Objective:   VITALS:   Vitals:   10/01/20 0229 10/01/20 0500  BP: 116/77 139/76  Pulse: 91 98  Resp: 17 16  Temp: 97.8 F (36.6 C) 98.3 F (36.8 C)  SpO2: 99% 98%    Dorsiflexion/Plantar flexion intact Incision: dressing C/D/I No cellulitis present Compartment soft  LABS Recent Labs    10/01/20 0324  HGB 9.7*  HCT 26.5*  WBC 12.6*  PLT 159    Recent Labs    10/01/20 0324  NA 134*  K 3.5  BUN 20  CREATININE 1.07  GLUCOSE 172*     Assessment/Plan: 1 Day Post-Op Procedure(s) (LRB): TOTAL HIP ARTHROPLASTY ANTERIOR APPROACH (Left) Foley cath d/c'ed Advance diet Up with therapy D/C IV fluids Discharge home Follow up in 2 weeks at [facility] Follow up with OLIN,Haila Dena D in 2 weeks.  Contact information:  [facility] [address], [address] [place] [address] [phone]    Overweight (BMI 25-29.9) Estimated body mass index is 29.54 kg/m as calculated from the following:   Height as of this encounter: 5\' 6"  (1.676 m).   Weight as of this encounter: 83 kg. Patient also counseled that weight may inhibit the healing process Patient counseled that losing weight will help with future health issues          Danae Orleans PA-C  [facility]  [place] [address], [address], [place], [place] [address] Phone: [phone] [email or web address] Facebook  [employer]

## 2020-10-01 NOTE — TOC Transition Note (Signed)
Transition of Care [facility]) - CM/SW Discharge Note   Patient Details  Name: Christian Dodson MRN: [id number] Date of Birth: 1946/09/07  Transition of Care [facility]) CM/SW Contact:  Lia Hopping, [place] Phone Number: 10/01/2020, 4:19 PM   Clinical Narrative:    Therapy Plan: HEP 3 IN1 delivered to the pt. Bedside by [employer]   Final next level of care:  (HEP) Barriers to Discharge: No Barriers Identified   Patient Goals and CMS Choice        Discharge Placement                       Discharge Plan and Services                DME Arranged: 3-N-1   Date DME Agency Contacted: 10/01/20 Time DME Agency Contacted: [phone] Representative spoke with at DME Agency: [place] Interventions     Readmission Risk Interventions No flowsheet data found.

## 2020-10-01 NOTE — Discharge Summary (Signed)
Patient ID: Christian Dodson MRN: [id number] DOB/AGE: [birth year] 74 y.o.  Admit date: 09/30/2020 Discharge date: 10/01/2020  Admission Diagnoses:  Principal Problem:   Osteoarthritis of left hip Active Problems:   S/P left THA, AA   Overweight (BMI 25.0-29.9)   Discharge Diagnoses:  Same  Past Medical History:  Diagnosis Date  . Arthritis   . BPH (benign prostatic hyperplasia) 10/19/2013  . GERD (gastroesophageal reflux disease)   . History of stomach ulcers    age 50  . Hypertension   . Pneumonia    x4  . Pre-diabetes   . Skin cancer     Surgeries: Procedure(s): LEFT TOTAL HIP ARTHROPLASTY ANTERIOR APPROACH on 09/30/2020   Consultants: N/A  Discharged Condition: Improved  Hospital Course: Daquan Crapps is an 74 y.o. male who was admitted 09/30/2020 for operative treatment ofOsteoarthritis of left hip. Patient has severe unremitting pain that affects sleep, daily activities, and work/hobbies. After pre-op clearance the patient was taken to the operating room on 09/30/2020 and underwent  Procedure(s): LEFT TOTAL HIP ARTHROPLASTY ANTERIOR APPROACH.    Patient was given perioperative antibiotics:  Anti-infectives (From admission, onward)   Start     Dose/Rate Route Frequency Ordered Stop   09/30/20 1730  ceFAZolin (ANCEF) IVPB 2g/100 mL premix        2 g 200 mL/hr over 30 Minutes Intravenous Every 6 hours 09/30/20 1630 09/30/20 2332   09/30/20 0900  ceFAZolin (ANCEF) IVPB 2g/100 mL premix        2 g 200 mL/hr over 30 Minutes Intravenous On call to O.R. 09/30/20 6948 09/30/20 0955       Patient was given sequential compression devices, early ambulation, and chemoprophylaxis to prevent DVT.  Patient benefited maximally from hospital stay and there were no complications.    Recent vital signs:  Patient Vitals for the past 24 hrs:  BP Temp Temp src Pulse Resp SpO2  10/01/20 0500 139/76 98.3 F (36.8 C) Oral 98 16 98 %  10/01/20 0229 116/77 97.8 F (36.6 C) Oral 91 17  99 %  10/01/20 0042 -- -- -- 99 -- 95 %  09/30/20 2230 110/80 98.4 F (36.9 C) Oral (!) 110 17 96 %  09/30/20 1821 130/86 98.3 F (36.8 C) -- (!) 110 14 97 %  09/30/20 1645 93/60 98.3 F (36.8 C) -- 94 16 98 %  09/30/20 1540 140/79 -- -- (!) 108 16 99 %  09/30/20 1445 (!) 144/89 -- -- 96 14 100 %  09/30/20 1430 (!) 148/105 -- -- 95 16 100 %  09/30/20 1415 136/82 -- -- 89 14 100 %  09/30/20 1400 136/86 -- -- 87 14 98 %  09/30/20 1345 130/84 -- -- 87 15 100 %  09/30/20 1330 133/89 -- -- 87 16 100 %  09/30/20 1315 125/77 -- -- 83 14 100 %  09/30/20 1300 133/76 -- -- 84 18 100 %  09/30/20 1245 130/75 -- -- 79 19 99 %  09/30/20 1230 134/84 -- -- 78 12 100 %  09/30/20 1215 130/81 -- -- 79 15 99 %  09/30/20 1200 130/63 -- -- 80 13 99 %  09/30/20 1145 122/70 -- -- 80 17 100 %  09/30/20 1130 116/74 98.3 F (36.8 C) -- 89 19 98 %     Recent laboratory studies:  Recent Labs    10/01/20 0324  WBC 12.6*  HGB 9.7*  HCT 26.5*  PLT 159  NA 134*  K 3.5  CL 102  CO2 22  BUN 20  CREATININE 1.07  GLUCOSE 172*  CALCIUM 8.2*     Discharge Medications:   Allergies as of 10/01/2020   No Known Allergies     Medication List    STOP taking these medications   acetaminophen 500 MG tablet Commonly known as: TYLENOL     TAKE these medications   aspirin 81 MG chewable tablet Commonly known as: Aspirin Childrens Chew 1 tablet (81 mg total) by mouth 2 (two) times daily. Take for 4 weeks, then resume regular dose.   cholecalciferol 25 MCG (1000 UNIT) tablet Commonly known as: VITAMIN D3 Take 1,000 Units by mouth daily.   docusate sodium 100 MG capsule Commonly known as: Colace Take 1 capsule (100 mg total) by mouth 2 (two) times daily.   ferrous sulfate 325 (65 FE) MG tablet Commonly known as: FerrouSul Take 1 tablet (325 mg total) by mouth 3 (three) times daily with meals for 14 days.   finasteride 5 MG tablet Commonly known as: PROSCAR TAKE 1 TABLET DAILY    GLUCOSAMINE-CHONDROITIN PO Take 1 tablet by mouth 2 (two) times daily.   hydrochlorothiazide 25 MG tablet Commonly known as: HYDRODIURIL TAKE 1 TABLET DAILY   HYDROcodone-acetaminophen 7.5-325 MG tablet Commonly known as: Norco Take 1-2 tablets by mouth every 4 (four) hours as needed for moderate pain.   lisinopril 40 MG tablet Commonly known as: ZESTRIL TAKE 1 TABLET DAILY   methocarbamol 500 MG tablet Commonly known as: Robaxin Take 1 tablet (500 mg total) by mouth every 6 (six) hours as needed for muscle spasms.   omeprazole 20 MG capsule Commonly known as: PRILOSEC Take 20 mg by mouth daily.   polyethylene glycol 17 g packet Commonly known as: MIRALAX / GLYCOLAX Take 17 g by mouth 2 (two) times daily.   zinc gluconate 50 MG tablet Take 50 mg by mouth daily.            Discharge Care Instructions  (From admission, onward)         Start     Ordered   10/01/20 0000  Change dressing       Comments: Maintain surgical dressing until follow up in the clinic. If the edges start to pull up, may reinforce with tape. If the dressing is no longer working, may remove and cover with gauze and tape, but must keep the area dry and clean.  Call with any questions or concerns.   10/01/20 0927          Diagnostic Studies: DG Pelvis Portable  Result Date: 09/30/2020 CLINICAL DATA:  Postop left total hip arthroplasty. EXAM: PORTABLE PELVIS 1-2 VIEWS COMPARISON:  Intraoperative views same date. FINDINGS: AP portable view of the lower pelvis, excluding the iliac crests. Status post left total hip arthroplasty with a screw fixed acetabular component. The hardware is well positioned. No evidence of acute fracture or dislocation. Moderate right hip degenerative changes are noted. There is gas within the soft tissue surrounding the left hip. IMPRESSION: No demonstrated complication following left total hip arthroplasty. Moderate right hip degenerative changes. Electronically Signed    By: Richardean Sale M.D.   On: 09/30/2020 12:30   DG C-Arm 1-60 Min-No Report  Result Date: 09/30/2020 CLINICAL DATA:  Left hip replacement EXAM: OPERATIVE left HIP (WITH PELVIS IF PERFORMED) 2 VIEWS TECHNIQUE: Fluoroscopic spot image(s) were submitted for interpretation post-operatively. COMPARISON:  None. FINDINGS: Left hip replacement in satisfactory position alignment. Femoral head prosthesis not yet in place on these images. IMPRESSION: Satisfactory left  hip replacement. Pending placement of left femoral head prosthesis. Electronically Signed   By: Franchot Gallo M.D.   On: 09/30/2020 11:58   DG HIP OPERATIVE UNILAT W OR W/O PELVIS LEFT  Result Date: 09/30/2020 CLINICAL DATA:  Left hip replacement EXAM: OPERATIVE left HIP (WITH PELVIS IF PERFORMED) 2 VIEWS TECHNIQUE: Fluoroscopic spot image(s) were submitted for interpretation post-operatively. COMPARISON:  None. FINDINGS: Left hip replacement in satisfactory position alignment. Femoral head prosthesis not yet in place on these images. IMPRESSION: Satisfactory left hip replacement. Pending placement of left femoral head prosthesis. Electronically Signed   By: Franchot Gallo M.D.   On: 09/30/2020 11:58    Disposition: Home  Discharge Instructions    Call MD / Call 911   Complete by: As directed    If you experience chest pain or shortness of breath, CALL 911 and be transported to the hospital emergency room.  If you develope a fever above 101 F, pus (white drainage) or increased drainage or redness at the wound, or calf pain, call your surgeon's office.   Change dressing   Complete by: As directed    Maintain surgical dressing until follow up in the clinic. If the edges start to pull up, may reinforce with tape. If the dressing is no longer working, may remove and cover with gauze and tape, but must keep the area dry and clean.  Call with any questions or concerns.   Constipation Prevention   Complete by: As directed    Drink plenty of  fluids.  Prune juice may be helpful.  You may use a stool softener, such as Colace (over the counter) 100 mg twice a day.  Use MiraLax (over the counter) for constipation as needed.   Diet - low sodium heart healthy   Complete by: As directed    Discharge instructions   Complete by: As directed    Maintain surgical dressing until follow up in the clinic. If the edges start to pull up, may reinforce with tape. If the dressing is no longer working, may remove and cover with gauze and tape, but must keep the area dry and clean.  Follow up in 2 weeks at [facility]. Call with any questions or concerns.   Increase activity slowly as tolerated   Complete by: As directed    Weight bearing as tolerated with assist device (walker, cane, etc) as directed, use it as long as suggested by your surgeon or therapist, typically at least 4-6 weeks.   TED hose   Complete by: As directed    Use stockings (TED hose) for 2 weeks on both leg(s).  You may remove them at night for sleeping.       Follow-up Information    Paralee Cancel, MD. Schedule an appointment as soon as possible for a visit in 2 weeks.   Specialty: Orthopedic Surgery Contact information: [address] [place] [address] [phone]                Signed: Lucille Passy [facility] 10/01/2020, 9:28 AM

## 2020-10-01 NOTE — Progress Notes (Signed)
Discharge instructions reviewed with patient utilizing teach back method, no questions at this time. Patient being discharged to home DME given to patient.

## 2020-10-01 NOTE — Progress Notes (Signed)
Physical Therapy Treatment Patient Details Name: Christian Dodson MRN: [id number] DOB: 1946-05-21 Today's Date: 10/01/2020    History of Present Illness Patient is 74 y.o. male s/p Lt THA anterior approach on 09/30/20 with PMH significant for HTN, GERD, BPH, OA, Rt UKA in 2013.    PT Comments    POD # 1 am session Pt already OOB in recliner.  No c/o dizziness.  BP in recliner 153/93 HR 111 RA 99%.  Standing BP 135/82 HR 124 RA 98%.  After amb 20 feet BP 128/83 HR 126 RA 99%.  No c/o of dizziness.   General Gait Details: tolerated amb in hallway with 25% VC's on proper sequencing and proper walker to self distance. Then returned to room to perform some TE's following HEP handout.  Instructed on proper tech, freq as well as use of ICE.   Pt will need another PT session to addrwess stairs and complete HEP education.   Follow Up Recommendations  Follow surgeons recommendation for DC plan and follow-up therapies     Equipment Recommendations  3in1 (PT)    Recommendations for Other Services       Precautions / Restrictions Precautions Precautions: Fall Restrictions LLE Weight Bearing: Weight bearing as tolerated    Mobility  Bed Mobility               General bed mobility comments: OOB in recliner  Transfers Overall transfer level: Needs assistance Equipment used: Rolling walker (2 wheeled) Transfers: Sit to/from [facility] Sit to Stand: Min guard Stand pivot transfers: Min guard;Min assist       General transfer comment: 25% VC's on proper hand placement and safety with with turns.  No c/o dizziness this session.  Ambulation/Gait Ambulation/Gait assistance: Supervision;Min guard Gait Distance (Feet): 40 Feet Assistive device: Rolling walker (2 wheeled) Gait Pattern/deviations: Step-to pattern;Decreased stance time - left Gait velocity: decreased   General Gait Details: tolerated amb in hallway with 25% VC's on proper sequencing and proper walker  to self distance.   Stairs             Wheelchair Mobility    Modified Rankin (Stroke Patients Only)       Balance                                            Cognition Arousal/Alertness: Awake/alert Behavior During Therapy: WFL for tasks assessed/performed Overall Cognitive Status: Within Functional Limits for tasks assessed                                        Exercises   5 reps all standing TE's following HEP handout    General Comments        Pertinent Vitals/Pain Pain Assessment: 0-10 Pain Score: 4  Pain Location: Lt hip Pain Descriptors / Indicators: Aching;Discomfort;Operative site guarding Pain Intervention(s): Monitored during session;Premedicated before session;Repositioned;Ice applied    Home Living                      Prior Function            PT Goals (current goals can now be found in the care plan section) Progress towards PT goals: Progressing toward goals    Frequency    7X/week  PT Plan Current plan remains appropriate    Co-evaluation              AM-PAC PT "6 Clicks" Mobility   Outcome Measure  Help needed turning from your back to your side while in a flat bed without using bedrails?: A Little Help needed moving from lying on your back to sitting on the side of a flat bed without using bedrails?: A Little Help needed moving to and from a bed to a chair (including a wheelchair)?: A Little Help needed standing up from a chair using your arms (e.g., wheelchair or bedside chair)?: A Little Help needed to walk in hospital room?: A Little Help needed climbing 3-5 steps with a railing? : A Little 6 Click Score: 18    End of Session Equipment Utilized During Treatment: Gait belt Activity Tolerance: Patient tolerated treatment well Patient left: in chair;with call bell/phone within reach Nurse Communication: Mobility status PT Visit Diagnosis: Other abnormalities of gait  and mobility (R26.89);Muscle weakness (generalized) (M62.81);Difficulty in walking, not elsewhere classified (R26.2)     Time: 1000-1025 PT Time Calculation (min) (ACUTE ONLY): 25 min  Charges:  $Gait Training: 8-22 mins $Therapeutic Exercise: 8-22 mins                     {Fernandez Kenley  PTA Acute  Rehabilitation Services Pager      [phone] Office      [phone]

## 2020-10-01 NOTE — Progress Notes (Signed)
Physical Therapy Treatment Patient Details Name: Christian Dodson MRN: [id number] DOB: Sep 30, 1946 Today's Date: 10/01/2020    History of Present Illness Patient is 74 y.o. male s/p Lt THA anterior approach on 09/30/20 with PMH significant for HTN, GERD, BPH, OA, Rt UKA in 2013.    PT Comments    POD # 1 pm session Spouse present during session.  Tolerated amb in hallway then practiced stairs.  General stair comments: with spouse present and with "hands on" assist pt practiced one step he has to enter his home with 25% VC's on proper walker placement and sequencing.  Then returned to room to perform some TE's following HEP handout.  Instructed on proper tech, freq as well as use of ICE.   Addressed all mobility questions, discussed appropriate activity, educated on use of ICE.  Pt ready for D/C to home.   Follow Up Recommendations  Follow surgeons recommendation for DC plan and follow-up therapies     Equipment Recommendations  3in1 (PT)    Recommendations for Other Services       Precautions / Restrictions Precautions Precautions: Fall Restrictions LLE Weight Bearing: Weight bearing as tolerated    Mobility  Bed Mobility               General bed mobility comments: OOB in recliner  Transfers Overall transfer level: Needs assistance Equipment used: Rolling walker (2 wheeled) Transfers: Sit to/from [facility] Sit to Stand: Min guard Stand pivot transfers: Min guard;Min assist       General transfer comment: 25% VC's on proper hand placement and safety with with turns.  No c/o dizziness this session.  Ambulation/Gait Ambulation/Gait assistance: Supervision;Min guard Gait Distance (Feet): 35 Feet Assistive device: Rolling walker (2 wheeled) Gait Pattern/deviations: Step-to pattern;Decreased stance time - left Gait velocity: decreased   General Gait Details: tolerated amb in hallway with 25% VC's on proper sequencing and proper walker to self  distance.   Stairs Stairs: Yes Stairs assistance: Min guard Stair Management: No rails;Step to pattern;Forwards;With walker Number of Stairs: 1 General stair comments: with spouse present and with "hands on" assist pt practiced one step he has to enter his home with 25% VC's on proper walker placement and sequencing   Wheelchair Mobility    Modified Rankin (Stroke Patients Only)       Balance                                            Cognition Arousal/Alertness: Awake/alert Behavior During Therapy: WFL for tasks assessed/performed Overall Cognitive Status: Within Functional Limits for tasks assessed                                        Exercises   Total Hip Replacement TE's following HEP Handout 10 reps ankle pumps 05 reps knee presses 05 reps heel slides 05 reps SAQ's 05 reps ABD Instructed how to use a belt loop to assist  Followed by ICE     General Comments        Pertinent Vitals/Pain Pain Assessment: 0-10 Pain Score: 4  Pain Location: Lt hip Pain Descriptors / Indicators: Aching;Discomfort;Operative site guarding Pain Intervention(s): Monitored during session;Premedicated before session;Repositioned;Ice applied    Home Living  Prior Function            PT Goals (current goals can now be found in the care plan section) Progress towards PT goals: Progressing toward goals    Frequency    7X/week      PT Plan Current plan remains appropriate    Co-evaluation              AM-PAC PT "6 Clicks" Mobility   Outcome Measure  Help needed turning from your back to your side while in a flat bed without using bedrails?: A Little Help needed moving from lying on your back to sitting on the side of a flat bed without using bedrails?: A Little Help needed moving to and from a bed to a chair (including a wheelchair)?: A Little Help needed standing up from a chair using your arms  (e.g., wheelchair or bedside chair)?: A Little Help needed to walk in hospital room?: A Little Help needed climbing 3-5 steps with a railing? : A Little 6 Click Score: 18    End of Session Equipment Utilized During Treatment: Gait belt Activity Tolerance: Patient tolerated treatment well Patient left: in chair;with call bell/phone within reach Nurse Communication: Mobility status PT Visit Diagnosis: Other abnormalities of gait and mobility (R26.89);Muscle weakness (generalized) (M62.81);Difficulty in walking, not elsewhere classified (R26.2)     Time: 1130-1155 PT Time Calculation (min) (ACUTE ONLY): 25 min  Charges:  $Gait Training: 8-22 mins $Therapeutic Exercise: 8-22 mins                     {Raj Landress  PTA Acute  Rehabilitation Services Pager      [phone] Office      [phone]

## 2020-11-03 ENCOUNTER — Other Ambulatory Visit: Payer: Self-pay | Admitting: Internal Medicine

## 2020-11-14 ENCOUNTER — Other Ambulatory Visit: Payer: Self-pay | Admitting: Internal Medicine

## 2020-11-17 NOTE — Telephone Encounter (Signed)
    Patient calling for status of refills

## 2021-04-27 ENCOUNTER — Ambulatory Visit: Payer: Medicare HMO

## 2021-04-27 ENCOUNTER — Other Ambulatory Visit: Payer: Self-pay

## 2021-04-27 ENCOUNTER — Encounter: Payer: Self-pay | Admitting: Internal Medicine

## 2021-04-27 ENCOUNTER — Ambulatory Visit (INDEPENDENT_AMBULATORY_CARE_PROVIDER_SITE_OTHER): Payer: Medicare HMO | Admitting: Internal Medicine

## 2021-04-27 VITALS — BP 130/110 | HR 94 | Temp 97.9°F | Resp 18 | Ht 62.0 in | Wt 184.2 lb

## 2021-04-27 DIAGNOSIS — R350 Frequency of micturition: Secondary | ICD-10-CM

## 2021-04-27 DIAGNOSIS — R7303 Prediabetes: Secondary | ICD-10-CM

## 2021-04-27 DIAGNOSIS — Z0000 Encounter for general adult medical examination without abnormal findings: Secondary | ICD-10-CM

## 2021-04-27 DIAGNOSIS — N401 Enlarged prostate with lower urinary tract symptoms: Secondary | ICD-10-CM | POA: Diagnosis not present

## 2021-04-27 DIAGNOSIS — Z1211 Encounter for screening for malignant neoplasm of colon: Secondary | ICD-10-CM | POA: Diagnosis not present

## 2021-04-27 DIAGNOSIS — K219 Gastro-esophageal reflux disease without esophagitis: Secondary | ICD-10-CM

## 2021-04-27 DIAGNOSIS — I10 Essential (primary) hypertension: Secondary | ICD-10-CM

## 2021-04-27 LAB — LIPID PANEL
Cholesterol: 155 mg/dL (ref 0–200)
HDL: 33.1 mg/dL — ABNORMAL LOW (ref >39.00)
NonHDL: 121.42
Total CHOL/HDL Ratio: 5
Triglycerides: 277 mg/dL — ABNORMAL HIGH (ref 0.0–149.0)
VLDL: 55.4 mg/dL — ABNORMAL HIGH (ref 0.0–40.0)

## 2021-04-27 LAB — CBC
HCT: 44.5 % (ref 39.0–52.0)
Hemoglobin: 15.8 g/dL (ref 13.0–17.0)
MCHC: 35.4 g/dL (ref 30.0–36.0)
MCV: 90.8 fl (ref 78.0–100.0)
Platelets: 209 10*3/uL (ref 150.0–400.0)
RBC: 4.9 Mil/uL (ref 4.22–5.81)
RDW: 13.3 % (ref 11.5–15.5)
WBC: 7.5 10*3/uL (ref 4.0–10.5)

## 2021-04-27 LAB — COMPREHENSIVE METABOLIC PANEL
ALT: 34 U/L (ref 0–53)
AST: 23 U/L (ref 0–37)
Albumin: 4.4 g/dL (ref 3.5–5.2)
Alkaline Phosphatase: 75 U/L (ref 39–117)
BUN: 15 mg/dL (ref 6–23)
CO2: 26 mEq/L (ref 19–32)
Calcium: 9.8 mg/dL (ref 8.4–10.5)
Chloride: 103 mEq/L (ref 96–112)
Creatinine, Ser: 1.07 mg/dL (ref 0.40–1.50)
GFR: 69.19 mL/min (ref >60.00)
Glucose, Bld: 175 mg/dL — ABNORMAL HIGH (ref 70–99)
Potassium: 4 mEq/L (ref 3.5–5.1)
Sodium: 139 mEq/L (ref 135–145)
Total Bilirubin: 0.6 mg/dL (ref 0.2–1.2)
Total Protein: 7 g/dL (ref 6.0–8.3)

## 2021-04-27 LAB — LDL CHOLESTEROL, DIRECT: Direct LDL: 85 mg/dL

## 2021-04-27 LAB — HEMOGLOBIN A1C: Hgb A1c MFr Bld: 7.1 % — ABNORMAL HIGH (ref 4.6–6.5)

## 2021-04-27 LAB — PSA: PSA: 1.68 ng/mL (ref 0.10–4.00)

## 2021-04-27 NOTE — Assessment & Plan Note (Signed)
Checking PSA and seeing urology yearly. Symptoms controlled at this time.

## 2021-04-27 NOTE — Assessment & Plan Note (Signed)
Flu shot yearly. Covid-19 3 shots counseled about 4th. Pneumonia complete. Shingrix counseled. Tetanus due 2023. Colonoscopy referral to GI done for colonoscopy. Counseled about sun safety and mole surveillance. Counseled about the dangers of distracted driving. Given 10 year screening recommendations.

## 2021-04-27 NOTE — Assessment & Plan Note (Signed)
Checking HgA1c, adjust as needed.  

## 2021-04-27 NOTE — Patient Instructions (Signed)

## 2021-04-27 NOTE — Progress Notes (Signed)
   Subjective:   Patient ID: Christian Dodson, male    DOB: Jan 17, 1946, 75 y.o.   MRN: [id number]  HPI The patient is a 75 YO man coming in for physical.  PMH, [facility], social history reviewed and updated.  Review of Systems  Constitutional: Negative.   HENT: Negative.   Eyes: Negative.   Respiratory: Negative for cough, chest tightness and shortness of breath.   Cardiovascular: Negative for chest pain, palpitations and leg swelling.  Gastrointestinal: Negative for abdominal distention, abdominal pain, constipation, diarrhea, nausea and vomiting.  Musculoskeletal: Negative.   Skin: Negative.   Neurological: Negative.   Psychiatric/Behavioral: Negative.     Objective:  Physical Exam Constitutional:      Appearance: He is well-developed.  HENT:     Head: Normocephalic and atraumatic.  Cardiovascular:     Rate and Rhythm: Normal rate and regular rhythm.  Pulmonary:     Effort: Pulmonary effort is normal. No respiratory distress.     Breath sounds: Normal breath sounds. No wheezing or rales.  Abdominal:     General: Bowel sounds are normal. There is no distension.     Palpations: Abdomen is soft.     Tenderness: There is no abdominal tenderness. There is no rebound.  Musculoskeletal:     Cervical back: Normal range of motion.  Skin:    General: Skin is warm and dry.  Neurological:     Mental Status: He is alert and oriented to person, place, and time.     Coordination: Coordination normal.     Vitals:   04/27/21 0852  BP: (!) 130/110  Pulse: 94  Resp: 18  Temp: 97.9 F (36.6 C)  TempSrc: Oral  SpO2: 97%  Weight: 184 lb 3.2 oz (83.6 kg)  Height: 5\' 2"  (1.575 m)    This visit occurred during the SARS-CoV-2 public health emergency.  Safety protocols were in place, including screening questions prior to the visit, additional usage of staff PPE, and extensive cleaning of exam room while observing appropriate contact time as indicated for disinfecting solutions.    Assessment & Plan:

## 2021-04-27 NOTE — Assessment & Plan Note (Signed)
BP initially elevated but recheck at goal. He is checking intermittently at home and at goal typically around 130/70-80. Taking hctz 25 mg daily and lisinopril 40 mg daily. Checking CMP and adjust as needed.

## 2021-04-27 NOTE — Assessment & Plan Note (Signed)
Taking prilosec otc and symptoms well controlled.

## 2021-05-04 ENCOUNTER — Encounter: Payer: Self-pay | Admitting: Internal Medicine

## 2021-05-06 MED ORDER — SIMVASTATIN 20 MG PO TABS: 20.0000 mg | ORAL_TABLET | Freq: Every day | ORAL | 3 refills | Status: DC

## 2021-05-07 ENCOUNTER — Encounter: Payer: Self-pay | Admitting: Internal Medicine

## 2021-05-13 ENCOUNTER — Ambulatory Visit (INDEPENDENT_AMBULATORY_CARE_PROVIDER_SITE_OTHER): Payer: Medicare HMO

## 2021-05-13 ENCOUNTER — Other Ambulatory Visit: Payer: Self-pay

## 2021-05-13 VITALS — BP 122/78 | HR 86 | Temp 98.1°F | Ht 62.0 in | Wt 181.4 lb

## 2021-05-13 DIAGNOSIS — Z0000 Encounter for general adult medical examination without abnormal findings: Secondary | ICD-10-CM | POA: Diagnosis not present

## 2021-05-13 NOTE — Progress Notes (Signed)
Subjective:   Christian Dodson is a 75 y.o. male who presents for Medicare Annual/Subsequent preventive examination.  Review of Systems    No ROS. Medicare Wellness Visit. Additional risk factors are reflected in social history. Cardiac Risk Factors include: advanced age (>24men, >32 women);dyslipidemia;family history of premature cardiovascular disease;hypertension;male gender;obesity (BMI >30kg/m2) Sleep Patterns: No sleep issues, feels rested on waking and sleeps 7-8 hours nightly. Home Safety/Smoke Alarms: Feels safe in home; uses home alarm. Smoke alarms in place. Living environment: 2-story home; Lives with spouse on main level; no needs for DME; good support system. Seat Belt Safety/Bike Helmet: Wears seat belt.    Objective:    Today's Vitals   05/13/21 1059  BP: 122/78  Pulse: 86  Temp: 98.1 F (36.7 C)  SpO2: 97%  Weight: 181 lb 6.4 oz (82.3 kg)  Height: 5\' 2"  (1.575 m)  PainSc: 0-No pain   Body mass index is 33.18 kg/m.  Advanced Directives 05/13/2021 09/30/2020 09/18/2020 04/24/2020 04/24/2019 04/17/2018 04/14/2017  Does Patient Have a Medical Advance Directive? Yes Yes Yes Yes Yes Yes Yes  Type of Advance Directive - [place];Living will [place];Living will Living will;Healthcare Power of [place];Living will [place];Living will [place];Living will  Does patient want to make changes to medical advance directive? No - Patient declined No - Patient declined No - Patient declined No - Patient declined - - -  Copy of [place] in Chart? - Yes - validated most recent copy scanned in chart (See row information) Yes - validated most recent copy scanned in chart (See row information) No - copy requested No - copy requested No - copy requested Yes    Current Medications (verified) Outpatient Encounter Medications as of 05/13/2021  Medication Sig  .  amoxicillin (AMOXIL) 500 MG capsule amoxicillin 500 mg capsule  TAKE 4 CAPSULES BY MOUTH 1 HOUR BEFORE DENTAL WORK  . Cetirizine HCl (ZYRTEC ALLERGY) 10 MG CAPS Zyrtec 10 mg capsule  . cholecalciferol (VITAMIN D3) 25 MCG (1000 UNIT) tablet Take 1,000 Units by mouth daily.  . finasteride (PROSCAR) 5 MG tablet TAKE 1 TABLET DAILY (Patient not taking: Reported on 04/27/2021)  . GLUCOSAMINE-CHONDROITIN PO Take 1 tablet by mouth 2 (two) times daily.   . hydrochlorothiazide (HYDRODIURIL) 25 MG tablet TAKE 1 TABLET DAILY  . lisinopril (ZESTRIL) 40 MG tablet TAKE 1 TABLET DAILY  . omeprazole (PRILOSEC) 20 MG capsule Take 20 mg by mouth daily.  . simvastatin (ZOCOR) 20 MG tablet Take 1 tablet (20 mg total) by mouth at bedtime.  Marland Kitchen zinc gluconate 50 MG tablet Take 50 mg by mouth daily.   No facility-administered encounter medications on file as of 05/13/2021.    Allergies (verified) Patient has no known allergies.   History: Past Medical History:  Diagnosis Date  . Arthritis   . BPH (benign prostatic hyperplasia) 10/19/2013  . GERD (gastroesophageal reflux disease)   . History of stomach ulcers    age 68  . Hypertension   . Pneumonia    x4  . Pre-diabetes   . Skin cancer    Past Surgical History:  Procedure Laterality Date  . APPENDECTOMY    . COLONOSCOPY    . INGUINAL HERNIA REPAIR Right   . left shoulder rotator and bicep  01/10/2017  . MEDIAL PARTIAL KNEE REPLACEMENT Right 2013  . TONSILLECTOMY AND ADENOIDECTOMY    . TOTAL HIP ARTHROPLASTY Left 09/30/2020  Procedure: TOTAL HIP ARTHROPLASTY ANTERIOR APPROACH;  Surgeon: Paralee Cancel, MD;  Location: [facility];  Service: Orthopedics;  Laterality: Left;  70 mins  . UPPER GI ENDOSCOPY     Family History  Problem Relation Age of Onset  . Hypertension Mother   . Cancer Father        lung  . Hyperlipidemia Maternal Grandfather   . Heart disease Maternal Grandfather   . Stroke Paternal Grandmother   . Heart disease Paternal Grandmother    . Hyperlipidemia Paternal Grandfather   . Diabetes Neg Hx    Social History   Socioeconomic History  . Marital status: Married    Spouse name: Not on file  . Number of children: 2  . Years of education: Not on file  . Highest education level: Not on file  Occupational History  . Occupation: retired  Tobacco Use  . Smoking status: Never Smoker  . Smokeless tobacco: Never Used  Vaping Use  . Vaping Use: Never used  Substance and Sexual Activity  . Alcohol use: Yes    Alcohol/week: 1.0 standard drink    Types: 1 Cans of beer per week    Comment: occassionally  . Drug use: No  . Sexual activity: Yes  Other Topics Concern  . Not on file  Social History Narrative  . Not on file   Social Determinants of Health   Financial Resource Strain: Low Risk   . Difficulty of Paying Living Expenses: Not hard at all  Food Insecurity: No Food Insecurity  . Worried About Charity fundraiser in the Last Year: Never true  . Ran Out of Food in the Last Year: Never true  Transportation Needs: No Transportation Needs  . Lack of Transportation (Medical): No  . Lack of Transportation (Non-Medical): No  Physical Activity: Sufficiently Active  . Days of Exercise per Week: 5 days  . Minutes of Exercise per Session: 30 min  Stress: No Stress Concern Present  . Feeling of Stress : Not at all  Social Connections: Socially Integrated  . Frequency of Communication with Friends and Family: More than three times a week  . Frequency of Social Gatherings with Friends and Family: More than three times a week  . Attends Religious Services: More than 4 times per year  . Active Member of Clubs or Organizations: Yes  . Attends Archivist Meetings: More than 4 times per year  . Marital Status: Married    Tobacco Counseling Counseling given: Not Answered   Clinical Intake:  Pre-visit preparation completed: Yes  Pain : No/denies pain Pain Score: 0-No pain     BMI - recorded:  33.18 Nutritional Status: BMI > 30  Obese Nutritional Risks: None Diabetes: No  How often do you need to have someone help you when you read instructions, pamphlets, or other written materials from your doctor or pharmacy?: 1 - Never What is the last grade level you completed in school?: Associate's degree  Diabetic? no  Interpreter Needed?: No  Information entered by :: Lisette Abu, LPN   Activities of Daily Living In your present state of health, do you have any difficulty performing the following activities: 05/13/2021 09/30/2020  Hearing? N Y  Vision? N N  Difficulty concentrating or making decisions? N N  Walking or climbing stairs? N Y  Dressing or bathing? N N  Doing errands, shopping? N N  Preparing Food and eating ? N -  Using the Toilet? N -  In the past six  months, have you accidently leaked urine? N -  Do you have problems with loss of bowel control? N -  Managing your Medications? N -  Managing your Finances? N -  Housekeeping or managing your Housekeeping? N -  Some recent data might be hidden    Patient Care Team: Hoyt Koch, MD as PCP - General (Internal Medicine) Ardis Hughs, MD as Attending Physician (Urology) Jola Schmidt, MD as Consulting Physician (Ophthalmology)  Indicate any recent Medical Services you may have received from other than [facility] providers in the past year (date may be approximate).     Assessment:   This is a routine wellness examination for [facility].  Hearing/Vision screen No exam data present  Dietary issues and exercise activities discussed: Current Exercise Habits: Structured exercise class, Type of exercise: walking;treadmill;stretching;strength training/weights;Other - see comments (golfing, water aerobics), Time (Minutes): 30, Frequency (Times/Week): 5, Weekly Exercise (Minutes/Week): 150, Intensity: Moderate, Exercise limited by: None identified  Goals Addressed            This Visit's Progress    . Be as healthy and active as long as possible   On track    Continue to exercise and eat healthy    . Patient Stated   On track    I want to lose weight by cutting back on the amount of sugar and snacks I eat. Continue to exercise. Enjoy life, family and play golf.      Depression Screen PHQ 2/9 Scores 05/13/2021 04/27/2021 04/24/2020 04/24/2019 04/17/2018 04/14/2017 04/14/2017  PHQ - 2 Score 0 0 0 0 0 0 0  PHQ- 9 Score - - - - 0 0 -    Fall Risk Fall Risk  05/13/2021 04/27/2021 04/27/2021 04/24/2020 04/24/2019  Falls in the past year? 0 0 - 0 0  Number falls in past yr: 0 0 1 0 -  Injury with Fall? 0 0 1 0 -  Risk for fall due to : No Fall Risks - - No Fall Risks -  Follow up Falls evaluation completed - - Falls evaluation completed;Education provided -    FALL RISK PREVENTION PERTAINING TO THE HOME:  Any stairs in or around the home? Yes  If so, are there any without handrails? No  Home free of loose throw rugs in walkways, pet beds, electrical cords, etc? Yes  Adequate lighting in your home to reduce risk of falls? Yes   ASSISTIVE DEVICES UTILIZED TO PREVENT FALLS:  Life alert? No  Use of a cane, walker or w/c? No  Grab bars in the bathroom? No  Shower chair or bench in shower? No  Elevated toilet seat or a handicapped toilet? No   TIMED UP AND GO:  Was the test performed? No .  Length of time to ambulate 10 feet: 0 sec.   Gait steady and fast without use of assistive device  Cognitive Function: Normal cognitive status assessed by direct observation by this Nurse Health Advisor. No abnormalities found.   MMSE - Mini Mental State Exam 04/17/2018 04/17/2018  Not completed: (No Data) Refused        Immunizations Immunization History  Administered Date(s) Administered  . DT (Pediatric) 12/21/2011  . Fluad Quad(high Dose 65+) 09/19/2020  . Influenza, High Dose Seasonal PF 09/19/2013, 10/10/2016, 10/02/2018, 09/20/2019  . Influenza,inj,Quad PF,6+ Mos 09/03/2014  .  Influenza-Unspecified 08/21/2015, 10/03/2017, 09/20/2019  . PFIZER(Purple Top)SARS-COV-2 Vaccination 01/24/2020, 02/14/2020, 11/12/2020  . Pneumococcal Conjugate-13 03/24/2016  . Pneumococcal Polysaccharide-23 03/20/2013  . Tdap 10/03/2012  .  Zoster 09/03/2014  . Zoster Recombinat (Shingrix) 10/31/2018, 01/30/2019    TDAP status: Up to date  Flu Vaccine status: Up to date  Pneumococcal vaccine status: Up to date  Covid-19 vaccine status: Completed vaccines  Qualifies for Shingles Vaccine? Yes   Zostavax completed Yes   Shingrix Completed?: Yes  Screening Tests Health Maintenance  Topic Date Due  . COLONOSCOPY (Pts 45-37yrs Insurance coverage will need to be confirmed)  09/25/2020  . PNA vac Low Risk Adult (2 of 2 - PPSV23) 12/20/2048 (Originally 03/20/2018)  . INFLUENZA VACCINE  07/20/2021  . TETANUS/TDAP  10/03/2022  . COVID-19 Vaccine  Completed  . Hepatitis C Screening  Completed  . HPV VACCINES  Aged Out    Health Maintenance  Health Maintenance Due  Topic Date Due  . COLONOSCOPY (Pts 45-58yrs Insurance coverage will need to be confirmed)  09/25/2020    Colorectal cancer screening: Type of screening: Colonoscopy. Completed 12/20/2008. Repeat every 10 years (Schedled for July 2022)  Lung Cancer Screening: (Low Dose CT Chest recommended if Age 10-80 years, 30 pack-year currently smoking OR have quit w/in 15years.) does not qualify.   Lung Cancer Screening Referral: no  Additional Screening:  Hepatitis C Screening: does qualify; Completed yes  Vision Screening: Recommended annual ophthalmology exams for early detection of glaucoma and other disorders of the eye. Is the patient up to date with their annual eye exam?  Yes  Who is the provider or what is the name of the office in which the patient attends annual eye exams? Jola Schmidt, MD. If pt is not established with a provider, would they like to be referred to a provider to establish care? No .   Dental  Screening: Recommended annual dental exams for proper oral hygiene  Community Resource Referral / Chronic Care Management: CRR required this visit?  No   CCM required this visit?  No      Plan:     I have personally reviewed and noted the following in the patient's chart:   . Medical and social history . Use of alcohol, tobacco or illicit drugs  . Current medications and supplements including opioid prescriptions. Patient is not currently taking opioid prescriptions. . Functional ability and status . Nutritional status . Physical activity . Advanced directives . List of other physicians . Hospitalizations, surgeries, and ER visits in previous 12 months . Vitals . Screenings to include cognitive, depression, and falls . Referrals and appointments  In addition, I have reviewed and discussed with patient certain preventive protocols, quality metrics, and best practice recommendations. A written personalized care plan for preventive services as well as general preventive health recommendations were provided to patient.     Sheral Flow, LPN   [id number]   Nurse Notes: n/a

## 2021-05-13 NOTE — Patient Instructions (Addendum)
Christian Dodson , Thank you for taking time to come for your Medicare Wellness Visit. I appreciate your ongoing commitment to your health goals. Please review the following plan we discussed and let me know if I can assist you in the future.   Screening recommendations/referrals: Colonoscopy: scheduled for July 2022; Dr. Hilarie Fredrickson Recommended yearly ophthalmology/optometry visit for glaucoma screening and checkup Recommended yearly dental visit for hygiene and checkup  Vaccinations: Influenza vaccine: 09/19/2020 Pneumococcal vaccine: 03/20/2013, 03/24/2016 Tdap vaccine: 10/15/2013l; due every 10 years Shingles vaccine: 10/31/2018, 01/30/2019   Covid-19: 01/24/2020, 02/14/2020, 11/12/2020  Advanced directives: Documents on file.  Conditions/risks identified: Yes; Reviewed health maintenance screenings with patient today and relevant education, vaccines, and/or referrals were provided. Please continue to do your personal lifestyle choices by: daily care of teeth and gums, regular physical activity (goal should be 5 days a week for 30 minutes), eat a healthy diet, avoid tobacco and drug use, limiting any alcohol intake, taking a low-dose aspirin (if not allergic or have been advised by your provider otherwise) and taking vitamins and minerals as recommended by your provider. Continue doing brain stimulating activities (puzzles, reading, adult coloring books, staying active) to keep memory sharp. Continue to eat heart healthy diet (full of fruits, vegetables, whole grains, lean protein, water--limit salt, fat, and sugar intake) and increase physical activity as tolerated.  Next appointment: Please schedule your next Medicare Wellness Visit with your Nurse Health Advisor in 1 year by calling [phone].  Preventive Care 38 Years and Older, Male Preventive care refers to lifestyle choices and visits with your health care provider that can promote health and wellness. What does preventive care include?  A yearly  physical exam. This is also called an annual well check.  Dental exams once or twice a year.  Routine eye exams. Ask your health care provider how often you should have your eyes checked.  Personal lifestyle choices, including:  Daily care of your teeth and gums.  Regular physical activity.  Eating a healthy diet.  Avoiding tobacco and drug use.  Limiting alcohol use.  Practicing safe sex.  Taking low doses of aspirin every day.  Taking vitamin and mineral supplements as recommended by your health care provider. What happens during an annual well check? The services and screenings done by your health care provider during your annual well check will depend on your age, overall health, lifestyle risk factors, and family history of disease. Counseling  Your health care provider may ask you questions about your:  Alcohol use.  Tobacco use.  Drug use.  Emotional well-being.  Home and relationship well-being.  Sexual activity.  Eating habits.  History of falls.  Memory and ability to understand (cognition).  Work and work Statistician. Screening  You may have the following tests or measurements:  Height, weight, and BMI.  Blood pressure.  Lipid and cholesterol levels. These may be checked every 5 years, or more frequently if you are over 37 years old.  Skin check.  Lung cancer screening. You may have this screening every year starting at age 72 if you have a 30-pack-year history of smoking and currently smoke or have quit within the past 15 years.  Fecal occult blood test (FOBT) of the stool. You may have this test every year starting at age 14.  Flexible sigmoidoscopy or colonoscopy. You may have a sigmoidoscopy every 5 years or a colonoscopy every 10 years starting at age 75.  Prostate cancer screening. Recommendations will vary depending on your family history and  other risks.  Hepatitis C blood test.  Hepatitis B blood test.  Sexually transmitted  disease (STD) testing.  Diabetes screening. This is done by checking your blood sugar (glucose) after you have not eaten for a while (fasting). You may have this done every 1-3 years.  Abdominal aortic aneurysm (AAA) screening. You may need this if you are a current or former smoker.  Osteoporosis. You may be screened starting at age 87 if you are at high risk. Talk with your health care provider about your test results, treatment options, and if necessary, the need for more tests. Vaccines  Your health care provider may recommend certain vaccines, such as:  Influenza vaccine. This is recommended every year.  Tetanus, diphtheria, and acellular pertussis (Tdap, Td) vaccine. You may need a Td booster every 10 years.  Zoster vaccine. You may need this after age 68.  Pneumococcal 13-valent conjugate (PCV13) vaccine. One dose is recommended after age 74.  Pneumococcal polysaccharide (PPSV23) vaccine. One dose is recommended after age 1. Talk to your health care provider about which screenings and vaccines you need and how often you need them. This information is not intended to replace advice given to you by your health care provider. Make sure you discuss any questions you have with your health care provider. Document Released: 01/02/2016 Document Revised: 08/25/2016 Document Reviewed: 10/07/2015 Elsevier Interactive Patient Education  2017 [employer] Prevention in the Home Falls can cause injuries. They can happen to people of all ages. There are many things you can do to make your home safe and to help prevent falls. What can I do on the outside of my home?  Regularly fix the edges of walkways and driveways and fix any cracks.  Remove anything that might make you trip as you walk through a door, such as a raised step or threshold.  Trim any bushes or trees on the path to your home.  Use bright outdoor lighting.  Clear any walking paths of anything that might make someone  trip, such as rocks or tools.  Regularly check to see if handrails are loose or broken. Make sure that both sides of any steps have handrails.  Any raised decks and porches should have guardrails on the edges.  Have any leaves, snow, or ice cleared regularly.  Use sand or salt on walking paths during winter.  Clean up any spills in your garage right away. This includes oil or grease spills. What can I do in the bathroom?  Use night lights.  Install grab bars by the toilet and in the tub and shower. Do not use towel bars as grab bars.  Use non-skid mats or decals in the tub or shower.  If you need to sit down in the shower, use a plastic, non-slip stool.  Keep the floor dry. Clean up any water that spills on the floor as soon as it happens.  Remove soap buildup in the tub or shower regularly.  Attach bath mats securely with double-sided non-slip rug tape.  Do not have throw rugs and other things on the floor that can make you trip. What can I do in the bedroom?  Use night lights.  Make sure that you have a light by your bed that is easy to reach.  Do not use any sheets or blankets that are too big for your bed. They should not hang down onto the floor.  Have a firm chair that has side arms. You can use this for  support while you get dressed.  Do not have throw rugs and other things on the floor that can make you trip. What can I do in the kitchen?  Clean up any spills right away.  Avoid walking on wet floors.  Keep items that you use a lot in easy-to-reach places.  If you need to reach something above you, use a strong step stool that has a grab bar.  Keep electrical cords out of the way.  Do not use floor polish or wax that makes floors slippery. If you must use wax, use non-skid floor wax.  Do not have throw rugs and other things on the floor that can make you trip. What can I do with my stairs?  Do not leave any items on the stairs.  Make sure that there  are handrails on both sides of the stairs and use them. Fix handrails that are broken or loose. Make sure that handrails are as long as the stairways.  Check any carpeting to make sure that it is firmly attached to the stairs. Fix any carpet that is loose or worn.  Avoid having throw rugs at the top or bottom of the stairs. If you do have throw rugs, attach them to the floor with carpet tape.  Make sure that you have a light switch at the top of the stairs and the bottom of the stairs. If you do not have them, ask someone to add them for you. What else can I do to help prevent falls?  Wear shoes that:  Do not have high heels.  Have rubber bottoms.  Are comfortable and fit you well.  Are closed at the toe. Do not wear sandals.  If you use a stepladder:  Make sure that it is fully opened. Do not climb a closed stepladder.  Make sure that both sides of the stepladder are locked into place.  Ask someone to hold it for you, if possible.  Clearly mark and make sure that you can see:  Any grab bars or handrails.  First and last steps.  Where the edge of each step is.  Use tools that help you move around (mobility aids) if they are needed. These include:  Canes.  Walkers.  Scooters.  Crutches.  Turn on the lights when you go into a dark area. Replace any light bulbs as soon as they burn out.  Set up your furniture so you have a clear path. Avoid moving your furniture around.  If any of your floors are uneven, fix them.  If there are any pets around you, be aware of where they are.  Review your medicines with your doctor. Some medicines can make you feel dizzy. This can increase your chance of falling. Ask your doctor what other things that you can do to help prevent falls. This information is not intended to replace advice given to you by your health care provider. Make sure you discuss any questions you have with your health care provider. Document Released:  10/02/2009 Document Revised: 05/13/2016 Document Reviewed: 01/10/2015 Elsevier Interactive Patient Education  2017 [employer].

## 2021-06-11 IMAGING — DX DG PORTABLE PELVIS
1 series · 1 of 1 positions shown · non-contrast
Comparison: Intraoperative views same date.

CLINICAL DATA: Postop left total hip arthroplasty.

EXAM:
PORTABLE PELVIS 1-2 VIEWS

[pelvis ap]
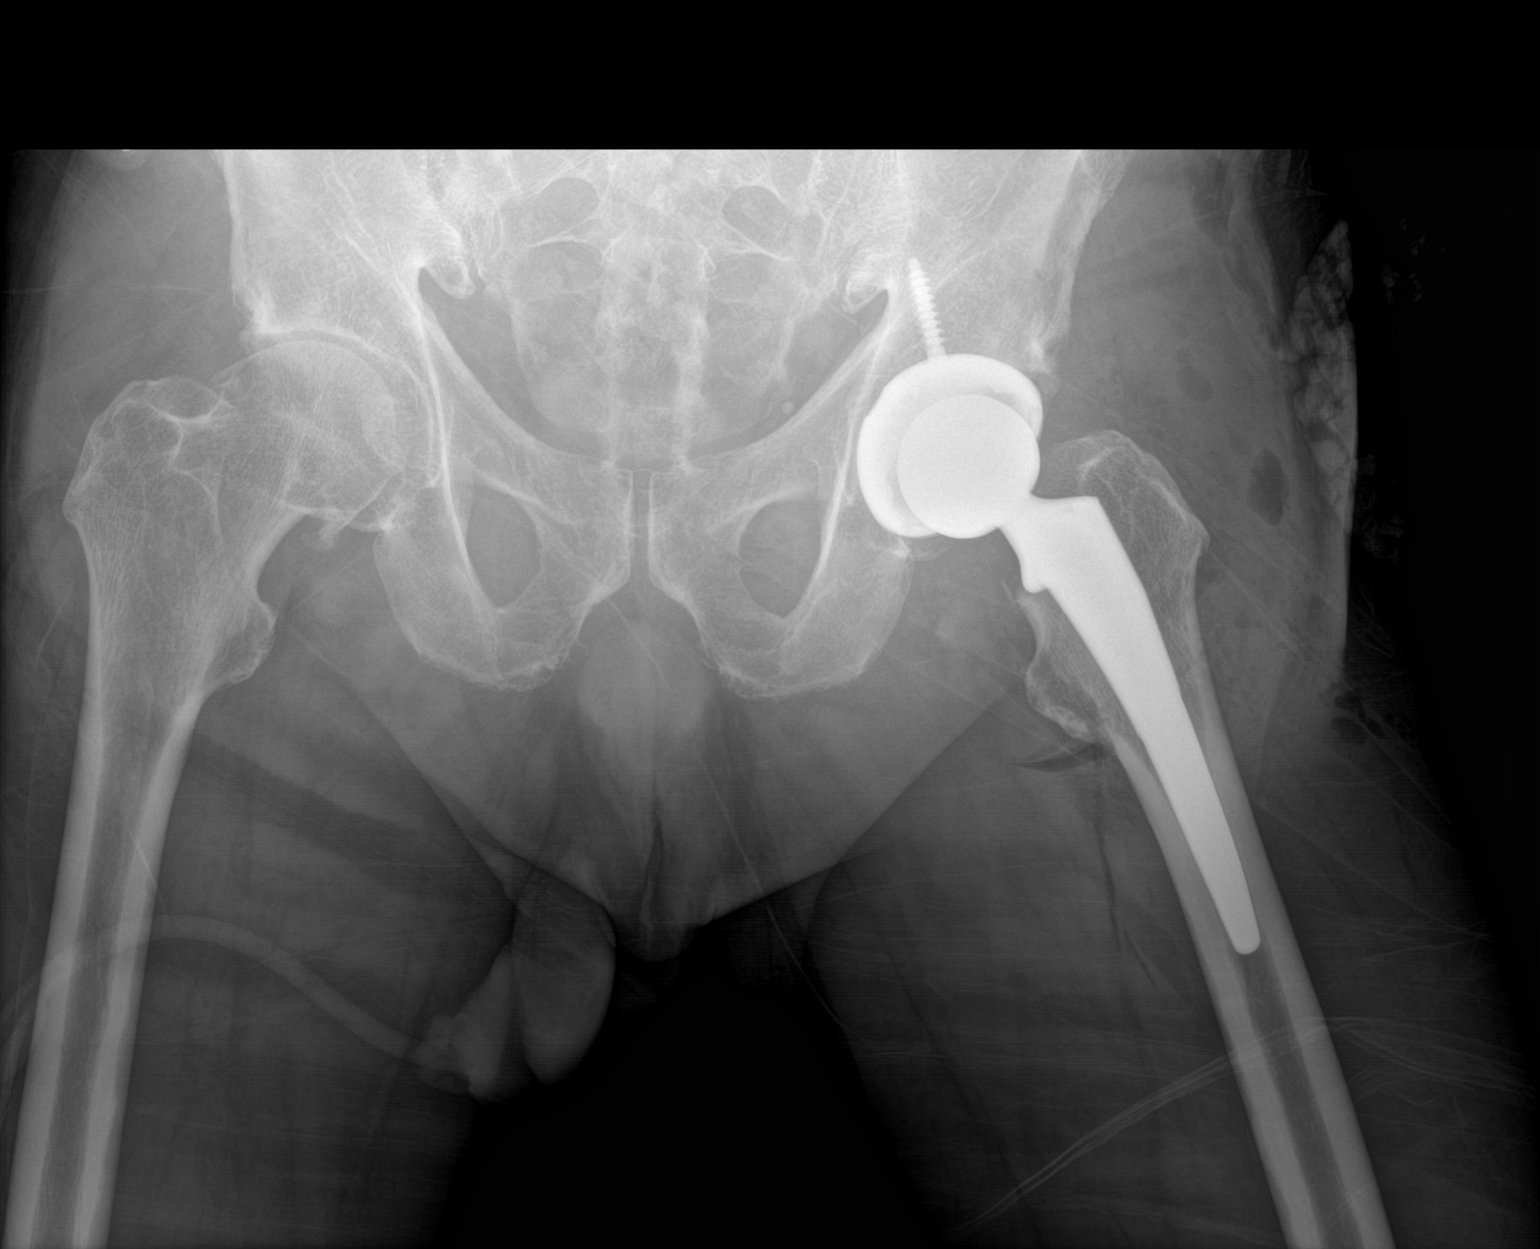

[1 of 1 positions shown; findings below may reference images not displayed]

FINDINGS: AP portable view of the lower pelvis, excluding the iliac crests.
Status post left total hip arthroplasty with a screw fixed
acetabular component. The hardware is well positioned. No evidence
of acute fracture or dislocation. Moderate right hip degenerative
changes are noted. There is gas within the soft tissue surrounding
the left hip.
IMPRESSION: No demonstrated complication following left total hip arthroplasty.
Moderate right hip degenerative changes.

## 2021-06-16 ENCOUNTER — Telehealth: Payer: Self-pay | Admitting: Internal Medicine

## 2021-06-16 NOTE — Telephone Encounter (Signed)
See below

## 2021-06-16 NOTE — Telephone Encounter (Signed)
Patient scheduled a follow up for 6 mo to check his A1C levels. Are you able to put in a order so he can have that re-checked before his appointment?   Scheduled on 12/10/2021.   Please advise.

## 2021-06-19 NOTE — Telephone Encounter (Signed)
LDVM with instructions from Dr. Sharlet Salina. Office number was provided in case he has additional questions and concerns.

## 2021-06-19 NOTE — Telephone Encounter (Signed)
It can just be checked with finger stick during visit so separate lab visit is not needed. He will get results during visit.

## 2021-07-01 ENCOUNTER — Ambulatory Visit (AMBULATORY_SURGERY_CENTER): Payer: Self-pay | Admitting: *Deleted

## 2021-07-01 ENCOUNTER — Other Ambulatory Visit: Payer: Self-pay

## 2021-07-01 VITALS — Ht 66.0 in | Wt 179.0 lb

## 2021-07-01 DIAGNOSIS — Z1211 Encounter for screening for malignant neoplasm of colon: Secondary | ICD-10-CM

## 2021-07-01 MED ORDER — SUTAB 1479-225-188 MG PO TABS: 1.0000 | ORAL_TABLET | ORAL | 0 refills | Status: DC

## 2021-07-01 NOTE — Progress Notes (Signed)
Patient is here in-person for PV. Patient denies any allergies to eggs or soy. Patient denies any problems with anesthesia/sedation. Patient denies any oxygen use at home. Patient denies taking any diet/weight loss medications or blood thinners. Patient is not being treated for MRSA or C-diff. Patient is aware of our care-partner policy and NIOEV-03 safety protocol. EMMI education assigned to the patient for the procedure, sent to [facility].   Patient is COVID-19 vaccinated, per patient.   Pt request Sutab pills-Prep Prescription coupon given to the patient.

## 2021-07-15 ENCOUNTER — Other Ambulatory Visit: Payer: Self-pay

## 2021-07-15 ENCOUNTER — Encounter: Payer: Self-pay | Admitting: Internal Medicine

## 2021-07-15 ENCOUNTER — Ambulatory Visit (AMBULATORY_SURGERY_CENTER): Payer: Medicare HMO | Admitting: Internal Medicine

## 2021-07-15 VITALS — BP 102/62 | HR 63 | Temp 97.5°F | Resp 12 | Ht 66.0 in | Wt 179.0 lb

## 2021-07-15 DIAGNOSIS — Z1211 Encounter for screening for malignant neoplasm of colon: Secondary | ICD-10-CM

## 2021-07-15 DIAGNOSIS — D122 Benign neoplasm of ascending colon: Secondary | ICD-10-CM

## 2021-07-15 DIAGNOSIS — D123 Benign neoplasm of transverse colon: Secondary | ICD-10-CM

## 2021-07-15 MED ORDER — SODIUM CHLORIDE 0.9 % IV SOLN: 500.0000 mL | Freq: Once | INTRAVENOUS | Status: DC

## 2021-07-15 NOTE — Op Note (Signed)
[place] Patient Name: Christian Dodson Procedure Date: 07/15/2021 9:00 AM MRN: [id number] Endoscopist: Jerene Bears , MD Age: 75 Referring MD:  Date of Birth: 04-11-1946 Gender: Male Account #: [id number] Procedure:                Colonoscopy Indications:              Screening for colorectal malignant neoplasm, Last                            colonoscopy > 10 years ago Medicines:                Monitored Anesthesia Care Procedure:                Pre-Anesthesia Assessment:                           - Prior to the procedure, a History and Physical                            was performed, and patient medications and                            allergies were reviewed. The patient's tolerance of                            previous anesthesia was also reviewed. The risks                            and benefits of the procedure and the sedation                            options and risks were discussed with the patient.                            All questions were answered, and informed consent                            was obtained. Prior Anticoagulants: The patient has                            taken no previous anticoagulant or antiplatelet                            agents. ASA Grade Assessment: II - A patient with                            mild systemic disease. After reviewing the risks                            and benefits, the patient was deemed in                            satisfactory condition to undergo the procedure.  After obtaining informed consent, the colonoscope                            was passed under direct vision. Throughout the                            procedure, the patient's blood pressure, pulse, and                            oxygen saturations were monitored continuously. The                            Olympus CF-HQ190L (Serial# [id number]) Colonoscope was                            introduced through the anus and  advanced to the                            cecum, identified by appendiceal orifice and                            ileocecal valve. The colonoscopy was performed                            without difficulty. The patient tolerated the                            procedure well. The quality of the bowel                            preparation was good. The ileocecal valve,                            appendiceal orifice, and rectum were photographed. Scope In: 9:15:51 AM Scope Out: 9:30:01 AM Scope Withdrawal Time: 0 hours 11 minutes 41 seconds  Total Procedure Duration: 0 hours 14 minutes 10 seconds  Findings:                 The digital rectal exam was normal.                           Three sessile polyps were found in the ascending                            colon. The polyps were 2 to 4 mm in size. These                            polyps were removed with a cold snare. Resection                            and retrieval were complete.                           Two sessile polyps were found in the transverse  colon. The polyps were 4 to 6 mm in size. These                            polyps were removed with a cold snare. Resection                            and retrieval were complete.                           Multiple small-mouthed diverticula were found in                            the sigmoid colon and descending colon.                           Internal hemorrhoids were found during                            retroflexion. The hemorrhoids were small. Complications:            No immediate complications. Estimated Blood Loss:     Estimated blood loss was minimal. Impression:               - Three 2 to 4 mm polyps in the ascending colon,                            removed with a cold snare. Resected and retrieved.                           - Two 4 to 6 mm polyps in the transverse colon,                            removed with a cold snare. Resected and  retrieved.                           - Diverticulosis in the sigmoid colon and in the                            descending colon.                           - Small internal hemorrhoids. Recommendation:           - Patient has a contact number available for                            emergencies. The signs and symptoms of potential                            delayed complications were discussed with the                            patient. Return to normal activities tomorrow.  Written discharge instructions were provided to the                            patient.                           - Resume previous diet.                           - Continue present medications.                           - Await pathology results.                           - Repeat colonoscopy is recommended. The                            colonoscopy date will be determined after pathology                            results from today's exam become available for                            review. Jerene Bears, MD 07/15/2021 12:03:43 PM This report has been signed electronically.

## 2021-07-15 NOTE — Progress Notes (Signed)
Called to room to assist during endoscopic procedure.  Patient ID and intended procedure confirmed with present staff. Received instructions for my participation in the procedure from the performing physician.  

## 2021-07-15 NOTE — Progress Notes (Signed)
PT taken to PACU. Monitors in place. VSS. Report given to RN. 

## 2021-07-15 NOTE — Patient Instructions (Signed)
YOU HAD AN ENDOSCOPIC PROCEDURE TODAY AT THE [facility]:   Refer to the procedure report that was given to you for any specific questions about what was found during the examination.  If the procedure report does not answer your questions, please call your gastroenterologist to clarify.  If you requested that your care partner not be given the details of your procedure findings, then the procedure report has been included in a sealed envelope for you to review at your convenience later.  YOU SHOULD EXPECT: Some feelings of bloating in the abdomen. Passage of more gas than usual.  Walking can help get rid of the air that was put into your GI tract during the procedure and reduce the bloating. If you had a lower endoscopy (such as a colonoscopy or flexible sigmoidoscopy) you may notice spotting of blood in your stool or on the toilet paper. If you underwent a bowel prep for your procedure, you may not have a normal bowel movement for a few days.  Please Note:  You might notice some irritation and congestion in your nose or some drainage.  This is from the oxygen used during your procedure.  There is no need for concern and it should clear up in a day or so.  SYMPTOMS TO REPORT IMMEDIATELY:   Following lower endoscopy (colonoscopy or flexible sigmoidoscopy):  Excessive amounts of blood in the stool  Significant tenderness or worsening of abdominal pains  Swelling of the abdomen that is new, acute  Fever of 100F or higher  For urgent or emergent issues, a gastroenterologist can be reached at any hour by calling [phone]. Do not use MyChart messaging for urgent concerns.    DIET:  We do recommend a small meal at first, but then you may proceed to your regular diet.  Drink plenty of fluids but you should avoid alcoholic beverages for 24 hours.  ACTIVITY:  You should plan to take it easy for the rest of today and you should NOT DRIVE or use heavy machinery until tomorrow (because  of the sedation medicines used during the test).    FOLLOW UP: Our staff will call the number listed on your records 48-72 hours following your procedure to check on you and address any questions or concerns that you may have regarding the information given to you following your procedure. If we do not reach you, we will leave a message.  We will attempt to reach you two times.  During this call, we will ask if you have developed any symptoms of COVID 19. If you develop any symptoms (ie: fever, flu-like symptoms, shortness of breath, cough etc.) before then, please call [phone].  If you test positive for Covid 19 in the 2 weeks post procedure, please call and report this information to us.    If any biopsies were taken you will be contacted by phone or by letter within the next 1-3 weeks.  Please call us at [phone] if you have not heard about the biopsies in 3 weeks.    SIGNATURES/CONFIDENTIALITY: You and/or your care partner have signed paperwork which will be entered into your electronic medical record.  These signatures attest to the fact that that the information above on your After Visit Summary has been reviewed and is understood.  Full responsibility of the confidentiality of this discharge information lies with you and/or your care-partner. 

## 2021-07-15 NOTE — Progress Notes (Signed)
VS by SM  Pt's states no medical or surgical changes since previsit or office visit.  

## 2021-07-17 ENCOUNTER — Telehealth: Payer: Self-pay

## 2021-07-17 NOTE — Telephone Encounter (Signed)
  Follow up Call-  Call back number 07/15/2021  Post procedure Call Back phone  # [phone]  Permission to leave phone message Yes  Some recent data might be hidden     Patient questions:  Do you have a fever, pain , or abdominal swelling? No. Pain Score  0 *  Have you tolerated food without any problems? Yes.    Have you been able to return to your normal activities? Yes.    Do you have any questions about your discharge instructions: Diet   No. Medications  No. Follow up visit  No.  Do you have questions or concerns about your Care? No.  Actions: * If pain score is 4 or above: No action needed, pain <4.

## 2021-07-26 ENCOUNTER — Encounter: Payer: Self-pay | Admitting: Internal Medicine

## 2021-11-06 ENCOUNTER — Telehealth: Payer: Medicare HMO | Admitting: Physician Assistant

## 2021-11-06 ENCOUNTER — Telehealth: Payer: Self-pay | Admitting: Internal Medicine

## 2021-11-06 DIAGNOSIS — U071 COVID-19: Secondary | ICD-10-CM | POA: Diagnosis not present

## 2021-11-06 MED ORDER — BENZONATATE 100 MG PO CAPS: 100.0000 mg | ORAL_CAPSULE | Freq: Three times a day (TID) | ORAL | 0 refills | Status: DC | PRN

## 2021-11-06 MED ORDER — MOLNUPIRAVIR EUA 200MG CAPSULE: 4.0000 | ORAL_CAPSULE | Freq: Two times a day (BID) | ORAL | 0 refills | Status: AC

## 2021-11-06 NOTE — Patient Instructions (Addendum)
Hello Taige,  You are being placed in the home monitoring program for COVID-19 (commonly known as Coronavirus).  This is because you are suspected to have the virus or are known to have the virus.  If you are unsure which group you fall into call your clinic.    As part of this program, you'll answer a daily questionnaire in the MyChart mobile app. You'll receive a notification through the MyChart app when the questionnaire is available. When you log in to MyChart, you'll see the tasks in your To Do activity.       Clinicians will see any answers that are concerning and take appropriate steps.  If at any point you are having a medical emergency, call 911.  If otherwise concerned call your clinic instead of coming into the clinic or hospital.  To keep from spreading the disease you should: Stay home and limit contact with other people as much as possible.  Wash your hands frequently. Cover your coughs and sneezes with a tissue, and throw used tissues in the trash.   Clean and disinfect frequently touched surfaces and objects.    Take care of yourself by: Staying home Resting Drinking fluids Take fever-reducing medications (Tylenol/Acetaminophen and Ibuprofen)  For more information on the disease go to the Centers for Disease Control and Prevention website     You are being prescribed MOLNUPIRAVIR for COVID-19 infection.   Please call the pharmacy or go through the drive through vs going inside if you are picking up the mediation yourself to prevent further spread. If prescribed to a [facility] affiliated pharmacy, a pharmacist will bring the medication out to your car.   ADMINISTRATION INSTRUCTIONS: Take with or without food. Swallow the tablets whole. Don't chew, crush, or break the medications because it might not work as well  For each dose of the medication, you should be taking FOUR tablets at one time, TWICE a day   Finish your full five-day course of Molnupiravir even if  you feel better before you're done. Stopping this medication too early can make it less effective to prevent severe illness related to [place].    Molnupiravir is prescribed for YOU ONLY. Don't share it with others, even if they have similar symptoms as you. This medication might not be right for everyone.   Make sure to take steps to protect yourself and others while you're taking this medication in order to get well soon and to prevent others from getting sick with COVID-19.   **If you are of childbearing potential (any gender) - it is advised to not get pregnant while taking this medication and recommended that condoms are used for male partners the next 3 months after taking the medication out of extreme caution    COMMON SIDE EFFECTS: Diarrhea Nausea  Dizziness    If your COVID-19 symptoms get worse, get medical help right away. Call 911 if you experience symptoms such as worsening cough, trouble breathing, chest pain that doesn't go away, confusion, a hard time staying awake, and pale or blue-colored skin. This medication won't prevent all COVID-19 cases from getting worse.   Can take to lessen severity: Vit C 500mg  twice daily Quercertin 250-500mg  twice daily Zinc 75-100mg  daily Melatonin 3-6 mg at bedtime Vit D3 1000-2000 IU daily Aspirin 81 mg daily with food Optional: Famotidine 20mg  daily Also can add tylenol/ibuprofen as needed for fevers and body aches May add Mucinex or Mucinex DM as needed for cough/congestion  10 Things You Can Do  to Manage Your COVID-19 Symptoms at Home If you have possible or confirmed COVID-19 Stay home except to get medical care. Monitor your symptoms carefully. If your symptoms get worse, call your healthcare provider immediately. Get rest and stay hydrated. If you have a medical appointment, call the healthcare provider ahead of time and tell them that you have or may have COVID-19. For medical emergencies, call 911 and notify the dispatch  personnel that you have or may have COVID-19. Cover your cough and sneezes with a tissue or use the inside of your elbow. Wash your hands often with soap and water for at least 20 seconds or clean your hands with an alcohol-based hand sanitizer that contains at least 60% alcohol. As much as possible, stay in a specific room and away from other people in your home. Also, you should use a separate bathroom, if available. If you need to be around other people in or outside of the home, wear a mask. Avoid sharing personal items with other people in your household, like dishes, towels, and bedding. Clean all surfaces that are touched often, like counters, tabletops, and doorknobs. Use household cleaning sprays or wipes according to the label instructions. [email or web address] 07/04/2020 This information is not intended to replace advice given to you by your health care provider. Make sure you discuss any questions you have with your health care provider. Document Revised: 08/28/2021 Document Reviewed: 08/28/2021 Elsevier Patient Education  [place].

## 2021-11-06 NOTE — Telephone Encounter (Signed)
I would recommend to do a virtual visit on demand as it is appropriate for conversation about benefit and risk before prescribing antiviral.

## 2021-11-06 NOTE — Telephone Encounter (Signed)
See below

## 2021-11-06 NOTE — Telephone Encounter (Signed)
Patient calling in  headaches..low grade fever..cough..covid+ 11.18.22 (at home test)  Has VV scheduled for Monday w/ [facility] @ 8:20  Would like to start antiviral today  [id number] - [place], [place] - [address] [place] [address]  Phone:  [phone] Fax:  [phone]

## 2021-11-06 NOTE — Progress Notes (Signed)
Virtual Visit Consent   [name], you are scheduled for a virtual visit with a [facility] provider today.     Just as with appointments in the office, your consent must be obtained to participate.  Your consent will be active for this visit and any virtual visit you may have with one of our providers in the next 365 days.     If you have a MyChart account, a copy of this consent can be sent to you electronically.  All virtual visits are billed to your insurance company just like a traditional visit in the office.    As this is a virtual visit, video technology does not allow for your provider to perform a traditional examination.  This may limit your provider's ability to fully assess your condition.  If your provider identifies any concerns that need to be evaluated in person or the need to arrange testing (such as labs, EKG, etc.), we will make arrangements to do so.     Although advances in technology are sophisticated, we cannot ensure that it will always work on either your end or our end.  If the connection with a video visit is poor, the visit may have to be switched to a telephone visit.  With either a video or telephone visit, we are not always able to ensure that we have a secure connection.     I need to obtain your verbal consent now.   Are you willing to proceed with your visit today?    Shooter Tangen Andreason has provided verbal consent on 11/06/2021 for a virtual visit (video or telephone).   Mar Daring, PA-C   Date: 11/06/2021 4:37 PM   Virtual Visit via Video Note   I, Mar Daring, connected with  Christian Dodson  ([id number], 01/01/1946) on 11/06/21 at  4:45 PM EST by a video-enabled telemedicine application and verified that I am speaking with the correct person using two identifiers.  Location: Patient: Home Provider: Virtual Visit Location Provider: Home Office   I discussed the limitations of evaluation and management by telemedicine and the  availability of in person appointments. The patient expressed understanding and agreed to proceed.    History of Present Illness: Christian Dodson is a 75 y.o. who identifies as a male who was assigned male at birth, and is being seen today for Covid 31.  HPI: URI  This is a new problem. The current episode started today (Symptoms started Wednesday evening and he tested positive today for covid 19). The problem has been gradually worsening. The maximum temperature recorded prior to his arrival was 100.4 - 100.9 F. Associated symptoms include congestion, coughing, headaches, a plugged ear sensation, rhinorrhea, sinus pain and a sore throat (scratchy throat yesterday). Pertinent negatives include no diarrhea, ear pain, nausea or vomiting. Associated symptoms comments: Chills, body aches. He has tried acetaminophen (cough drops, mucinex) for the symptoms. The treatment provided mild relief.     Problems:  Patient Active Problem List   Diagnosis Date Noted   Prediabetes 09/19/2020   Routine general medical examination at a health care facility 03/26/2015   HTN (hypertension) 10/19/2013   GERD (gastroesophageal reflux disease) 10/19/2013   BPH (benign prostatic hyperplasia) 10/19/2013    Allergies: No Known Allergies Medications:  Current Outpatient Medications:    benzonatate (TESSALON) 100 MG capsule, Take 1 capsule (100 mg total) by mouth 3 (three) times daily as needed., Disp: 30 capsule, Rfl: 0   molnupiravir EUA (LAGEVRIO)  200 mg CAPS capsule, Take 4 capsules (800 mg total) by mouth 2 (two) times daily for 5 days., Disp: 40 capsule, Rfl: 0   amoxicillin (AMOXIL) 500 MG capsule, amoxicillin 500 mg capsule  TAKE 4 CAPSULES BY MOUTH 1 HOUR BEFORE DENTAL WORK, Disp: , Rfl:    Cetirizine HCl (ZYRTEC ALLERGY) 10 MG CAPS, Zyrtec 10 mg capsule, Disp: , Rfl:    cholecalciferol (VITAMIN D3) 25 MCG (1000 UNIT) tablet, Take 1,000 Units by mouth daily., Disp: , Rfl:    finasteride (PROSCAR) 5 MG tablet,  TAKE 1 TABLET DAILY, Disp: 90 tablet, Rfl: 2   GLUCOSAMINE-CHONDROITIN PO, Take 1 tablet by mouth 2 (two) times daily. , Disp: , Rfl:    hydrochlorothiazide (HYDRODIURIL) 25 MG tablet, TAKE 1 TABLET DAILY, Disp: 90 tablet, Rfl: 3   lisinopril (ZESTRIL) 40 MG tablet, TAKE 1 TABLET DAILY, Disp: 90 tablet, Rfl: 3   omeprazole (PRILOSEC) 20 MG capsule, Take 20 mg by mouth daily., Disp: , Rfl:    simvastatin (ZOCOR) 20 MG tablet, Take 1 tablet (20 mg total) by mouth at bedtime., Disp: 90 tablet, Rfl: 3   zinc gluconate 50 MG tablet, Take 50 mg by mouth daily., Disp: , Rfl:   Observations/Objective: Patient is well-developed, well-nourished in no acute distress.  Resting comfortably at home.  Head is normocephalic, atraumatic.  No labored breathing.  Speech is clear and coherent with logical content.  Patient is alert and oriented at baseline.    Assessment and Plan: 1. COVID-19 - MyChart COVID-19 home monitoring program; Future - molnupiravir EUA (LAGEVRIO) 200 mg CAPS capsule; Take 4 capsules (800 mg total) by mouth 2 (two) times daily for 5 days.  Dispense: 40 capsule; Refill: 0 - benzonatate (TESSALON) 100 MG capsule; Take 1 capsule (100 mg total) by mouth 3 (three) times daily as needed.  Dispense: 30 capsule; Refill: 0  - Continue OTC symptomatic management of choice - Will send OTC vitamins and supplement information through AVS - Molnupiravir prescribed - Tessalon perles for cough - Patient enrolled in MyChart symptom monitoring - Push fluids - Rest as needed - Discussed return precautions and when to seek in-person evaluation, sent via AVS as well  Follow Up Instructions: I discussed the assessment and treatment plan with the patient. The patient was provided an opportunity to ask questions and all were answered. The patient agreed with the plan and demonstrated an understanding of the instructions.  A copy of instructions were sent to the patient via MyChart unless otherwise  noted below.    The patient was advised to call back or seek an in-person evaluation if the symptoms worsen or if the condition fails to improve as anticipated.  Time:  I spent 13 minutes with the patient via telehealth technology discussing the above problems/concerns.    Mar Daring, PA-C

## 2021-11-06 NOTE — Telephone Encounter (Signed)
Called pt. LVM with recommendations from Dr. Sharlet Salina. Mychart message sent as well

## 2021-11-06 NOTE — Telephone Encounter (Signed)
Informed the patient of the virtual visit options so that he doesn't need to wait until Monday. Patient understands

## 2021-11-09 ENCOUNTER — Telehealth (INDEPENDENT_AMBULATORY_CARE_PROVIDER_SITE_OTHER): Payer: Medicare HMO | Admitting: Nurse Practitioner

## 2021-11-09 ENCOUNTER — Other Ambulatory Visit: Payer: Self-pay

## 2021-11-09 VITALS — HR 89 | Temp 98.1°F

## 2021-11-09 DIAGNOSIS — U071 COVID-19: Secondary | ICD-10-CM | POA: Diagnosis not present

## 2021-11-09 NOTE — Progress Notes (Signed)
Patient ID: Christian Dodson, male    DOB: 14-May-1946, 75 y.o.   MRN: [id number]  Virtual visit completed through [employer], a video enabled telemedicine application. Due to national recommendations of social distancing due to COVID-19, a virtual visit is felt to be most appropriate for this patient at this time. Reviewed limitations, risks, security and privacy concerns of performing a virtual visit and the availability of in person appointments. I also reviewed that there may be a patient responsible charge related to this service. The patient agreed to proceed.   Patient location: home Provider location: [place] [facility], office Persons participating in this virtual visit: patient, provider   If any vitals were documented, they were collected by patient at home unless specified below.    Pulse 89   Temp 98.1 F (36.7 C) Comment: per patient  SpO2 97%    CC: Covid 19 Subjective:   HPI: Christian Dodson is a 75 y.o. male presenting on 11/09/2021 for Covid Positive (On 11/06/21-started on antiviral medication that day, sx started 11/04/21-slight cough-post nasal drip, had a fever before-but broke last night, had sore throat and runny nose, but for the most part this has resolved.)    Symptoms 11/04/2021 At home test positive on 11/06/2021 Pfizer vaccines and 1 booster Has been on molnupiravir since 11/06/2021      Relevant past medical, surgical, family and social history reviewed and updated as indicated. Interim medical history since our last visit reviewed. Allergies and medications reviewed and updated. Outpatient Medications Prior to Visit  Medication Sig Dispense Refill   amoxicillin (AMOXIL) 500 MG capsule amoxicillin 500 mg capsule  TAKE 4 CAPSULES BY MOUTH 1 HOUR BEFORE DENTAL WORK     benzonatate (TESSALON) 100 MG capsule Take 1 capsule (100 mg total) by mouth 3 (three) times daily as needed. 30 capsule 0   Cetirizine HCl (ZYRTEC ALLERGY) 10 MG CAPS Zyrtec 10 mg  capsule     cholecalciferol (VITAMIN D3) 25 MCG (1000 UNIT) tablet Take 1,000 Units by mouth daily.     finasteride (PROSCAR) 5 MG tablet TAKE 1 TABLET DAILY 90 tablet 2   GLUCOSAMINE-CHONDROITIN PO Take 1 tablet by mouth 2 (two) times daily.      hydrochlorothiazide (HYDRODIURIL) 25 MG tablet TAKE 1 TABLET DAILY 90 tablet 3   lisinopril (ZESTRIL) 40 MG tablet TAKE 1 TABLET DAILY 90 tablet 3   molnupiravir EUA (LAGEVRIO) 200 mg CAPS capsule Take 4 capsules (800 mg total) by mouth 2 (two) times daily for 5 days. 40 capsule 0   omeprazole (PRILOSEC) 20 MG capsule Take 20 mg by mouth daily.     simvastatin (ZOCOR) 20 MG tablet Take 1 tablet (20 mg total) by mouth at bedtime. 90 tablet 3   zinc gluconate 50 MG tablet Take 50 mg by mouth daily.     fluorouracil (EFUDEX) 5 % cream Apply 1 application topically 2 (two) times daily. (Patient not taking: Reported on 11/09/2021)     Sodium Sulfate-Mag Sulfate-KCl (SUTAB) [phone] MG TABS Sutab 1.479-0.188-0.225 gram tablet  TAKE AS DIRECTED (Patient not taking: Reported on 11/09/2021)     No facility-administered medications prior to visit.     Per HPI unless specifically indicated in ROS section below Review of Systems  Constitutional:  Negative for chills, fatigue (resolved) and fever.  HENT:  Positive for congestion, postnasal drip and voice change (hoarse). Negative for sore throat.   Respiratory:  Positive for cough. Negative for shortness of breath.  Cardiovascular:  Negative for chest pain.  Gastrointestinal:  Positive for vomiting (dry heaves form sinus drainage. Has resolved). Negative for diarrhea and nausea.  Musculoskeletal:  Negative for arthralgias and myalgias.  Objective:  Pulse 89   Temp 98.1 F (36.7 C) Comment: per patient  SpO2 97%   Wt Readings from Last 3 Encounters:  07/15/21 179 lb (81.2 kg)  07/01/21 179 lb (81.2 kg)  05/13/21 181 lb 6.4 oz (82.3 kg)       Physical exam: Gen: alert, NAD, not ill  appearing Pulm: speaks in complete sentences without increased work of breathing Psych: normal mood, normal thought content      Results for orders placed or performed in visit on 04/27/21  Hemoglobin A1c  Result Value Ref Range   Hgb A1c MFr Bld 7.1 (H) 4.6 - 6.5 %  Lipid panel  Result Value Ref Range   Cholesterol 155 0 - 200 mg/dL   Triglycerides 277.0 (H) 0.0 - 149.0 mg/dL   HDL 33.10 (L) >39.00 mg/dL   VLDL 55.4 (H) 0.0 - 40.0 mg/dL   Total CHOL/HDL Ratio 5    NonHDL 121.42   Comprehensive metabolic panel  Result Value Ref Range   Sodium 139 135 - 145 mEq/L   Potassium 4.0 3.5 - 5.1 mEq/L   Chloride 103 96 - 112 mEq/L   CO2 26 19 - 32 mEq/L   Glucose, Bld 175 (H) 70 - 99 mg/dL   BUN 15 6 - 23 mg/dL   Creatinine, Ser 1.07 0.40 - 1.50 mg/dL   Total Bilirubin 0.6 0.2 - 1.2 mg/dL   Alkaline Phosphatase 75 39 - 117 U/L   AST 23 0 - 37 U/L   ALT 34 0 - 53 U/L   Total Protein 7.0 6.0 - 8.3 g/dL   Albumin 4.4 3.5 - 5.2 g/dL   GFR 69.19 >60.00 mL/min   Calcium 9.8 8.4 - 10.5 mg/dL  CBC  Result Value Ref Range   WBC 7.5 4.0 - 10.5 K/uL   RBC 4.90 4.22 - 5.81 Mil/uL   Platelets 209.0 150.0 - 400.0 K/uL   Hemoglobin 15.8 13.0 - 17.0 g/dL   HCT 44.5 39.0 - 52.0 %   MCV 90.8 78.0 - 100.0 fl   MCHC 35.4 30.0 - 36.0 g/dL   RDW 13.3 11.5 - 15.5 %  PSA  Result Value Ref Range   PSA 1.68 0.10 - 4.00 ng/mL  LDL cholesterol, direct  Result Value Ref Range   Direct LDL 85.0 mg/dL   Assessment & Plan:   Problem List Items Addressed This Visit       Other   COVID-19 - Primary    Patient was diagnosed with COVID-19 and placed on appropriate antiviral treatment.  Patient is 3 to 5 days into treatment and doing well with no adverse drug events reported per patient.  Did review CDC guidelines of quarantine and post quarantine recommendations.  Patient acknowledged.  Continue taking antiviral to completion continue monitoring symptoms.  Signs and symptoms reviewed as when he needs  to seek urgent or emergent health care.  Patient acknowledged        No orders of the defined types were placed in this encounter.  No orders of the defined types were placed in this encounter.   I discussed the assessment and treatment plan with the patient. The patient was provided an opportunity to ask questions and all were answered. The patient agreed with the plan and demonstrated an understanding of the  instructions. The patient was advised to call back or seek an in-person evaluation if the symptoms worsen or if the condition fails to improve as anticipated.  Follow up plan: No follow-ups on file.  Romilda Garret, NP

## 2021-11-09 NOTE — Assessment & Plan Note (Signed)
Patient was diagnosed with COVID-19 and placed on appropriate antiviral treatment.  Patient is 3 to 5 days into treatment and doing well with no adverse drug events reported per patient.  Did review CDC guidelines of quarantine and post quarantine recommendations.  Patient acknowledged.  Continue taking antiviral to completion continue monitoring symptoms.  Signs and symptoms reviewed as when he needs to seek urgent or emergent health care.  Patient acknowledged

## 2021-11-13 ENCOUNTER — Other Ambulatory Visit: Payer: Self-pay | Admitting: Internal Medicine

## 2021-12-09 ENCOUNTER — Encounter: Payer: Self-pay | Admitting: Internal Medicine

## 2021-12-09 ENCOUNTER — Other Ambulatory Visit: Payer: Self-pay

## 2021-12-09 ENCOUNTER — Ambulatory Visit (INDEPENDENT_AMBULATORY_CARE_PROVIDER_SITE_OTHER): Payer: Medicare HMO | Admitting: Internal Medicine

## 2021-12-09 VITALS — BP 126/80 | HR 94 | Resp 18 | Ht 66.0 in | Wt 171.0 lb

## 2021-12-09 DIAGNOSIS — R7303 Prediabetes: Secondary | ICD-10-CM

## 2021-12-09 LAB — POCT GLYCOSYLATED HEMOGLOBIN (HGB A1C): Hemoglobin A1C: 6.2 % — AB (ref 4.0–5.6)

## 2021-12-09 NOTE — Progress Notes (Signed)
° °  Subjective:   Patient ID: Christian Dodson, male    DOB: 05-24-1946, 75 y.o.   MRN: [id number]  HPI The patient is a 75 YO man coming in for follow up sugars. Has changed diet and lost 15 pounds.   Review of Systems  Constitutional: Negative.   HENT: Negative.    Eyes: Negative.   Respiratory:  Negative for cough, chest tightness and shortness of breath.   Cardiovascular:  Negative for chest pain, palpitations and leg swelling.  Gastrointestinal:  Negative for abdominal distention, abdominal pain, constipation, diarrhea, nausea and vomiting.  Musculoskeletal: Negative.   Skin: Negative.   Neurological: Negative.   Psychiatric/Behavioral: Negative.     Objective:  Physical Exam Constitutional:      Appearance: He is well-developed.  HENT:     Head: Normocephalic and atraumatic.  Cardiovascular:     Rate and Rhythm: Normal rate and regular rhythm.  Pulmonary:     Effort: Pulmonary effort is normal. No respiratory distress.     Breath sounds: Normal breath sounds. No wheezing or rales.  Abdominal:     General: Bowel sounds are normal. There is no distension.     Palpations: Abdomen is soft.     Tenderness: There is no abdominal tenderness. There is no rebound.  Musculoskeletal:     Cervical back: Normal range of motion.  Skin:    General: Skin is warm and dry.  Neurological:     Mental Status: He is alert and oriented to person, place, and time.     Coordination: Coordination normal.    Vitals:   12/09/21 0923  BP: 126/80  Pulse: 94  Resp: 18  SpO2: 96%  Weight: 171 lb (77.6 kg)  Height: 5\' 6"  (1.676 m)    This visit occurred during the SARS-CoV-2 public health emergency.  Safety protocols were in place, including screening questions prior to the visit, additional usage of staff PPE, and extensive cleaning of exam room while observing appropriate contact time as indicated for disinfecting solutions.   Assessment & Plan:

## 2021-12-09 NOTE — Assessment & Plan Note (Signed)
POC HgA1c done today at 6.2. Encouraged to keep up with dietary changes. Will recheck in 6 months.

## 2021-12-09 NOTE — Patient Instructions (Signed)
You are doing great with the diet so keep going.

## 2021-12-10 ENCOUNTER — Ambulatory Visit: Payer: Medicare HMO | Admitting: Internal Medicine

## 2021-12-19 ENCOUNTER — Emergency Department (HOSPITAL_BASED_OUTPATIENT_CLINIC_OR_DEPARTMENT_OTHER): Payer: Medicare HMO

## 2021-12-19 ENCOUNTER — Other Ambulatory Visit: Payer: Self-pay

## 2021-12-19 ENCOUNTER — Encounter (HOSPITAL_BASED_OUTPATIENT_CLINIC_OR_DEPARTMENT_OTHER): Payer: Self-pay

## 2021-12-19 ENCOUNTER — Inpatient Hospital Stay (HOSPITAL_BASED_OUTPATIENT_CLINIC_OR_DEPARTMENT_OTHER)
Admission: EM | Admit: 2021-12-19 | Discharge: 2021-12-22 | DRG: 418 | Disposition: A | Discharge status: Home / Self Care | Payer: Medicare HMO | Attending: Internal Medicine | Admitting: Internal Medicine

## 2021-12-19 DIAGNOSIS — Z8616 Personal history of COVID-19: Secondary | ICD-10-CM

## 2021-12-19 DIAGNOSIS — Z79899 Other long term (current) drug therapy: Secondary | ICD-10-CM | POA: Diagnosis not present

## 2021-12-19 DIAGNOSIS — K838 Other specified diseases of biliary tract: Secondary | ICD-10-CM | POA: Diagnosis present

## 2021-12-19 DIAGNOSIS — R7989 Other specified abnormal findings of blood chemistry: Secondary | ICD-10-CM | POA: Diagnosis not present

## 2021-12-19 DIAGNOSIS — N401 Enlarged prostate with lower urinary tract symptoms: Secondary | ICD-10-CM | POA: Diagnosis not present

## 2021-12-19 DIAGNOSIS — K8309 Other cholangitis: Secondary | ICD-10-CM | POA: Diagnosis present

## 2021-12-19 DIAGNOSIS — Z20822 Contact with and (suspected) exposure to covid-19: Secondary | ICD-10-CM | POA: Diagnosis present

## 2021-12-19 DIAGNOSIS — I10 Essential (primary) hypertension: Secondary | ICD-10-CM | POA: Diagnosis present

## 2021-12-19 DIAGNOSIS — K5710 Diverticulosis of small intestine without perforation or abscess without bleeding: Secondary | ICD-10-CM | POA: Diagnosis present

## 2021-12-19 DIAGNOSIS — R933 Abnormal findings on diagnostic imaging of other parts of digestive tract: Secondary | ICD-10-CM

## 2021-12-19 DIAGNOSIS — Z801 Family history of malignant neoplasm of trachea, bronchus and lung: Secondary | ICD-10-CM | POA: Diagnosis not present

## 2021-12-19 DIAGNOSIS — Z823 Family history of stroke: Secondary | ICD-10-CM

## 2021-12-19 DIAGNOSIS — R350 Frequency of micturition: Secondary | ICD-10-CM

## 2021-12-19 DIAGNOSIS — R1013 Epigastric pain: Secondary | ICD-10-CM | POA: Diagnosis not present

## 2021-12-19 DIAGNOSIS — Z8711 Personal history of peptic ulcer disease: Secondary | ICD-10-CM | POA: Diagnosis not present

## 2021-12-19 DIAGNOSIS — K760 Fatty (change of) liver, not elsewhere classified: Secondary | ICD-10-CM | POA: Diagnosis present

## 2021-12-19 DIAGNOSIS — R519 Headache, unspecified: Secondary | ICD-10-CM | POA: Diagnosis present

## 2021-12-19 DIAGNOSIS — Z86010 Personal history of colonic polyps: Secondary | ICD-10-CM

## 2021-12-19 DIAGNOSIS — M1990 Unspecified osteoarthritis, unspecified site: Secondary | ICD-10-CM | POA: Diagnosis present

## 2021-12-19 DIAGNOSIS — K8065 Calculus of gallbladder and bile duct with chronic cholecystitis with obstruction: Secondary | ICD-10-CM | POA: Diagnosis present

## 2021-12-19 DIAGNOSIS — Z83438 Family history of other disorder of lipoprotein metabolism and other lipidemia: Secondary | ICD-10-CM

## 2021-12-19 DIAGNOSIS — R7401 Elevation of levels of liver transaminase levels: Secondary | ICD-10-CM | POA: Diagnosis present

## 2021-12-19 DIAGNOSIS — N400 Enlarged prostate without lower urinary tract symptoms: Secondary | ICD-10-CM | POA: Diagnosis present

## 2021-12-19 DIAGNOSIS — K8050 Calculus of bile duct without cholangitis or cholecystitis without obstruction: Secondary | ICD-10-CM

## 2021-12-19 DIAGNOSIS — R109 Unspecified abdominal pain: Secondary | ICD-10-CM

## 2021-12-19 DIAGNOSIS — Z8249 Family history of ischemic heart disease and other diseases of the circulatory system: Secondary | ICD-10-CM

## 2021-12-19 DIAGNOSIS — Z96642 Presence of left artificial hip joint: Secondary | ICD-10-CM | POA: Diagnosis present

## 2021-12-19 DIAGNOSIS — K219 Gastro-esophageal reflux disease without esophagitis: Secondary | ICD-10-CM | POA: Diagnosis present

## 2021-12-19 DIAGNOSIS — Z96651 Presence of right artificial knee joint: Secondary | ICD-10-CM | POA: Diagnosis present

## 2021-12-19 DIAGNOSIS — K8020 Calculus of gallbladder without cholecystitis without obstruction: Secondary | ICD-10-CM

## 2021-12-19 DIAGNOSIS — R7303 Prediabetes: Secondary | ICD-10-CM | POA: Diagnosis present

## 2021-12-19 DIAGNOSIS — Z85828 Personal history of other malignant neoplasm of skin: Secondary | ICD-10-CM

## 2021-12-19 DIAGNOSIS — E119 Type 2 diabetes mellitus without complications: Secondary | ICD-10-CM | POA: Diagnosis present

## 2021-12-19 DIAGNOSIS — Z87440 Personal history of urinary (tract) infections: Secondary | ICD-10-CM

## 2021-12-19 DIAGNOSIS — K8051 Calculus of bile duct without cholangitis or cholecystitis with obstruction: Secondary | ICD-10-CM | POA: Diagnosis not present

## 2021-12-19 DIAGNOSIS — K8031 Calculus of bile duct with cholangitis, unspecified, with obstruction: Secondary | ICD-10-CM | POA: Diagnosis not present

## 2021-12-19 DIAGNOSIS — Z8371 Family history of colonic polyps: Secondary | ICD-10-CM | POA: Diagnosis not present

## 2021-12-19 LAB — URINALYSIS, ROUTINE W REFLEX MICROSCOPIC
Glucose, UA: 100 mg/dL — AB
Hgb urine dipstick: NEGATIVE
Ketones, ur: NEGATIVE mg/dL
Leukocytes,Ua: NEGATIVE
Nitrite: NEGATIVE
Protein, ur: NEGATIVE mg/dL
Specific Gravity, Urine: 1.009 (ref 1.005–1.030)
pH: 5.5 (ref 5.0–8.0)

## 2021-12-19 LAB — COMPREHENSIVE METABOLIC PANEL
ALT: 448 U/L — ABNORMAL HIGH (ref 0–44)
AST: 193 U/L — ABNORMAL HIGH (ref 15–41)
Albumin: 4.6 g/dL (ref 3.5–5.0)
Alkaline Phosphatase: 205 U/L — ABNORMAL HIGH (ref 38–126)
Anion gap: 12 (ref 5–15)
BUN: 16 mg/dL (ref 8–23)
CO2: 27 mmol/L (ref 22–32)
Calcium: 10.3 mg/dL (ref 8.9–10.3)
Chloride: 96 mmol/L — ABNORMAL LOW (ref 98–111)
Creatinine, Ser: 1.08 mg/dL (ref 0.61–1.24)
GFR, Estimated: >60 mL/min (ref >60)
Glucose, Bld: 154 mg/dL — ABNORMAL HIGH (ref 70–99)
Potassium: 3.3 mmol/L — ABNORMAL LOW (ref 3.5–5.1)
Sodium: 135 mmol/L (ref 135–145)
Total Bilirubin: 5 mg/dL — ABNORMAL HIGH (ref 0.3–1.2)
Total Protein: 7.9 g/dL (ref 6.5–8.1)

## 2021-12-19 LAB — RESP PANEL BY RT-PCR (FLU A&B, COVID) ARPGX2
Influenza A by PCR: NEGATIVE
Influenza B by PCR: NEGATIVE
SARS Coronavirus 2 by RT PCR: NEGATIVE

## 2021-12-19 LAB — CBC WITH DIFFERENTIAL/PLATELET
Abs Immature Granulocytes: 0.06 10*3/uL (ref 0.00–0.07)
Basophils Absolute: 0 10*3/uL (ref 0.0–0.1)
Basophils Relative: 0 %
Eosinophils Absolute: 0.1 10*3/uL (ref 0.0–0.5)
Eosinophils Relative: 1 %
HCT: 42.3 % (ref 39.0–52.0)
Hemoglobin: 14.8 g/dL (ref 13.0–17.0)
Immature Granulocytes: 0 %
Lymphocytes Relative: 12 %
Lymphs Abs: 1.7 10*3/uL (ref 0.7–4.0)
MCH: 31.9 pg (ref 26.0–34.0)
MCHC: 35 g/dL (ref 30.0–36.0)
MCV: 91.2 fL (ref 80.0–100.0)
Monocytes Absolute: 0.9 10*3/uL (ref 0.1–1.0)
Monocytes Relative: 6 %
Neutro Abs: 11.1 10*3/uL — ABNORMAL HIGH (ref 1.7–7.7)
Neutrophils Relative %: 81 %
Platelets: 218 10*3/uL (ref 150–400)
RBC: 4.64 MIL/uL (ref 4.22–5.81)
RDW: 13.8 % (ref 11.5–15.5)
WBC: 13.8 10*3/uL — ABNORMAL HIGH (ref 4.0–10.5)
nRBC: 0 % (ref 0.0–0.2)

## 2021-12-19 LAB — LIPASE, BLOOD: Lipase: 20 U/L (ref 11–51)

## 2021-12-19 LAB — LACTIC ACID, PLASMA
Lactic Acid, Venous: 1.5 mmol/L (ref 0.5–1.9)
Lactic Acid, Venous: 1 mmol/L (ref 0.5–1.9)

## 2021-12-19 MED ORDER — SODIUM CHLORIDE 0.9 % IV BOLUS
1000.0000 mL | Freq: Once | INTRAVENOUS | Status: AC
  Administered 2021-12-19: 1000 mL via INTRAVENOUS

## 2021-12-19 MED ORDER — ACETAMINOPHEN 325 MG PO TABS
650.0000 mg | ORAL_TABLET | Freq: Four times a day (QID) | ORAL | Status: DC | PRN
  Administered 2021-12-19 – 2021-12-22 (×5): 650 mg via ORAL
  Filled 2021-12-19 (×5): qty 2

## 2021-12-19 MED ORDER — SODIUM CHLORIDE 0.9 % IV SOLN
3.0000 g | Freq: Three times a day (TID) | INTRAVENOUS | Status: DC
  Administered 2021-12-20 – 2021-12-21 (×5): 3 g via INTRAVENOUS
  Filled 2021-12-19 (×6): qty 8

## 2021-12-19 MED ORDER — METRONIDAZOLE 500 MG/100ML IV SOLN
500.0000 mg | Freq: Once | INTRAVENOUS | Status: AC
  Administered 2021-12-19: 500 mg via INTRAVENOUS
  Filled 2021-12-19: qty 100

## 2021-12-19 MED ORDER — SODIUM CHLORIDE 0.9 % IV SOLN: INTRAVENOUS | Status: AC

## 2021-12-19 MED ORDER — ONDANSETRON HCL 4 MG PO TABS: 4.0000 mg | ORAL_TABLET | Freq: Four times a day (QID) | ORAL | Status: DC | PRN

## 2021-12-19 MED ORDER — HYDROCHLOROTHIAZIDE 25 MG PO TABS
25.0000 mg | ORAL_TABLET | Freq: Every day | ORAL | Status: DC
  Administered 2021-12-21 – 2021-12-22 (×2): 25 mg via ORAL
  Filled 2021-12-19 (×2): qty 1

## 2021-12-19 MED ORDER — PANTOPRAZOLE SODIUM 40 MG PO TBEC
40.0000 mg | DELAYED_RELEASE_TABLET | Freq: Every day | ORAL | Status: DC
  Administered 2021-12-21 – 2021-12-22 (×2): 40 mg via ORAL
  Filled 2021-12-19 (×2): qty 1

## 2021-12-19 MED ORDER — HEPARIN SODIUM (PORCINE) 5000 UNIT/ML IJ SOLN
5000.0000 [IU] | Freq: Three times a day (TID) | INTRAMUSCULAR | Status: DC
  Administered 2021-12-19 – 2021-12-22 (×6): 5000 [IU] via SUBCUTANEOUS
  Filled 2021-12-19 (×7): qty 1

## 2021-12-19 MED ORDER — IOHEXOL 300 MG/ML  SOLN
100.0000 mL | Freq: Once | INTRAMUSCULAR | Status: AC | PRN
  Administered 2021-12-19: 75 mL via INTRAVENOUS

## 2021-12-19 MED ORDER — POTASSIUM CHLORIDE CRYS ER 20 MEQ PO TBCR
40.0000 meq | EXTENDED_RELEASE_TABLET | ORAL | Status: AC
  Administered 2021-12-19 – 2021-12-20 (×2): 40 meq via ORAL
  Filled 2021-12-19 (×2): qty 2

## 2021-12-19 MED ORDER — ACETAMINOPHEN 650 MG RE SUPP: 650.0000 mg | Freq: Four times a day (QID) | RECTAL | Status: DC | PRN

## 2021-12-19 MED ORDER — ONDANSETRON HCL 4 MG/2ML IJ SOLN: 4.0000 mg | Freq: Four times a day (QID) | INTRAMUSCULAR | Status: DC | PRN

## 2021-12-19 MED ORDER — LISINOPRIL 20 MG PO TABS
40.0000 mg | ORAL_TABLET | Freq: Every day | ORAL | Status: DC
  Administered 2021-12-21 – 2021-12-22 (×2): 40 mg via ORAL
  Filled 2021-12-19 (×2): qty 2

## 2021-12-19 MED ORDER — FINASTERIDE 5 MG PO TABS
5.0000 mg | ORAL_TABLET | Freq: Every day | ORAL | Status: DC
  Administered 2021-12-19 – 2021-12-22 (×3): 5 mg via ORAL
  Filled 2021-12-19 (×3): qty 1

## 2021-12-19 MED ORDER — SODIUM CHLORIDE 0.9 % IV SOLN
2.0000 g | Freq: Once | INTRAVENOUS | Status: AC
  Administered 2021-12-19: 2 g via INTRAVENOUS
  Filled 2021-12-19: qty 20

## 2021-12-19 MED ORDER — MORPHINE SULFATE (PF) 2 MG/ML IV SOLN: 2.0000 mg | INTRAVENOUS | Status: DC | PRN

## 2021-12-19 NOTE — ED Triage Notes (Signed)
He c/o sinus pain and pressure. He tells me that yesterday he had some "abdominal cramping, but it went away". He is ambulatory and in no distress.

## 2021-12-19 NOTE — Plan of Care (Signed)
[facility] Plan of care  Called for direct admit by Dr Tyrone Nine.   75 y/o male presenting for epigastric and abdominal discomfort and is found to have elevated LFTs and CBD dilatation on US abdomen. GI requested [facility] to admit and obtain MRCP. He has complained of sinus issues and fevers at home. CT face is negative. He has been given Ceftriaxone and Flagyl in the ED.  I have accepted him to a med surg bed.   Debbe Odea, MD

## 2021-12-19 NOTE — ED Provider Notes (Signed)
[facility] EMERGENCY DEPT Provider Note   CSN: [id number] Arrival date & time: 12/19/21  6269     History Chief Complaint  Patient presents with   Facial Pain    Christian Dodson is a 75 y.o. male.  The history is provided by the patient and medical records. No language interpreter was used.  Abdominal Pain Pain location:  RUQ Pain quality: aching   Pain radiates to:  Does not radiate Pain severity:  Moderate Onset quality:  Gradual Duration:  1 day Progression:  Resolved Chronicity:  New Context: not trauma   Relieved by:  Nothing Worsened by:  Nothing Ineffective treatments:  None tried Associated symptoms: cough, dysuria, fatigue and fever   Associated symptoms: no chest pain, no chills, no constipation, no diarrhea, no nausea and no shortness of breath       Past Medical History:  Diagnosis Date   Arthritis    BPH (benign prostatic hyperplasia) 10/19/2013   GERD (gastroesophageal reflux disease)    History of stomach ulcers    age 40   Hypertension    Pneumonia    x4   Pre-diabetes    Skin cancer     Patient Active Problem List   Diagnosis Date Noted   COVID-19 11/09/2021   Pre-diabetes 09/19/2020   Routine general medical examination at a health care facility 03/26/2015   HTN (hypertension) 10/19/2013   GERD (gastroesophageal reflux disease) 10/19/2013   BPH (benign prostatic hyperplasia) 10/19/2013    Past Surgical History:  Procedure Laterality Date   APPENDECTOMY     COLONOSCOPY  2010   [place]=normal exam per pt   INGUINAL HERNIA REPAIR Right    left shoulder rotator and bicep  01/10/2017   MEDIAL PARTIAL KNEE REPLACEMENT Right 2013   TONSILLECTOMY AND ADENOIDECTOMY     TOTAL HIP ARTHROPLASTY Left 09/30/2020   Procedure: TOTAL HIP ARTHROPLASTY ANTERIOR APPROACH;  Surgeon: Paralee Cancel, MD;  Location: [facility];  Service: Orthopedics;  Laterality: Left;  70 mins   UPPER GI ENDOSCOPY         Family History  Problem  Relation Age of Onset   Colon polyps Mother    Hypertension Mother    Cancer Father        lung   Hyperlipidemia Maternal Grandfather    Heart disease Maternal Grandfather    Stroke Paternal Grandmother    Heart disease Paternal Grandmother    Hyperlipidemia Paternal Grandfather    Diabetes Neg Hx    Colon cancer Neg Hx    Esophageal cancer Neg Hx    Rectal cancer Neg Hx    Stomach cancer Neg Hx     Social History   Tobacco Use   Smoking status: Never   Smokeless tobacco: Never  Vaping Use   Vaping Use: Never used  Substance Use Topics   Alcohol use: Not Currently    Comment: occassionally- once a month per pt   Drug use: No    Home Medications Prior to Admission medications   Medication Sig Start Date End Date Taking? Authorizing Provider  amoxicillin (AMOXIL) 500 MG capsule amoxicillin 500 mg capsule  TAKE 4 CAPSULES BY MOUTH 1 HOUR BEFORE DENTAL WORK    [provider]  Cetirizine HCl (ZYRTEC ALLERGY) 10 MG CAPS Zyrtec 10 mg capsule    [provider]  cholecalciferol (VITAMIN D3) 25 MCG (1000 UNIT) tablet Take 1,000 Units by mouth daily.    [provider]  finasteride (PROSCAR) 5 MG tablet TAKE  1 TABLET DAILY 04/18/20   Hoyt Koch, MD  fluorouracil (EFUDEX) 5 % cream Apply 1 application topically 2 (two) times daily. Patient not taking: Reported on 11/09/2021 10/26/21   [provider]  GLUCOSAMINE-CHONDROITIN PO Take 1 tablet by mouth 2 (two) times daily.     [provider]  hydrochlorothiazide (HYDRODIURIL) 25 MG tablet Take 1 tablet (25 mg total) by mouth daily. Annual appt due in May must see provider for future refills 11/16/21   Hoyt Koch, MD  lisinopril (ZESTRIL) 40 MG tablet TAKE 1 TABLET DAILY 11/16/21   Hoyt Koch, MD  omeprazole (PRILOSEC) 20 MG capsule Take 20 mg by mouth daily.    [provider]  simvastatin (ZOCOR) 20 MG tablet Take 1 tablet (20 mg total) by mouth at  bedtime. 05/06/21   Hoyt Koch, MD  Sodium Sulfate-Mag Sulfate-KCl (SUTAB) [id number] MG TABS Sutab 1.479-0.188-0.225 gram tablet  TAKE AS DIRECTED Patient not taking: Reported on 11/09/2021    [provider]  zinc gluconate 50 MG tablet Take 50 mg by mouth daily.    [provider]    Allergies    Patient has no known allergies.  Review of Systems   Review of Systems  Constitutional:  Positive for fatigue and fever. Negative for chills.  HENT:  Positive for congestion, sinus pressure and sinus pain.   Respiratory:  Positive for cough. Negative for chest tightness, shortness of breath and wheezing.   Cardiovascular:  Negative for chest pain.  Gastrointestinal:  Positive for abdominal pain. Negative for constipation, diarrhea and nausea.  Genitourinary:  Positive for dysuria. Negative for flank pain and frequency.  Musculoskeletal:  Negative for back pain.  Neurological:  Positive for headaches. Negative for light-headedness.  Psychiatric/Behavioral:  Negative for agitation.   All other systems reviewed and are negative.  Physical Exam Updated Vital Signs BP 139/81    Pulse 93    Temp 98.7 F (37.1 C) (Oral)    Resp 16    SpO2 97%   Physical Exam Vitals and nursing note reviewed.  Constitutional:      General: He is not in acute distress.    Appearance: He is well-developed. He is not ill-appearing, toxic-appearing or diaphoretic.  HENT:     Head: Normocephalic and atraumatic.     Nose: Congestion present. No rhinorrhea.     Mouth/Throat:     Mouth: Mucous membranes are moist.     Pharynx: No oropharyngeal exudate or posterior oropharyngeal erythema.  Eyes:     Extraocular Movements: Extraocular movements intact.     Conjunctiva/sclera: Conjunctivae normal.     Pupils: Pupils are equal, round, and reactive to light.  Cardiovascular:     Rate and Rhythm: Normal rate and regular rhythm.     Heart sounds: No murmur heard. Pulmonary:      Effort: Pulmonary effort is normal. No respiratory distress.     Breath sounds: Normal breath sounds. No wheezing, rhonchi or rales.  Chest:     Chest wall: No tenderness.  Abdominal:     General: Abdomen is flat.     Palpations: Abdomen is soft.     Tenderness: There is no abdominal tenderness. There is no right CVA tenderness, left CVA tenderness, guarding or rebound.  Musculoskeletal:        General: No swelling or tenderness.     Cervical back: Neck supple. No tenderness.  Skin:    General: Skin is warm and dry.  Capillary Refill: Capillary refill takes less than 2 seconds.     Findings: No erythema.  Neurological:     General: No focal deficit present.     Mental Status: He is alert.  Psychiatric:        Mood and Affect: Mood normal.    ED Results / Procedures / Treatments   Labs (all labs ordered are listed, but only abnormal results are displayed) Labs Reviewed  CBC WITH DIFFERENTIAL/PLATELET - Abnormal; Notable for the following components:      Result Value   WBC 13.8 (*)    Neutro Abs 11.1 (*)    All other components within normal limits  COMPREHENSIVE METABOLIC PANEL - Abnormal; Notable for the following components:   Potassium 3.3 (*)    Chloride 96 (*)    Glucose, Bld 154 (*)    AST 193 (*)    ALT 448 (*)    Alkaline Phosphatase 205 (*)    Total Bilirubin 5.0 (*)    All other components within normal limits  URINALYSIS, ROUTINE W REFLEX MICROSCOPIC - Abnormal; Notable for the following components:   Glucose, UA 100 (*)    Bilirubin Urine SMALL (*)    All other components within normal limits  RESP PANEL BY RT-PCR (FLU A&B, COVID) ARPGX2  URINE CULTURE  CULTURE, BLOOD (ROUTINE X 2)  CULTURE, BLOOD (ROUTINE X 2)  LIPASE, BLOOD  LACTIC ACID, PLASMA  LACTIC ACID, PLASMA    EKG None  Radiology CT Maxillofacial W Contrast  Result Date: 12/19/2021 CLINICAL DATA:  Headaches. Intermittent fevers. Concern for recurrent sinus infection. EXAM: CT  MAXILLOFACIAL WITH CONTRAST TECHNIQUE: Multidetector CT imaging of the maxillofacial structures was performed with intravenous contrast. Multiplanar CT image reconstructions were also generated. CONTRAST:  33mL OMNIPAQUE IOHEXOL 300 MG/ML  SOLN COMPARISON:  None. FINDINGS: Osseous: No focal osseous lesions or fracture. No significant dental disease. Orbits: The globes and orbits are within normal limits. Sinuses: The paranasal sinuses and mastoid air cells are clear. Soft tissues: Soft tissues the face are unremarkable. Atherosclerotic changes are present at the carotid bifurcations, right greater than left without definite stenosis. Limited intracranial: Unremarkable. IMPRESSION: Negative CT of the face.  No significant sinus disease. Electronically Signed   By: San Morelle M.D.   On: 12/19/2021 15:15   DG Chest Portable 1 View  Result Date: 12/19/2021 CLINICAL DATA:  75 year old male with history of fevers and cough. EXAM: PORTABLE CHEST 1 VIEW COMPARISON:  No priors. FINDINGS: Lung volumes are normal. Diffuse peribronchial cuffing. No consolidative airspace disease. No pleural effusions. No pneumothorax. No pulmonary nodule or mass noted. Pulmonary vasculature and the cardiomediastinal silhouette are within normal limits. IMPRESSION: 1. Diffuse peribronchial cuffing concerning for an acute bronchitis. Electronically Signed   By: Vinnie Langton M.D.   On: 12/19/2021 14:18   US Abdomen Limited RUQ (LIVER/GB)  Result Date: 12/19/2021 CLINICAL DATA:  75 year old male with history of abdominal pain and nausea. EXAM: ULTRASOUND ABDOMEN LIMITED RIGHT UPPER QUADRANT COMPARISON:  No priors. FINDINGS: Gallbladder: There is amorphous material and some small echogenic foci with posterior acoustic shadowing lying dependently in the gallbladder, compatible with a combination of biliary sludge and tiny gallstones. Gallbladder is only moderately distended. Gallbladder wall thickness is normal at 3.3 mm. No  pericholecystic fluid. Per report from the sonographer, there was no sonographic Murphy's sign on examination. Common bile duct: Diameter: 7.3 mm Liver: Tiny hypoechoic lesion in the left lobe of the liver measuring 1.1 x 0.6 x 0.8 cm,  indeterminate. Generalized increased echogenicity throughout the hepatic parenchyma, suggestive of hepatic steatosis. Portal vein is patent on color Doppler imaging with normal direction of blood flow towards the liver. Other: None. IMPRESSION: 1. Cholelithiasis and biliary sludge in the gallbladder, without evidence of acute cholecystitis at this time. 2. Mild prominence of the common bile duct measuring 7.3 mm in the porta hepatis. If there is clinical concern for choledocholithiasis or other cause of biliary obstruction, further evaluation with abdominal MRI with and without IV gadolinium with MRCP should be considered. 3. Hepatic steatosis. 4. Small indeterminate lesion in the left lobe of the liver. This too could be further evaluated with follow-up abdominal MRI with and without IV gadolinium with MRCP. Electronically Signed   By: Vinnie Langton M.D.   On: 12/19/2021 13:53    Procedures Procedures   Medications Ordered in ED Medications  cefTRIAXone (ROCEPHIN) 2 g in sodium chloride 0.9 % 100 mL IVPB (has no administration in time range)  metroNIDAZOLE (FLAGYL) IVPB 500 mg (has no administration in time range)  sodium chloride 0.9 % bolus 1,000 mL (0 mLs Intravenous Stopped 12/19/21 1506)  iohexol (OMNIPAQUE) 300 MG/ML solution 100 mL (75 mLs Intravenous Contrast Given 12/19/21 1405)    ED Course  I have reviewed the triage vital signs and the nursing notes.  Pertinent labs & imaging results that were available during my care of the patient were reviewed by me and considered in my medical decision making (see chart for details).    MDM Rules/Calculators/A&P                          CHAITANYA AMEDEE is a 75 y.o. male with a past medical history significant  for GERD, hypertension, previous pneumonias, previous sinus infections, and remote history of urinary tract infections who presents with a week and a half of waxing and waning fevers, headache with facial pressure, drainage, intermittent cough, abdominal pain, and dysuria.  According to patient, for the last week and a half he has been having headache that feels like pressure in his sinuses and head.  He is very well accustomed to sinus infections and thinks he is having 1.  He reports the drainage has been persistent and then its cause some congestion and some occasional cough.  He denies any chest pain or shortness of breath but does report that he has been having intermittent abdominal cramping especially yesterday.  He reports he is also had waxing waning episodes of pain with urination and looking darker with less urine.  He denies any constipation or diarrhea and denies any back or flank pains.  He is unsure of any sick contacts but with the waxing waning fevers up to 101 he want to make sure there was no persistent bacterial infection.  On exam, lungs were clear and chest was nontender.  Abdomen was nontender for me but he was describing discomfort in his right upper quadrant and epigastric area.  Patient does have some tenderness in his frontal sinus and maxillary sinuses but other than drainage does not see any evidence of RPA or PTA.  Normal neck range of motion.  Exam otherwise unremarkable.  Had a shared decision made conversation with patient and given his waxing waning fever for the last week and a half we did feel is reasonable to get some imaging and work-up to rule out acute bacterial infections.  With his discomfort and headache and history we will get a CT of  the face to rule out an abscess or other more concerning sinus infection.  We will get chest x-ray given the mild cough and a right upper quadrant ultrasound given the discomfort intermittently in his abdomen with the fevers.  We will  also get urinalysis given urinary changes and some basic labs.  We will give some fluids for likely dehydration and will reassess.  Anticipate reassessment after work-up to determine disposition.  Care transferred to Dr. Tyrone Nine while waiting for work-up to be completed.   Final Clinical Impression(s) / ED Diagnoses Final diagnoses:  Abdominal pain    Clinical Impression: 1. Abdominal pain     Disposition: Care transferred to oncoming team while waiting for results to completed  This note was prepared with assistance of Dragon voice recognition software. Occasional wrong-word or sound-a-like substitutions may have occurred due to the inherent limitations of voice recognition software.     Yancy Hascall, Gwenyth Allegra, MD 12/19/21 [phone]

## 2021-12-19 NOTE — H&P (Signed)
History and Physical    Christian Dodson IRS:[id number] DOB: 26-Dec-1945 DOA: 12/19/2021  PCP: Hoyt Koch, MD   Patient coming from: Home (direct admission from PCP/ED)  I have personally briefly reviewed patient's old medical records in [place]  Chief Complaint: Abdominal pain  HPI: Christian Dodson is a 75 y.o. male with medical history significant of hypertension, prediabetes, history of peptic ulcer disease/GERD and BPH; who presented to the emergency department secondary to abdominal pain, nausea/vomiting and elevated LFTs; patient presented with symptoms approximately 2 days ago; pain was localized in his mid epigastric area, radiated to right upper quadrant, 7 out of 10 in intensity and dull in nature.  Patient reported 1 episode of nausea/vomiting associated with pain and no blood seen.  He reports fever at home and decreased appetite.  No focal weakness, no hematuria, no melena, no hematochezia, no coughing spells or any other complaints.  Patient is vaccinated and boosted against COVID; COVID PCR negative.  ED Course: Abdominal ultrasound has demonstrated hepatic steatosis and CBD dilatation; positive transaminitis.  GI service was consulted and recommended checking MRCP.  TRH was call to place in the hospital for further evaluation and management.  Review of Systems: As per HPI otherwise all other systems reviewed and are negative.  Past Medical History:  Diagnosis Date   Arthritis    BPH (benign prostatic hyperplasia) 10/19/2013   GERD (gastroesophageal reflux disease)    History of stomach ulcers    age 89   Hypertension    Pneumonia    x4   Pre-diabetes    Skin cancer     Past Surgical History:  Procedure Laterality Date   APPENDECTOMY     COLONOSCOPY  2010   [place]=normal exam per pt   INGUINAL HERNIA REPAIR Right    left shoulder rotator and bicep  01/10/2017   MEDIAL PARTIAL KNEE REPLACEMENT Right 2013   TONSILLECTOMY AND ADENOIDECTOMY      TOTAL HIP ARTHROPLASTY Left 09/30/2020   Procedure: TOTAL HIP ARTHROPLASTY ANTERIOR APPROACH;  Surgeon: Paralee Cancel, MD;  Location: [facility];  Service: Orthopedics;  Laterality: Left;  70 mins   UPPER GI ENDOSCOPY      Social History  reports that he has never smoked. He has never used smokeless tobacco. He reports that he does not currently use alcohol. He reports that he does not use drugs.  No Known Allergies  Family History  Problem Relation Age of Onset   Colon polyps Mother    Hypertension Mother    Cancer Father        lung   Hyperlipidemia Maternal Grandfather    Heart disease Maternal Grandfather    Stroke Paternal Grandmother    Heart disease Paternal Grandmother    Hyperlipidemia Paternal Grandfather    Diabetes Neg Hx    Colon cancer Neg Hx    Esophageal cancer Neg Hx    Rectal cancer Neg Hx    Stomach cancer Neg Hx      Prior to Admission medications   Medication Sig Start Date End Date Taking? Authorizing Provider  acetaminophen (TYLENOL) 650 MG CR tablet Take 650 mg by mouth See admin instructions. Take 2 tablets (1300 mg) by mouth daily at bedtime, may also take 2 tablets (1300 mg) two more times daily as needed for pain   Yes [provider]  amoxicillin (AMOXIL) 500 MG capsule Take 2,000 mg by mouth See admin instructions. Take 4 capsules (2000 mg) by mouth one hour  prior to dental appointments   Yes [provider]  Ascorbic Acid (VITAMIN C PO) Take 1 tablet by mouth every morning.   Yes [provider]  cetirizine (ZYRTEC) 10 MG tablet Take 10 mg by mouth every morning.   Yes [provider]  finasteride (PROSCAR) 5 MG tablet TAKE 1 TABLET DAILY Patient taking differently: 5 mg at bedtime. 04/18/20  Yes Hoyt Koch, MD  GLUCOSAMINE-CHONDROITIN PO Take 1 tablet by mouth every morning.   Yes [provider]  hydrochlorothiazide (HYDRODIURIL) 25 MG tablet Take 1 tablet (25 mg total) by mouth daily. Annual  appt due in May must see provider for future refills Patient taking differently: Take 25 mg by mouth every morning. Annual appt due in May must see provider for future refills 11/16/21  Yes Hoyt Koch, MD  lisinopril (ZESTRIL) 40 MG tablet TAKE 1 TABLET DAILY Patient taking differently: Take 40 mg by mouth every morning. 11/16/21  Yes Hoyt Koch, MD  Multiple Vitamins-Minerals (ZINC PO) Take 1 tablet by mouth every morning.   Yes [provider]  omeprazole (PRILOSEC OTC) 20 MG tablet Take 20 mg by mouth every morning.   Yes [provider]  simvastatin (ZOCOR) 20 MG tablet Take 1 tablet (20 mg total) by mouth at bedtime. 05/06/21  Yes Hoyt Koch, MD  VITAMIN D PO Take 1 tablet by mouth every morning.   Yes [provider]  omeprazole (PRILOSEC) 20 MG capsule Take 20 mg by mouth daily. Patient not taking: Reported on 12/19/2021    [provider]    Physical Exam: Vitals:   12/19/21 1415 12/19/21 1440 12/19/21 1615 12/19/21 2030  BP: (!) 152/83 127/71 134/70 (!) 149/90  Pulse: 99 87 84 (!) 104  Resp: 16 18 18 17   Temp:    98.5 F (36.9 C)  TempSrc:    Oral  SpO2: 98% 98% 97% 97%    Constitutional: In no major distress; currently reporting no further pain, nausea or vomiting events.  Patient is afebrile with hemodynamically stable vital signs. Vitals:   12/19/21 1415 12/19/21 1440 12/19/21 1615 12/19/21 2030  BP: (!) 152/83 127/71 134/70 (!) 149/90  Pulse: 99 87 84 (!) 104  Resp: 16 18 18 17   Temp:    98.5 F (36.9 C)  TempSrc:    Oral  SpO2: 98% 98% 97% 97%   Eyes: PERRL, lids and conjunctivae normal, no icterus, no jaundice, no nystagmus. ENMT: Mucous membranes are moist. Posterior pharynx clear of any exudate or lesions. Neck: normal, supple, no masses, no thyromegaly, no JVD. Respiratory: clear to auscultation bilaterally, no wheezing, no crackles. Normal respiratory effort. No accessory muscle use.   Cardiovascular: Regular rate and rhythm, no murmurs / rubs / gallops. No extremity edema. 2+ pedal pulses. No carotid bruits.  Abdomen: no tenderness, no masses palpated. No hepatosplenomegaly. Bowel sounds positive.  Musculoskeletal: no clubbing / cyanosis. No joint deformity upper and lower extremities. Good ROM, no contractures. Normal muscle tone.  Skin: no rashes, lesions, ulcers. No induration Neurologic: CN 2-12 grossly intact. Sensation intact, DTR normal. Strength 5/5 in all 4.  Psychiatric: Normal judgment and insight. Alert and oriented x 3. Normal mood.    Labs on Admission: I have personally reviewed following labs and imaging studies  CBC: Recent Labs  Lab 12/19/21 1308  WBC 13.8*  NEUTROABS 11.1*  HGB 14.8  HCT 42.3  MCV 91.2  PLT 740    Basic Metabolic Panel: Recent Labs  Lab 12/19/21 1308  NA 135  K 3.3*  CL 96*  CO2 27  GLUCOSE 154*  BUN 16  CREATININE 1.08  CALCIUM 10.3    GFR: Estimated Creatinine Clearance: 59.7 mL/min (by C-G formula based on SCr of 1.08 mg/dL).  Liver Function Tests: Recent Labs  Lab 12/19/21 1308  AST 193*  ALT 448*  ALKPHOS 205*  BILITOT 5.0*  PROT 7.9  ALBUMIN 4.6    Urine analysis:    Component Value Date/Time   COLORURINE YELLOW 12/19/2021 [place] 12/19/2021 1308   LABSPEC 1.009 12/19/2021 1308   PHURINE 5.5 12/19/2021 1308   GLUCOSEU 100 (A) 12/19/2021 1308   GLUCOSEU NEGATIVE 03/07/2014 1654   HGBUR NEGATIVE 12/19/2021 1308   BILIRUBINUR SMALL (A) 12/19/2021 1308   [place] 12/19/2021 1308   PROTEINUR NEGATIVE 12/19/2021 1308   UROBILINOGEN 0.2 03/07/2014 1654   NITRITE NEGATIVE 12/19/2021 1308   [place] 12/19/2021 1308    Radiological Exams on Admission: CT Maxillofacial W Contrast  Result Date: 12/19/2021 CLINICAL DATA:  Headaches. Intermittent fevers. Concern for recurrent sinus infection. EXAM: CT MAXILLOFACIAL WITH CONTRAST TECHNIQUE: Multidetector  CT imaging of the maxillofacial structures was performed with intravenous contrast. Multiplanar CT image reconstructions were also generated. CONTRAST:  43mL OMNIPAQUE IOHEXOL 300 MG/ML  SOLN COMPARISON:  None. FINDINGS: Osseous: No focal osseous lesions or fracture. No significant dental disease. Orbits: The globes and orbits are within normal limits. Sinuses: The paranasal sinuses and mastoid air cells are clear. Soft tissues: Soft tissues the face are unremarkable. Atherosclerotic changes are present at the carotid bifurcations, right greater than left without definite stenosis. Limited intracranial: Unremarkable. IMPRESSION: Negative CT of the face.  No significant sinus disease. Electronically Signed   By: San Morelle M.D.   On: 12/19/2021 15:15   DG Chest Portable 1 View  Result Date: 12/19/2021 CLINICAL DATA:  76 year old male with history of fevers and cough. EXAM: PORTABLE CHEST 1 VIEW COMPARISON:  No priors. FINDINGS: Lung volumes are normal. Diffuse peribronchial cuffing. No consolidative airspace disease. No pleural effusions. No pneumothorax. No pulmonary nodule or mass noted. Pulmonary vasculature and the cardiomediastinal silhouette are within normal limits. IMPRESSION: 1. Diffuse peribronchial cuffing concerning for an acute bronchitis. Electronically Signed   By: Vinnie Langton M.D.   On: 12/19/2021 14:18   US Abdomen Limited RUQ (LIVER/GB)  Result Date: 12/19/2021 CLINICAL DATA:  75 year old male with history of abdominal pain and nausea. EXAM: ULTRASOUND ABDOMEN LIMITED RIGHT UPPER QUADRANT COMPARISON:  No priors. FINDINGS: Gallbladder: There is amorphous material and some small echogenic foci with posterior acoustic shadowing lying dependently in the gallbladder, compatible with a combination of biliary sludge and tiny gallstones. Gallbladder is only moderately distended. Gallbladder wall thickness is normal at 3.3 mm. No pericholecystic fluid. Per report from the  sonographer, there was no sonographic Murphy's sign on examination. Common bile duct: Diameter: 7.3 mm Liver: Tiny hypoechoic lesion in the left lobe of the liver measuring 1.1 x 0.6 x 0.8 cm, indeterminate. Generalized increased echogenicity throughout the hepatic parenchyma, suggestive of hepatic steatosis. Portal vein is patent on color Doppler imaging with normal direction of blood flow towards the liver. Other: None. IMPRESSION: 1. Cholelithiasis and biliary sludge in the gallbladder, without evidence of acute cholecystitis at this time. 2. Mild prominence of the common bile duct measuring 7.3 mm in the porta hepatis. If there is clinical concern for choledocholithiasis or other cause of biliary obstruction, further evaluation with abdominal MRI with and without  IV gadolinium with MRCP should be considered. 3. Hepatic steatosis. 4. Small indeterminate lesion in the left lobe of the liver. This too could be further evaluated with follow-up abdominal MRI with and without IV gadolinium with MRCP. Electronically Signed   By: Vinnie Langton M.D.   On: 12/19/2021 13:53    EKG:  None  Assessment/Plan 1-Dilated cbd, acquired -Reporting abdominal pain, nausea/vomiting and fever at home. -Elevated WBCs and transaminitis appreciated -Started on Unasyn -GI has been consulted and will follow recommendation -MRCP in AM. -Allowing full liquid diet with n.p.o. status after midnight.  2-HTN (hypertension) -Resume home antihypertensive regimen -Heart healthy diet has been encouraged at discharge.  3-GERD (gastroesophageal reflux disease) -Continue PPI.  4-BPH (benign prostatic hyperplasia) -Continue Flomax.  5-Pre-diabetes -Recent A1c 6.2 -Modified carbohydrate diet has been encouraged.   DVT prophylaxis: Heparin Code Status:   Full code Family Communication:  No family Disposition Plan:   Patient is from:  Home  Anticipated DC to:  Home  Anticipated DC date:  12/21/20  Anticipated DC  barriers: Controlling abd pain and definitive treatment of CBD dilatation and transaminitis.  Consults called:  GI service ([facility]) Admission status:  Inpatient, length of stay more than 2 midnights; MedSurg bed.  Severity of Illness: The appropriate patient status for this patient is INPATIENT. Inpatient status is judged to be reasonable and necessary in order to provide the required intensity of service to ensure the patient's safety. The patient's presenting symptoms, physical exam findings, and initial radiographic and laboratory data in the context of their chronic comorbidities is felt to place them at high risk for further clinical deterioration. Furthermore, it is not anticipated that the patient will be medically stable for discharge from the hospital within 2 midnights of admission.   * I certify that at the point of admission it is my clinical judgment that the patient will require inpatient hospital care spanning beyond 2 midnights from the point of admission due to high intensity of service, high risk for further deterioration and high frequency of surveillance required.Barton Dubois MD [facility]  How to contact the [facility] Attending or Consulting provider [place] or covering provider during after hours [place], for this patient?   Check the care team in [facility] and look for a) attending/consulting [facility] provider listed and b) the [facility] team listed Log into [email or web address] and use [place]'s universal password to access. If you do not have the password, please contact the hospital operator. Locate the [facility] provider you are looking for under [facility] and page to a number that you can be directly reached. If you still have difficulty reaching the provider, please page the [facility] (Director on Call) for the Hospitalists listed on amion for assistance.  12/19/2021, 10:12 PM

## 2021-12-19 NOTE — ED Provider Notes (Signed)
Received patient in signout from Dr. Sherry Ruffing.  Briefly with episodic epigastric discomfort and fevers.  Thought to have a sinus infection.  Found to have LFT elevation, CBD dilatation.  Will discuss with GI. Discussed with Amy Easterwood, [facility].  recommends MRCP, medical admission.      Deno Etienne, DO 12/19/21 1600

## 2021-12-20 ENCOUNTER — Inpatient Hospital Stay (HOSPITAL_COMMUNITY): Payer: Medicare HMO

## 2021-12-20 ENCOUNTER — Encounter (HOSPITAL_COMMUNITY): Payer: Self-pay | Admitting: Internal Medicine

## 2021-12-20 ENCOUNTER — Inpatient Hospital Stay (HOSPITAL_COMMUNITY): Payer: Medicare HMO | Admitting: Anesthesiology

## 2021-12-20 ENCOUNTER — Encounter (HOSPITAL_COMMUNITY)
Admission: EM | Disposition: A | Discharge status: Home / Self Care | Payer: Self-pay | Source: Home / Self Care | Attending: Internal Medicine

## 2021-12-20 DIAGNOSIS — K838 Other specified diseases of biliary tract: Secondary | ICD-10-CM | POA: Diagnosis not present

## 2021-12-20 DIAGNOSIS — K8050 Calculus of bile duct without cholangitis or cholecystitis without obstruction: Secondary | ICD-10-CM

## 2021-12-20 DIAGNOSIS — K8051 Calculus of bile duct without cholangitis or cholecystitis with obstruction: Secondary | ICD-10-CM

## 2021-12-20 DIAGNOSIS — R7989 Other specified abnormal findings of blood chemistry: Secondary | ICD-10-CM

## 2021-12-20 DIAGNOSIS — K8031 Calculus of bile duct with cholangitis, unspecified, with obstruction: Secondary | ICD-10-CM

## 2021-12-20 DIAGNOSIS — R933 Abnormal findings on diagnostic imaging of other parts of digestive tract: Secondary | ICD-10-CM

## 2021-12-20 HISTORY — PX: REMOVAL OF STONES: SHX5545

## 2021-12-20 HISTORY — PX: SPHINCTEROTOMY: SHX5544

## 2021-12-20 HISTORY — PX: ERCP: SHX5425

## 2021-12-20 LAB — URINE CULTURE

## 2021-12-20 LAB — CBC
HCT: 36.2 % — ABNORMAL LOW (ref 39.0–52.0)
Hemoglobin: 12.7 g/dL — ABNORMAL LOW (ref 13.0–17.0)
MCH: 32.6 pg (ref 26.0–34.0)
MCHC: 35.1 g/dL (ref 30.0–36.0)
MCV: 93.1 fL (ref 80.0–100.0)
Platelets: 182 10*3/uL (ref 150–400)
RBC: 3.89 MIL/uL — ABNORMAL LOW (ref 4.22–5.81)
RDW: 14 % (ref 11.5–15.5)
WBC: 9.4 10*3/uL (ref 4.0–10.5)
nRBC: 0 % (ref 0.0–0.2)

## 2021-12-20 LAB — COMPREHENSIVE METABOLIC PANEL
ALT: 295 U/L — ABNORMAL HIGH (ref 0–44)
AST: 91 U/L — ABNORMAL HIGH (ref 15–41)
Albumin: 3.8 g/dL (ref 3.5–5.0)
Alkaline Phosphatase: 172 U/L — ABNORMAL HIGH (ref 38–126)
Anion gap: 7 (ref 5–15)
BUN: 16 mg/dL (ref 8–23)
CO2: 24 mmol/L (ref 22–32)
Calcium: 9.1 mg/dL (ref 8.9–10.3)
Chloride: 107 mmol/L (ref 98–111)
Creatinine, Ser: 0.9 mg/dL (ref 0.61–1.24)
GFR, Estimated: >60 mL/min (ref >60)
Glucose, Bld: 151 mg/dL — ABNORMAL HIGH (ref 70–99)
Potassium: 3.9 mmol/L (ref 3.5–5.1)
Sodium: 138 mmol/L (ref 135–145)
Total Bilirubin: 1.6 mg/dL — ABNORMAL HIGH (ref 0.3–1.2)
Total Protein: 6.8 g/dL (ref 6.5–8.1)

## 2021-12-20 SURGERY — ERCP, WITH INTERVENTION IF INDICATED
Anesthesia: General

## 2021-12-20 MED ORDER — LACTATED RINGERS IV SOLN
INTRAVENOUS | Status: AC | PRN
  Administered 2021-12-20: 20 mL/h via INTRAVENOUS

## 2021-12-20 MED ORDER — GLUCAGON HCL RDNA (DIAGNOSTIC) 1 MG IJ SOLR
INTRAMUSCULAR | Status: AC
  Filled 2021-12-20: qty 1

## 2021-12-20 MED ORDER — INDOMETHACIN 50 MG RE SUPP
RECTAL | Status: DC | PRN
  Administered 2021-12-20: 100 mg via RECTAL

## 2021-12-20 MED ORDER — HYDROMORPHONE HCL 1 MG/ML IJ SOLN: 0.2500 mg | INTRAMUSCULAR | Status: DC | PRN

## 2021-12-20 MED ORDER — GADOBUTROL 1 MMOL/ML IV SOLN
7.5000 mL | Freq: Once | INTRAVENOUS | Status: AC | PRN
  Administered 2021-12-20: 7.5 mL via INTRAVENOUS

## 2021-12-20 MED ORDER — FENTANYL CITRATE (PF) 100 MCG/2ML IJ SOLN
INTRAMUSCULAR | Status: AC
  Filled 2021-12-20: qty 2

## 2021-12-20 MED ORDER — INDOMETHACIN 50 MG RE SUPP
RECTAL | Status: AC
  Filled 2021-12-20: qty 2

## 2021-12-20 MED ORDER — PROMETHAZINE HCL 25 MG/ML IJ SOLN: 6.2500 mg | INTRAMUSCULAR | Status: DC | PRN

## 2021-12-20 MED ORDER — PROPOFOL 10 MG/ML IV BOLUS
INTRAVENOUS | Status: DC | PRN
  Administered 2021-12-20: 160 mg via INTRAVENOUS

## 2021-12-20 MED ORDER — DEXAMETHASONE SODIUM PHOSPHATE 10 MG/ML IJ SOLN
INTRAMUSCULAR | Status: DC | PRN
  Administered 2021-12-20: 5 mg via INTRAVENOUS

## 2021-12-20 MED ORDER — PROPOFOL 10 MG/ML IV BOLUS
INTRAVENOUS | Status: AC
  Filled 2021-12-20: qty 20

## 2021-12-20 MED ORDER — SODIUM CHLORIDE 0.9 % IV SOLN
INTRAVENOUS | Status: DC | PRN
  Administered 2021-12-20: 25 mL

## 2021-12-20 MED ORDER — MEPERIDINE HCL 50 MG/ML IJ SOLN: 6.2500 mg | INTRAMUSCULAR | Status: DC | PRN

## 2021-12-20 MED ORDER — SUGAMMADEX SODIUM 200 MG/2ML IV SOLN
INTRAVENOUS | Status: DC | PRN
  Administered 2021-12-20: 300 mg via INTRAVENOUS

## 2021-12-20 MED ORDER — LIDOCAINE 2% (20 MG/ML) 5 ML SYRINGE
INTRAMUSCULAR | Status: DC | PRN
  Administered 2021-12-20: 100 mg via INTRAVENOUS

## 2021-12-20 MED ORDER — FENTANYL CITRATE (PF) 100 MCG/2ML IJ SOLN
INTRAMUSCULAR | Status: DC | PRN
  Administered 2021-12-20: 100 ug via INTRAVENOUS

## 2021-12-20 MED ORDER — ONDANSETRON HCL 4 MG/2ML IJ SOLN
INTRAMUSCULAR | Status: DC | PRN
  Administered 2021-12-20: 4 mg via INTRAVENOUS

## 2021-12-20 MED ORDER — SODIUM CHLORIDE 0.9 % IV SOLN: INTRAVENOUS | Status: AC

## 2021-12-20 MED ORDER — ROCURONIUM BROMIDE 10 MG/ML (PF) SYRINGE
PREFILLED_SYRINGE | INTRAVENOUS | Status: DC | PRN
  Administered 2021-12-20: 60 mg via INTRAVENOUS

## 2021-12-20 MED ORDER — GLUCAGON HCL RDNA (DIAGNOSTIC) 1 MG IJ SOLR
INTRAMUSCULAR | Status: DC | PRN
  Administered 2021-12-20 (×3): .25 mg via INTRAVENOUS

## 2021-12-20 NOTE — Transfer of Care (Signed)
Immediate Anesthesia Transfer of Care Note  Patient: Christian Dodson  Procedure(s) Performed: ENDOSCOPIC RETROGRADE CHOLANGIOPANCREATOGRAPHY (ERCP) SPHINCTEROTOMY REMOVAL OF STONES  Patient Location: PACU  Anesthesia Type:General  Level of Consciousness: awake, alert  and oriented  Airway & Oxygen Therapy: Patient Spontanous Breathing and Patient connected to face mask oxygen  Post-op Assessment: Report given to RN and Post -op Vital signs reviewed and stable  Post vital signs: Reviewed and stable  Last Vitals:  Vitals Value Taken Time  BP    Temp    Pulse    Resp    SpO2      Last Pain:  Vitals:   12/20/21 1457  TempSrc: Oral  PainSc: 0-No pain      Patients Stated Pain Goal: 0 (39/12/25 8346)  Complications: No notable events documented.

## 2021-12-20 NOTE — Op Note (Signed)
[facility] Patient Name: Christian Dodson Procedure Date: 12/20/2021 MRN: [id number] Attending MD: Ladene Artist , MD Date of Birth: 01-Dec-1946 CSN: [id number] Age: 76 Admit Type: Inpatient Procedure:                ERCP Indications:              Bile duct stone(s), Elevated liver enzymes Providers:                Pricilla Riffle. Fuller Plan, MD, Grace Isaac, RN, William Dalton, Technician Referring MD:             [facility] Medicines:                General Anesthesia Complications:            No immediate complications. Estimated Blood Loss:     Estimated blood loss was minimal. Procedure:                Pre-Anesthesia Assessment:                           - Prior to the procedure, a History and Physical                            was performed, and patient medications and                            allergies were reviewed. The patient's tolerance of                            previous anesthesia was also reviewed. The risks                            and benefits of the procedure and the sedation                            options and risks were discussed with the patient.                            All questions were answered, and informed consent                            was obtained. Prior Anticoagulants: The patient has                            taken no previous anticoagulant or antiplatelet                            agents. ASA Grade Assessment: II - A patient with                            mild systemic disease. After reviewing the risks  and benefits, the patient was deemed in                            satisfactory condition to undergo the procedure.                           After obtaining informed consent, the scope was                            passed under direct vision. Throughout the                            procedure, the patient's blood pressure, pulse, and                            oxygen saturations  were monitored continuously. The                            [employer] single use                            duodenoscope was introduced through the mouth, and                            used to inject contrast into and used to inject                            contrast into the bile duct. The ERCP was                            accomplished without difficulty. The patient                            tolerated the procedure well. Scope In: Scope Out: Findings:      The scout film was normal. The esophagus was successfully intubated       under direct vision. The scope was advanced to a normal major papilla       located inside a diverticulum in the descending duodenum without       detailed examination of the pharynx, larynx and associated structures,       and upper GI tract. The upper GI tract was otherwise grossly normal. A       straight Roadrunner wire was passed into the biliary tree. The       short-nosed traction sphincterotome was passed over the guidewire and       the bile duct was then deeply cannulated. Contrast was injected. I       personally interpreted the bile duct images. There was appropriate flow       of contrast through the ducts. The common bile duct contained one stone,       which was 5 mm in diameter. The common bile duct was mildly dilated and       diffusely dilated, with a stone causing an obstruction. The largest       diameter was 8 mm. A 6 mm biliary sphincterotomy was made with a  traction (standard) sphincterotome using ERBE electrocautery. It was       challenging to complete the sphincterotomy safely inside the       diverticulum however it was accomplished. There was no       post-sphincterotomy bleeding. The biliary tree was swept with a 9 mm       balloon starting at the bifurcation several times. One stone was       removed. No stones remained. The PD was not cannulated or injected by       intention. Very good biliary  drainage post stone extraction. Impression:               - The common bile duct was mildly dilated, with a                            stone causing an obstruction.                           - Duodenal diverticulum with major papilla located                            inside.                           - Choledocholithiasis was found. Complete removal                            was accomplished by biliary sphincterotomy and                            balloon extraction.                           - A biliary sphincterotomy was performed.                           - The biliary tree was swept. Moderate Sedation:      Not Applicable - Patient had care per Anesthesia. Recommendation:           - Return patient to hospital ward for ongoing care.                           - Observe patient's clinical course following                            today's ERCP with therapeutic intervention.                           - Avoid aspirin and nonsteroidal anti-inflammatory                            medicines for 1 week.                           - Trend LFTs.                           - Surgical consult for cholecystectomy. Procedure  Code(s):        --- Professional ---                           [phone], Endoscopic retrograde                            cholangiopancreatography (ERCP); with removal of                            calculi/debris from biliary/pancreatic duct(s)                           43262, Endoscopic retrograde                            cholangiopancreatography (ERCP); with                            sphincterotomy/papillotomy Diagnosis Code(s):        --- Professional ---                           K80.51, Calculus of bile duct without cholangitis                            or cholecystitis with obstruction                           R74.8, Abnormal levels of other serum enzymes CPT copyright 2019 [employer]. All rights reserved. The codes documented in this report are  preliminary and upon coder review may  be revised to meet current compliance requirements. Ladene Artist, MD 12/20/2021 4:11:51 PM This report has been signed electronically. Number of Addenda: 0

## 2021-12-20 NOTE — H&P (View-Only) (Signed)
° ° °Consultation ° °Referring Provider: TR[facility]/ Mathews MD °Primary Care Physician:  Crawford, Elizabeth A, MD °Primary Gastroenterologist:  Dr. Pyrtle ° °Reason for Consultation:  epigastric  pain, fevers, elevated LFT's ° °HPI: Christian Dodson is a 75 y.o. male, known to Dr. Pyrtle from prior colonoscopy which had been done in July 2022.  He had removal of 3 tubular adenomas and 2 hyperplastic polyps and was noted to have multiple diverticuli. °Patient also with history of GERD, very remote history of ulcer disease, BPH and status post COVID-07 November 2021.  Patient has osteoarthritis and is awaiting upcoming hip replacement towards the end of January. °Patient presented to the ER at to droppage yesterday with complaints of intermittent fevers over the prior few days and concerns for sinus infection.  He mentioned that he also had an episode of epigastric pain which had occurred on 12/17/2021 and lasted for several hours and resolved.  This was associated with nausea and vomiting x1, no radiation to the chest or back.  He had also noted some darker urine earlier in the week prior to the episode of epigastric pain. ° °Work-up in the ER with abnormal LFTs and elevated WBC.  WBC 13.8/hemoglobin 14.8/Mehta crit 42 °Potassium 3.3 °Creatinine 1.08 °T bili 5.0/alk phos 205/AST 193/ALT 448, lipase 20 °Lactate 1.5 °Blood cultures pending respiratory panel negative ° °Upper abdominal ultrasound showed gallstones and sludge, normal gallbladder wall thickening, no pericholecystic fluid, CBD of 7.3 mm and there is a 1.1 x 0.6 cm indeterminant liver lesion as well as hepatic steatosis. ° °Patient transferred here for admission, he has been started on IV antibiotics/Unasyn °Afebrile since admission and patient has no complaints of abdominal pain or nausea, he is hungry ° °Labs today WBC 9.4/hemoglobin 12.7/hematocrit 36 °T bili 1.6/alk phos 172/AST 91/ALT 295 improved ° ° ° °Past Medical History:  °Diagnosis Date  ° Arthritis    ° BPH (benign prostatic hyperplasia) 10/19/2013  ° GERD (gastroesophageal reflux disease)   ° History of stomach ulcers   ° age 6  ° Hypertension   ° Pneumonia   ° x4  ° Pre-diabetes   ° Skin cancer   ° ° °Past Surgical History:  °Procedure Laterality Date  ° APPENDECTOMY    ° COLONOSCOPY  2010  ° [place]=normal exam per pt  ° INGUINAL HERNIA REPAIR Right   ° left shoulder rotator and bicep  01/10/2017  ° MEDIAL PARTIAL KNEE REPLACEMENT Right 2013  ° TONSILLECTOMY AND ADENOIDECTOMY    ° TOTAL HIP ARTHROPLASTY Left 09/30/2020  ° Procedure: TOTAL HIP ARTHROPLASTY ANTERIOR APPROACH;  Surgeon: Olin, Matthew, MD;  Location: [facility];  Service: Orthopedics;  Laterality: Left;  70 mins  ° UPPER GI ENDOSCOPY    ° ° °Prior to Admission medications   °Medication Sig Start Date End Date Taking? Authorizing Provider  °acetaminophen (TYLENOL) 650 MG CR tablet Take 650 mg by mouth See admin instructions. Take 2 tablets (1300 mg) by mouth daily at bedtime, may also take 2 tablets (1300 mg) two more times daily as needed for pain   Yes [provider]  °amoxicillin (AMOXIL) 500 MG capsule Take 2,000 mg by mouth See admin instructions. Take 4 capsules (2000 mg) by mouth one hour prior to dental appointments   Yes [provider]  °Ascorbic Acid (VITAMIN C PO) Take 1 tablet by mouth every morning.   Yes [provider]  °cetirizine (ZYRTEC) 10 MG tablet Take 10 mg by mouth every morning.   Yes [provider]  °finasteride (PROSCAR) 5 MG tablet TAKE 1 TABLET DAILY °Patient taking differently: 5 mg at bedtime. 04/18/20  Yes Crawford, Elizabeth A, MD  °GLUCOSAMINE-CHONDROITIN PO Take 1 tablet by mouth every morning.   Yes [provider]  °hydrochlorothiazide (HYDRODIURIL) 25 MG tablet Take 1 tablet (25 mg total) by mouth daily. Annual appt due in May must see provider for future refills °Patient taking differently: Take 25 mg by mouth every morning. Annual appt due in May must see provider  for future refills 11/16/21  Yes Crawford, Elizabeth A, MD  °lisinopril (ZESTRIL) 40 MG tablet TAKE 1 TABLET DAILY °Patient taking differently: Take 40 mg by mouth every morning. 11/16/21  Yes Crawford, Elizabeth A, MD  °Multiple Vitamins-Minerals (ZINC PO) Take 1 tablet by mouth every morning.   Yes [provider]  °omeprazole (PRILOSEC OTC) 20 MG tablet Take 20 mg by mouth every morning.   Yes [provider]  °simvastatin (ZOCOR) 20 MG tablet Take 1 tablet (20 mg total) by mouth at bedtime. 05/06/21  Yes Crawford, Elizabeth A, MD  °VITAMIN D PO Take 1 tablet by mouth every morning.   Yes [provider]  °omeprazole (PRILOSEC) 20 MG capsule Take 20 mg by mouth daily. °Patient not taking: Reported on 12/19/2021    [provider]  ° ° °Current Facility-Administered Medications  °Medication Dose Route Frequency Provider Last Rate Last Admin  ° 0.9 %  sodium chloride infusion   Intravenous Continuous Madera, Carlos, MD 75 mL/hr at 12/19/21 2147 New Bag at 12/19/21 2147  ° acetaminophen (TYLENOL) tablet 650 mg  650 mg Oral Q6H PRN Madera, Carlos, MD   650 mg at 12/19/21 2145  ° Or  ° acetaminophen (TYLENOL) suppository 650 mg  650 mg Rectal Q6H PRN Madera, Carlos, MD      ° Ampicillin-Sulbactam (UNASYN) 3 g in sodium chloride 0.9 % 100 mL IVPB  3 g Intravenous Q8H Madera, Carlos, MD 200 mL/hr at 12/20/21 0924 3 g at 12/20/21 0924  ° finasteride (PROSCAR) tablet 5 mg  5 mg Oral Daily Madera, Carlos, MD   5 mg at 12/19/21 2145  ° heparin injection 5,000 Units  5,000 Units Subcutaneous Q8H Madera, Carlos, MD   5,000 Units at 12/20/21 0549  ° hydrochlorothiazide (HYDRODIURIL) tablet 25 mg  25 mg Oral Daily Madera, Carlos, MD      ° lisinopril (ZESTRIL) tablet 40 mg  40 mg Oral Daily Madera, Carlos, MD      ° morphine 2 MG/ML injection 2 mg  2 mg Intravenous Q3H PRN Madera, Carlos, MD      ° ondansetron (ZOFRAN) tablet 4 mg  4 mg Oral Q6H PRN Madera, Carlos, MD      ° Or  ° ondansetron  (ZOFRAN) injection 4 mg  4 mg Intravenous Q6H PRN Madera, Carlos, MD      ° pantoprazole (PROTONIX) EC tablet 40 mg  40 mg Oral Daily Madera, Carlos, MD      ° ° °Allergies as of 12/19/2021  ° (No Known Allergies)  ° ° °Family History  °Problem Relation Age of Onset  ° Colon polyps Mother   ° Hypertension Mother   ° Cancer Father   °     lung  ° Hyperlipidemia Maternal Grandfather   ° Heart disease Maternal Grandfather   ° Stroke Paternal Grandmother   ° Heart disease Paternal Grandmother   ° Hyperlipidemia Paternal Grandfather   ° Diabetes Neg Hx   ° Colon cancer Neg Hx   °   Esophageal cancer Neg Hx   ° Rectal cancer Neg Hx   ° Stomach cancer Neg Hx   ° ° °Social History  ° °Socioeconomic History  ° Marital status: Married  °  Spouse name: Not on file  ° Number of children: 2  ° Years of education: Not on file  ° Highest education level: Not on file  °Occupational History  ° Occupation: retired  °Tobacco Use  ° Smoking status: Never  ° Smokeless tobacco: Never  °Vaping Use  ° Vaping Use: Never used  °Substance and Sexual Activity  ° Alcohol use: Not Currently  °  Comment: occassionally- once a month per pt  ° Drug use: No  ° Sexual activity: Yes  °Other Topics Concern  ° Not on file  °Social History Narrative  ° Not on file  ° °Social Determinants of Health  ° °Financial Resource Strain: Low Risk   ° Difficulty of Paying Living Expenses: Not hard at all  °Food Insecurity: No Food Insecurity  ° Worried About Running Out of Food in the Last Year: Never true  ° Ran Out of Food in the Last Year: Never true  °Transportation Needs: No Transportation Needs  ° Lack of Transportation (Medical): No  ° Lack of Transportation (Non-Medical): No  °Physical Activity: Sufficiently Active  ° Days of Exercise per Week: 5 days  ° Minutes of Exercise per Session: 30 min  °Stress: No Stress Concern Present  ° Feeling of Stress : Not at all  °Social Connections: Socially Integrated  ° Frequency of Communication with Friends and Family:  More than three times a week  ° Frequency of Social Gatherings with Friends and Family: More than three times a week  ° Attends Religious Services: More than 4 times per year  ° Active Member of Clubs or Organizations: Yes  ° Attends Club or Organization Meetings: More than 4 times per year  ° Marital Status: Married  °Intimate Partner Violence: Not on file  ° ° °Review of Systems: °Pertinent positive and negative review of systems were noted in the above HPI section.  All other review of systems was otherwise negative.  ° °Physical Exam: °Vital signs in last 24 hours: °Temp:  [97.7 °F (36.5 °C)-98.7 °F (37.1 °C)] 97.9 °F (36.6 °C) (01/01 0537) °Pulse Rate:  [72-104] 72 (01/01 0537) °Resp:  [16-18] 16 (01/01 0537) °BP: (127-152)/(56-90) 142/56 (01/01 0537) °SpO2:  [97 %-99 %] 99 % (01/01 0537) °Last BM Date: 12/19/21 °General:   Alert, well-developed, well-nourished, older white male pleasant and cooperative in NAD wife at bedside-jaundice °Head:  Normocephalic and atraumatic. °Eyes:  Sclera icteric.   Conjunctiva pink. °Ears:  Normal auditory acuity. °Nose:  No deformity, discharge,  or lesions. °Mouth:  No deformity or lesions.   °Neck:  Supple; no masses or thyromegaly. °Lungs:  Clear throughout to auscultation.   No wheezes, crackles, or rhonchi. ° Heart:  Regular rate and rhythm; no murmurs, clicks, rubs,  or gallops. °Abdomen:  Soft,nontender, BS active,nonpalp mass or hsm.   °Rectal: Not done °Msk:  Symmetrical without gross deformities. . °Pulses:  Normal pulses noted. °Extremities:  Without clubbing or edema. °Neurologic:  Alert and  oriented x4;  grossly normal neurologically. °Skin:  Intact without significant lesions or rashes.. °Psych:  Alert and cooperative. Normal mood and affect. ° °Intake/Output from previous day: °12/31 0701 - 01/01 0700 °In: 1732 [P.O.:120; I.V.:278.3; IV Piggyback:1333.8] °Out: 500 [Urine:500] °Intake/Output this shift: °Total I/O °In: 0  °Out: 200 [Urine:200] ° °Lab    Results: °Recent Labs  °  12/19/21 °1308 12/20/21 °0448  °WBC 13.8* 9.4  °HGB 14.8 12.7*  °HCT 42.3 36.2*  °PLT 218 182  ° °BMET °Recent Labs  °  12/19/21 °1308 12/20/21 °0448  °NA 135 138  °K 3.3* 3.9  °CL 96* 107  °CO2 27 24  °GLUCOSE 154* 151*  °BUN 16 16  °CREATININE 1.08 0.90  °CALCIUM 10.3 9.1  ° °LFT °Recent Labs  °  12/20/21 °0448  °PROT 6.8  °ALBUMIN 3.8  °AST 91*  °ALT 295*  °ALKPHOS 172*  °BILITOT 1.6*  ° °PT/INR °No results for input(s): LABPROT, INR in the last 72 hours. °Hepatitis Panel °No results for input(s): HEPBSAG, HCVAB, HEPAIGM, HEPBIGM in the last 72 hours. ° ° °IMPRESSION:  °#1 75-year-old white male with acute episode of epigastric pain on 12/17/2022, intermittent low-grade fevers at home over the past 3 to 4 days and had noted darker urine °Ultrasound confirms gallstones and gallbladder sludge, no definite cholecystitis, dilated CBD at 7.3 mm, hepatic steatosis and indeterminate 1.0 x 0.6 cm liver lesion °LFTs elevated with T bili 5.0 and leukocytosis ° °Picture is consistent with choledocholithiasis, possible early cholangitis ° °Labs are improved today, patient has no complaints of abdominal pain has been afebrile since admission ° °Rule out persistent choledocholithiasis versus passed CBD stone °Also needs further evaluation of the indeterminant liver lesion ° °#2 GERD °#3  BPH °#4  Osteoarthritis-hip replacement scheduled late January °#5 history of adenomatous colon polyps-up-to-date with colonoscopy just done July 2022 ° °Plan:  °n.p.o. until after MRI/MRCP today and may be okay for regular diet if no procedures to be done today. ° °If MRI/MRCP positive for common bile duct stone he will be scheduled for ERCP with stone extraction likely tomorrow ° °Continue IV Unasyn °Repeat labs in a.m. °Please ask surgery to see in consultation for eventual laparoscopic cholecystectomy.  Patient is concerned about the potential timing of this as he is scheduled for hip replacement late January.   Will defer to surgery ° °GI will follow with you ° ° °Amy Esterwood PA-C 12/20/2021, 10:05 AM ° ° ° ° Attending Physician Note  ° °I have taken a history, reviewed the chart and examined the patient. I personally saw the patient and performed performed more than 50% of this encounter in conjunction with the APP. I agree with the APP's note, impression and recommendations. My additional impressions and recommendations as follows.  ° °Choledocholithiasis, cholelithiasis, R/O cholangitis. Epigastric pain, fevers, elevated LFTs. US shows cholelithiasis, mildly dilated CBD. MRCP shows CBD stones, CBD dilation to 8 mm, cholelithiasis, hepatic steatosis, large hiatal hernia, hepatic cyst.  °Continue IV Unasyn. Surgical consult to consider cholecystectomy. Schedule ERCP. The risks (including pancreatitis, which can be severe, bleeding, perforation, infection, missed lesions, medication reactions and possible hospitalization or surgery if complications occur), benefits, and alternatives to endoscopy with possible biopsy and possible dilation were discussed with the patient and they consent to proceed.   ° °Sherida Dobkins, MD FACG °See [facility], for our on call provider  ° °  °

## 2021-12-20 NOTE — Interval H&P Note (Signed)
History and Physical Interval Note:  12/20/2021 3:05 PM  Christian Dodson  has presented today for surgery, with the diagnosis of CBD stone, R/O cholangitis.  The various methods of treatment have been discussed with the patient and family. After consideration of risks, benefits and other options for treatment, the patient has consented to  Procedure(s): ENDOSCOPIC RETROGRADE CHOLANGIOPANCREATOGRAPHY (ERCP) (N/A) as a surgical intervention.  The patient's history has been reviewed, patient examined, no change in status, stable for surgery.  I have reviewed the patient's chart and labs.  Questions were answered to the patient's satisfaction.     Pricilla Riffle. [place] Plan

## 2021-12-20 NOTE — Anesthesia Postprocedure Evaluation (Signed)
Anesthesia Post Note  Patient: Christian Dodson  Procedure(s) Performed: ENDOSCOPIC RETROGRADE CHOLANGIOPANCREATOGRAPHY (ERCP) SPHINCTEROTOMY REMOVAL OF STONES     Patient location during evaluation: PACU Anesthesia Type: General Level of consciousness: awake Pain management: pain level controlled Respiratory status: spontaneous breathing Cardiovascular status: stable Postop Assessment: no apparent nausea or vomiting Anesthetic complications: no   No notable events documented.  Last Vitals:  Vitals:   12/20/21 1615 12/20/21 1630  BP: (!) 164/71 (!) 155/93  Pulse: 86 80  Resp: 18 20  Temp:    SpO2: 99% 97%    Last Pain:  Vitals:   12/20/21 1630  TempSrc:   PainSc: [name]

## 2021-12-20 NOTE — Progress Notes (Signed)
PROGRESS NOTE    Christian Dodson  PFX:[id number] DOB: 07/25/1946 DOA: 12/19/2021 PCP: Hoyt Koch, MD   Brief Narrative: Christian Dodson is a 76 y.o. male, known to Dr. Hilarie Fredrickson from prior colonoscopy which had been done in July 2022.  He had removal of 3 tubular adenomas and 2 hyperplastic polyps and was noted to have multiple diverticuli. Patient also with history of GERD, very remote history of ulcer disease, BPH and status post COVID-07 November 2021.  Patient has osteoarthritis and is awaiting upcoming hip replacement towards the end of January. Patient presented to the ER at to droppage yesterday with complaints of intermittent fevers over the prior few days and concerns for sinus infection.  He mentioned that he also had an episode of epigastric pain which had occurred on 12/17/2021 and lasted for several hours and resolved.  This was associated with nausea and vomiting x1, no radiation to the chest or back.  He had also noted some darker urine earlier in the week prior to the episode of epigastric pain.   Work-up in the ER with abnormal LFTs and elevated WBC.  WBC 13.8/hemoglobin 14.8/Mehta crit 42 Potassium 3.3 Creatinine 1.08 T bili 5.0/alk phos 205/AST 193/ALT 448, lipase 20 Lactate 1.5 Blood cultures pending respiratory panel negative   Upper abdominal ultrasound showed gallstones and sludge, normal gallbladder wall thickening, no pericholecystic fluid, CBD of 7.3 mm and there is a 1.1 x 0.6 cm indeterminant liver lesion as well as hepatic steatosis.   Patient transferred here for admission, he has been started on IV antibiotics/Unasyn Afebrile since admission and patient has no complaints of abdominal pain or nausea, he is hungry   Labs today WBC 9.4/hemoglobin 12.7/hematocrit 36 T bili 1.6/alk phos 172/AST 91/ALT 295 improved  Assessment & Plan:   Principal Problem:   Dilated cbd, acquired Active Problems:   HTN (hypertension)   GERD (gastroesophageal reflux  disease)   BPH (benign prostatic hyperplasia)   Pre-diabetes   Transaminitis    #1 choledocholithiasis/cholangitis patient admitted with abdominal pain nausea and vomiting and fever at home.  Currently he is pain-free  LFTs trending down.   He is on Unasyn continue MRCP pending.  Appreciate GI input.  Will consult general surgery for possible cholecystectomy during this hospital admission. Gallbladder ultrasound with stones and sludge.  CBD hepatic steatosis and indeterminate liver lesion. Total bilirubin down to 1.6 AST 91 ALT 295  #2 history of essential hypertension blood pressure 143/92 Continue home meds.  Zestril none 40 mg daily.  #3 BPH on Flomax  #4 GERD on PPI   Estimated body mass index is 27.6 kg/m as calculated from the following:   Height as of 12/09/21: _0  (1.676 m).   Weight as of 12/09/21: 77.6 kg.  DVT prophylaxis: Heparin Code Status: Full code Family Communication: None at bedside Disposition Plan:  Status is: Inpatient  Remains inpatient appropriate because: Admitted with cholangitis    Consultants: GI and general surgery  Procedures: None Antimicrobials: Unasyn  Subjective:  Patient is resting in bed he is awake and alert He denies any abdominal pain but has not had anything to eat Objective: Vitals:   12/19/21 2030 12/20/21 0115 12/20/21 0537 12/20/21 1042  BP: (!) 149/90 133/75 (!) 142/56 (!) 143/92  Pulse: (!) 104 86 72 80  Resp: _1 Temp: 98.5 F (36.9 C) 97.7 F (36.5 C) 97.9 F (36.6 C) 98.9 F (37.2 C)  TempSrc: Oral Oral Oral   SpO2: 97%  98% 99% 96%    Intake/Output Summary (Last 24 hours) at 12/20/2021 1231 Last data filed at 12/20/2021 1214 Gross per 24 hour  Intake 1732.03 ml  Output 1200 ml  Net 532.03 ml   There were no vitals filed for this visit.  Examination:  General exam: Appears calm and comfortable  Respiratory system: Clear to auscultation. Respiratory effort normal. Cardiovascular system: S1 &  S2 heard, RRR. No JVD, murmurs, rubs, gallops or clicks. No pedal edema. Gastrointestinal system: Abdomen is nondistended, soft and nontender. No organomegaly or masses felt. Normal bowel sounds heard. Central nervous system: Alert and oriented. No focal neurological deficits. Extremities: Symmetric 5 x 5 power. Skin: No rashes, lesions or ulcers Psychiatry: Judgement and insight appear normal. Mood & affect appropriate.     Data Reviewed: I have personally reviewed following labs and imaging studies  CBC: Recent Labs  Lab 12/19/21 1308 12/20/21 0448  WBC 13.8* 9.4  NEUTROABS 11.1*  --   HGB 14.8 12.7*  HCT 42.3 36.2*  MCV 91.2 93.1  PLT 218 161   Basic Metabolic Panel: Recent Labs  Lab 12/19/21 1308 12/20/21 0448  NA 135 138  K 3.3* 3.9  CL 96* 107  CO2 27 24  GLUCOSE 154* 151*  BUN 16 16  CREATININE 1.08 0.90  CALCIUM 10.3 9.1   GFR: Estimated Creatinine Clearance: 71.7 mL/min (by C-G formula based on SCr of 0.9 mg/dL). Liver Function Tests: Recent Labs  Lab 12/19/21 1308 12/20/21 0448  AST 193* 91*  ALT 448* 295*  ALKPHOS 205* 172*  BILITOT 5.0* 1.6*  PROT 7.9 6.8  ALBUMIN 4.6 3.8   Recent Labs  Lab 12/19/21 1308  LIPASE 20   No results for input(s): AMMONIA in the last 168 hours. Coagulation Profile: No results for input(s): INR, PROTIME in the last 168 hours. Cardiac Enzymes: No results for input(s): CKTOTAL, CKMB, CKMBINDEX, TROPONINI in the last 168 hours. BNP (last 3 results) No results for input(s): PROBNP in the last 8760 hours. HbA1C: No results for input(s): HGBA1C in the last 72 hours. CBG: No results for input(s): GLUCAP in the last 168 hours. Lipid Profile: No results for input(s): CHOL, HDL, LDLCALC, TRIG, CHOLHDL, LDLDIRECT in the last 72 hours. Thyroid Function Tests: No results for input(s): TSH, T4TOTAL, FREET4, T3FREE, THYROIDAB in the last 72 hours. Anemia Panel: No results for input(s): VITAMINB12, FOLATE, FERRITIN, TIBC,  IRON, RETICCTPCT in the last 72 hours. Sepsis Labs: Recent Labs  Lab 12/19/21 1308  LATICACIDVEN 1.0   1.5    Recent Results (from the past 240 hour(s))  Resp Panel by RT-PCR (Flu A&B, Covid) Urine, Clean Catch     Status: None   Collection Time: 12/19/21  1:08 PM   Specimen: Urine, Clean Catch; Nasopharyngeal(NP) swabs in vial transport medium  Result Value Ref Range Status   SARS Coronavirus 2 by RT PCR NEGATIVE NEGATIVE Final    Comment: (NOTE) SARS-CoV-2 target nucleic acids are NOT DETECTED.  The SARS-CoV-2 RNA is generally detectable in upper respiratory specimens during the acute phase of infection. The lowest concentration of SARS-CoV-2 viral copies this assay can detect is 138 copies/mL. A negative result does not preclude SARS-Cov-2 infection and should not be used as the sole basis for treatment or other patient management decisions. A negative result may occur with  improper specimen collection/handling, submission of specimen other than nasopharyngeal swab, presence of viral mutation(s) within the areas targeted by this assay, and inadequate number of viral copies(<138 copies/mL). A negative  result must be combined with clinical observations, patient history, and epidemiological information. The expected result is Negative.  Fact Sheet for Patients:  EntrepreneurPulse.com.au  Fact Sheet for Healthcare Providers:  IncredibleEmployment.be  This test is no t yet approved or cleared by the [place] FDA and  has been authorized for detection and/or diagnosis of SARS-CoV-2 by FDA under an Emergency Use Authorization (EUA). This EUA will remain  in effect (meaning this test can be used) for the duration of the COVID-19 declaration under Section 564(b)(1) of the Act, 21 U.S.C.section 360bbb-3(b)(1), unless the authorization is terminated  or revoked sooner.       Influenza A by PCR NEGATIVE NEGATIVE Final   Influenza B by PCR  NEGATIVE NEGATIVE Final    Comment: (NOTE) The Xpert Xpress SARS-CoV-2/FLU/RSV plus assay is intended as an aid in the diagnosis of influenza from Nasopharyngeal swab specimens and should not be used as a sole basis for treatment. Nasal washings and aspirates are unacceptable for Xpert Xpress SARS-CoV-2/FLU/RSV testing.  Fact Sheet for Patients: EntrepreneurPulse.com.au  Fact Sheet for Healthcare Providers: IncredibleEmployment.be  This test is not yet approved or cleared by the [place] FDA and has been authorized for detection and/or diagnosis of SARS-CoV-2 by FDA under an Emergency Use Authorization (EUA). This EUA will remain in effect (meaning this test can be used) for the duration of the COVID-19 declaration under Section 564(b)(1) of the Act, 21 U.S.C. section 360bbb-3(b)(1), unless the authorization is terminated or revoked.  Performed at [facility], [address] [place], [place] [address]   Blood culture (routine x 2)     Status: None (Preliminary result)   Collection Time: 12/19/21  3:39 PM   Specimen: BLOOD  Result Value Ref Range Status   Specimen Description   Final    BLOOD RIGHT ANTECUBITAL Performed at [facility], [address] [place], [place] [address]    Special Requests   Final    BOTTLES DRAWN AEROBIC AND ANAEROBIC Blood Culture results may not be optimal due to an excessive volume of blood received in culture bottles Performed at [facility], [address], [place], [place] [address]    Culture   Final    NO GROWTH < 24 HOURS Performed at [facility], [place] [address] [place], [place] [address]    Report Status PENDING  Incomplete  Blood culture (routine x 2)     Status: None (Preliminary result)   Collection Time: 12/19/21  4:00 PM   Specimen: BLOOD  Result Value Ref Range Status   Specimen Description   Final    BLOOD BLOOD RIGHT  HAND Performed at [facility], [address], [place], [place] [address]    Special Requests   Final    BOTTLES DRAWN AEROBIC AND ANAEROBIC Blood Culture results may not be optimal due to an excessive volume of blood received in culture bottles Performed at [facility], [address] [place], [place] [address]    Culture   Final    NO GROWTH < 24 HOURS Performed at [facility], [place] [address] [place], [place] [address]    Report Status PENDING  Incomplete         Radiology Studies: MR 3D Recon At Scanner  Result Date: 12/20/2021 CLINICAL DATA:  76 year old male with history of dilated common bile duct on recent ultrasound examination. Evaluate for choledocholithiasis. EXAM: MRI ABDOMEN WITH CONTRAST (WITH MRCP) TECHNIQUE: Multiplanar multisequence MR imaging of  the abdomen was performed following the administration of intravenous contrast. Heavily T2-weighted images of the biliary and pancreatic ducts were obtained, and three-dimensional MRCP images were rendered by post processing. CONTRAST:  7.75m GADAVIST GADOBUTROL 1 MMOL/ML IV SOLN COMPARISON:  No prior abdominal MRI. Abdominal ultrasound 12/19/2021. FINDINGS: Lower chest: Large hiatal hernia. Hepatobiliary: Mild diffuse loss of signal intensity throughout the hepatic parenchyma on out of phase dual echo images, indicative of hepatic steatosis. 9 mm T1 hypointense, T2 hyperintense, nonenhancing lesion in segment 7 of the liver, compatible with a simple cyst. No other suspicious hepatic lesions are noted. Numerous small filling defects lie dependently in the gallbladder, compatible with gallstones and/or biliary sludge. Gallbladder is moderately distended. Gallbladder wall thickness is normal. No pericholecystic fluid. Common bile duct is mildly dilated measuring 8 mm. Tiny filling defects within the common bile duct, compatible with choledocholithiasis, largest of which measures 4 mm in  the mid common bile duct (coronal T2 weighted image 17 of series 5). Pancreas: No pancreatic mass. No pancreatic ductal dilatation. No pancreatic or peripancreatic fluid collections or inflammatory changes. Spleen:  Unremarkable. Adrenals/Urinary Tract: Bilateral kidneys and bilateral adrenal glands are normal in appearance. No hydroureteronephrosis in the visualized portions of the abdomen. Stomach/Bowel: Visualized portions are unremarkable. Vascular/Lymphatic: Aortic atherosclerosis, without evidence of aneurysm in the abdominal vasculature. No lymphadenopathy noted in the abdomen. Other: No significant volume of ascites noted in the visualized portions of the peritoneal cavity. Musculoskeletal: No aggressive appearing osseous lesions are noted in the visualized portions of the skeleton. IMPRESSION: 1. Choledocholithiasis with mild common bile duct dilatation, indicating mild biliary tract obstruction. No intrahepatic biliary ductal dilatation. 2. Cholelithiasis without evidence of acute cholecystitis. 3. Hepatic steatosis. 4. Large hiatal hernia. Electronically Signed   By: DVinnie LangtonM.D.   On: 12/20/2021 12:22   CT Maxillofacial W Contrast  Result Date: 12/19/2021 CLINICAL DATA:  Headaches. Intermittent fevers. Concern for recurrent sinus infection. EXAM: CT MAXILLOFACIAL WITH CONTRAST TECHNIQUE: Multidetector CT imaging of the maxillofacial structures was performed with intravenous contrast. Multiplanar CT image reconstructions were also generated. CONTRAST:  778mOMNIPAQUE IOHEXOL 300 MG/ML  SOLN COMPARISON:  None. FINDINGS: Osseous: No focal osseous lesions or fracture. No significant dental disease. Orbits: The globes and orbits are within normal limits. Sinuses: The paranasal sinuses and mastoid air cells are clear. Soft tissues: Soft tissues the face are unremarkable. Atherosclerotic changes are present at the carotid bifurcations, right greater than left without definite stenosis. Limited  intracranial: Unremarkable. IMPRESSION: Negative CT of the face.  No significant sinus disease. Electronically Signed   By: ChSan Morelle.D.   On: 12/19/2021 15:15   DG Chest Portable 1 View  Result Date: 12/19/2021 CLINICAL DATA:  [age]ear old male with history of fevers and cough. EXAM: PORTABLE CHEST 1 VIEW COMPARISON:  No priors. FINDINGS: Lung volumes are normal. Diffuse peribronchial cuffing. No consolidative airspace disease. No pleural effusions. No pneumothorax. No pulmonary nodule or mass noted. Pulmonary vasculature and the cardiomediastinal silhouette are within normal limits. IMPRESSION: 1. Diffuse peribronchial cuffing concerning for an acute bronchitis. Electronically Signed   By: DaVinnie Langton.D.   On: 12/19/2021 14:18   MR ABDOMEN WITH MRCP W CONTRAST  Result Date: 12/20/2021 CLINICAL DATA:  [age]ear old male with history of dilated common bile duct on recent ultrasound examination. Evaluate for choledocholithiasis. EXAM: MRI ABDOMEN WITH CONTRAST (WITH MRCP) TECHNIQUE: Multiplanar multisequence MR imaging of the abdomen was performed following the administration of intravenous contrast. Heavily T2-weighted images of the biliary and  pancreatic ducts were obtained, and three-dimensional MRCP images were rendered by post processing. CONTRAST:  7.53m GADAVIST GADOBUTROL 1 MMOL/ML IV SOLN COMPARISON:  No prior abdominal MRI. Abdominal ultrasound 12/19/2021. FINDINGS: Lower chest: Large hiatal hernia. Hepatobiliary: Mild diffuse loss of signal intensity throughout the hepatic parenchyma on out of phase dual echo images, indicative of hepatic steatosis. 9 mm T1 hypointense, T2 hyperintense, nonenhancing lesion in segment 7 of the liver, compatible with a simple cyst. No other suspicious hepatic lesions are noted. Numerous small filling defects lie dependently in the gallbladder, compatible with gallstones and/or biliary sludge. Gallbladder is moderately distended. Gallbladder wall  thickness is normal. No pericholecystic fluid. Common bile duct is mildly dilated measuring 8 mm. Tiny filling defects within the common bile duct, compatible with choledocholithiasis, largest of which measures 4 mm in the mid common bile duct (coronal T2 weighted image 17 of series 5). Pancreas: No pancreatic mass. No pancreatic ductal dilatation. No pancreatic or peripancreatic fluid collections or inflammatory changes. Spleen:  Unremarkable. Adrenals/Urinary Tract: Bilateral kidneys and bilateral adrenal glands are normal in appearance. No hydroureteronephrosis in the visualized portions of the abdomen. Stomach/Bowel: Visualized portions are unremarkable. Vascular/Lymphatic: Aortic atherosclerosis, without evidence of aneurysm in the abdominal vasculature. No lymphadenopathy noted in the abdomen. Other: No significant volume of ascites noted in the visualized portions of the peritoneal cavity. Musculoskeletal: No aggressive appearing osseous lesions are noted in the visualized portions of the skeleton. IMPRESSION: 1. Choledocholithiasis with mild common bile duct dilatation, indicating mild biliary tract obstruction. No intrahepatic biliary ductal dilatation. 2. Cholelithiasis without evidence of acute cholecystitis. 3. Hepatic steatosis. 4. Large hiatal hernia. Electronically Signed   By: DVinnie LangtonM.D.   On: 12/20/2021 12:22   [place]Abdomen Limited RUQ (LIVER/GB)  Result Date: 12/19/2021 CLINICAL DATA:  76year old male with history of abdominal pain and nausea. EXAM: ULTRASOUND ABDOMEN LIMITED RIGHT UPPER QUADRANT COMPARISON:  No priors. FINDINGS: Gallbladder: There is amorphous material and some small echogenic foci with posterior acoustic shadowing lying dependently in the gallbladder, compatible with a combination of biliary sludge and tiny gallstones. Gallbladder is only moderately distended. Gallbladder wall thickness is normal at 3.3 mm. No pericholecystic fluid. Per report from the sonographer,  there was no sonographic Murphy's sign on examination. Common bile duct: Diameter: 7.3 mm Liver: Tiny hypoechoic lesion in the left lobe of the liver measuring 1.1 x 0.6 x 0.8 cm, indeterminate. Generalized increased echogenicity throughout the hepatic parenchyma, suggestive of hepatic steatosis. Portal vein is patent on color Doppler imaging with normal direction of blood flow towards the liver. Other: None. IMPRESSION: 1. Cholelithiasis and biliary sludge in the gallbladder, without evidence of acute cholecystitis at this time. 2. Mild prominence of the common bile duct measuring 7.3 mm in the porta hepatis. If there is clinical concern for choledocholithiasis or other cause of biliary obstruction, further evaluation with abdominal MRI with and without IV gadolinium with MRCP should be considered. 3. Hepatic steatosis. 4. Small indeterminate lesion in the left lobe of the liver. This too could be further evaluated with follow-up abdominal MRI with and without IV gadolinium with MRCP. Electronically Signed   By: DVinnie LangtonM.D.   On: 12/19/2021 13:53        Scheduled Meds:  finasteride  5 mg Oral Daily   heparin  5,000 Units Subcutaneous Q8H   hydrochlorothiazide  25 mg Oral Daily   lisinopril  40 mg Oral Daily   pantoprazole  40 mg Oral Daily   Continuous Infusions:  sodium chloride 75 mL/hr at 12/20/21 1230   ampicillin-sulbactam (UNASYN) IV 3 g (12/20/21 0924)     LOS: 1 day    Time spent: 23 min    Georgette Shell, MD  12/20/2021, 12:31 PM

## 2021-12-20 NOTE — Anesthesia Procedure Notes (Signed)
Procedure Name: Intubation Date/Time: 12/20/2021 3:32 PM Performed by: Gean Maidens, CRNA Pre-anesthesia Checklist: Patient identified, Emergency Drugs available, Suction available, Patient being monitored and Timeout performed Patient Re-evaluated:Patient Re-evaluated prior to induction Oxygen Delivery Method: Circle system utilized Preoxygenation: Pre-oxygenation with 100% oxygen Induction Type: IV induction Ventilation: Mask ventilation without difficulty Laryngoscope Size: Mac and 4 Grade View: Grade II Tube type: Oral Tube size: 7.5 mm Number of attempts: 1 Airway Equipment and Method: Stylet Placement Confirmation: ETT inserted through vocal cords under direct vision, positive ETCO2 and breath sounds checked- equal and bilateral Secured at: 23 cm Tube secured with: Tape Dental Injury: Teeth and Oropharynx as per pre-operative assessment

## 2021-12-20 NOTE — Consult Note (Addendum)
Consultation  Referring Provider: [facility]/ Rodena Piety MD Primary Care Physician:  Hoyt Koch, MD Primary Gastroenterologist:  Dr. Hilarie Fredrickson  Reason for Consultation:  epigastric  pain, fevers, elevated LFT's  HPI: Christian Dodson is a 76 y.o. male, known to Dr. Hilarie Fredrickson from prior colonoscopy which had been done in July 2022.  He had removal of 3 tubular adenomas and 2 hyperplastic polyps and was noted to have multiple diverticuli. Patient also with history of GERD, very remote history of ulcer disease, BPH and status post COVID-07 November 2021.  Patient has osteoarthritis and is awaiting upcoming hip replacement towards the end of January. Patient presented to the ER at to droppage yesterday with complaints of intermittent fevers over the prior few days and concerns for sinus infection.  He mentioned that he also had an episode of epigastric pain which had occurred on 12/17/2021 and lasted for several hours and resolved.  This was associated with nausea and vomiting x1, no radiation to the chest or back.  He had also noted some darker urine earlier in the week prior to the episode of epigastric pain.  Work-up in the ER with abnormal LFTs and elevated WBC.  WBC 13.8/hemoglobin 14.8/Mehta crit 42 Potassium 3.3 Creatinine 1.08 T bili 5.0/alk phos 205/AST 193/ALT 448, lipase 20 Lactate 1.5 Blood cultures pending respiratory panel negative  Upper abdominal ultrasound showed gallstones and sludge, normal gallbladder wall thickening, no pericholecystic fluid, CBD of 7.3 mm and there is a 1.1 x 0.6 cm indeterminant liver lesion as well as hepatic steatosis.  Patient transferred here for admission, he has been started on IV antibiotics/Unasyn Afebrile since admission and patient has no complaints of abdominal pain or nausea, he is hungry  Labs today WBC 9.4/hemoglobin 12.7/hematocrit 36 T bili 1.6/alk phos 172/AST 91/ALT 295 improved    Past Medical History:  Diagnosis Date   Arthritis     BPH (benign prostatic hyperplasia) 10/19/2013   GERD (gastroesophageal reflux disease)    History of stomach ulcers    age 63   Hypertension    Pneumonia    x4   Pre-diabetes    Skin cancer     Past Surgical History:  Procedure Laterality Date   APPENDECTOMY     COLONOSCOPY  2010   [place]=normal exam per pt   INGUINAL HERNIA REPAIR Right    left shoulder rotator and bicep  01/10/2017   MEDIAL PARTIAL KNEE REPLACEMENT Right 2013   TONSILLECTOMY AND ADENOIDECTOMY     TOTAL HIP ARTHROPLASTY Left 09/30/2020   Procedure: TOTAL HIP ARTHROPLASTY ANTERIOR APPROACH;  Surgeon: Paralee Cancel, MD;  Location: [facility];  Service: Orthopedics;  Laterality: Left;  70 mins   UPPER GI ENDOSCOPY      Prior to Admission medications   Medication Sig Start Date End Date Taking? Authorizing Provider  acetaminophen (TYLENOL) 650 MG CR tablet Take 650 mg by mouth See admin instructions. Take 2 tablets (1300 mg) by mouth daily at bedtime, may also take 2 tablets (1300 mg) two more times daily as needed for pain   Yes [provider]  amoxicillin (AMOXIL) 500 MG capsule Take 2,000 mg by mouth See admin instructions. Take 4 capsules (2000 mg) by mouth one hour prior to dental appointments   Yes [provider]  Ascorbic Acid (VITAMIN C PO) Take 1 tablet by mouth every morning.   Yes [provider]  cetirizine (ZYRTEC) 10 MG tablet Take 10 mg by mouth every morning.   Yes [provider]  finasteride (PROSCAR) 5 MG tablet TAKE 1 TABLET DAILY Patient taking differently: 5 mg at bedtime. 04/18/20  Yes Hoyt Koch, MD  GLUCOSAMINE-CHONDROITIN PO Take 1 tablet by mouth every morning.   Yes [provider]  hydrochlorothiazide (HYDRODIURIL) 25 MG tablet Take 1 tablet (25 mg total) by mouth daily. Annual appt due in May must see provider for future refills Patient taking differently: Take 25 mg by mouth every morning. Annual appt due in May must see provider  for future refills 11/16/21  Yes Hoyt Koch, MD  lisinopril (ZESTRIL) 40 MG tablet TAKE 1 TABLET DAILY Patient taking differently: Take 40 mg by mouth every morning. 11/16/21  Yes Hoyt Koch, MD  Multiple Vitamins-Minerals (ZINC PO) Take 1 tablet by mouth every morning.   Yes [provider]  omeprazole (PRILOSEC OTC) 20 MG tablet Take 20 mg by mouth every morning.   Yes [provider]  simvastatin (ZOCOR) 20 MG tablet Take 1 tablet (20 mg total) by mouth at bedtime. 05/06/21  Yes Hoyt Koch, MD  VITAMIN D PO Take 1 tablet by mouth every morning.   Yes [provider]  omeprazole (PRILOSEC) 20 MG capsule Take 20 mg by mouth daily. Patient not taking: Reported on 12/19/2021    [provider]    Current Facility-Administered Medications  Medication Dose Route Frequency Provider Last Rate Last Admin   0.9 %  sodium chloride infusion   Intravenous Continuous Barton Dubois, MD 75 mL/hr at 12/19/21 2147 New Bag at 12/19/21 2147   acetaminophen (TYLENOL) tablet 650 mg  650 mg Oral Q6H PRN Barton Dubois, MD   650 mg at 12/19/21 2145   Or   acetaminophen (TYLENOL) suppository 650 mg  650 mg Rectal Q6H PRN Barton Dubois, MD       Ampicillin-Sulbactam (UNASYN) 3 g in sodium chloride 0.9 % 100 mL IVPB  3 g Intravenous Q8H Barton Dubois, MD 200 mL/hr at 12/20/21 0924 3 g at 12/20/21 0924   finasteride (PROSCAR) tablet 5 mg  5 mg Oral Daily Barton Dubois, MD   5 mg at 12/19/21 2145   heparin injection 5,000 Units  5,000 Units Subcutaneous Skipper Cliche, MD   5,000 Units at 12/20/21 3664   hydrochlorothiazide (HYDRODIURIL) tablet 25 mg  25 mg Oral Daily Barton Dubois, MD       lisinopril (ZESTRIL) tablet 40 mg  40 mg Oral Daily Barton Dubois, MD       morphine 2 MG/ML injection 2 mg  2 mg Intravenous Q3H PRN Barton Dubois, MD       ondansetron [facility]) tablet 4 mg  4 mg Oral Q6H PRN Barton Dubois, MD       Or   ondansetron  [facility]) injection 4 mg  4 mg Intravenous Q6H PRN Barton Dubois, MD       pantoprazole (PROTONIX) EC tablet 40 mg  40 mg Oral Daily Barton Dubois, MD        Allergies as of 12/19/2021   (No Known Allergies)    Family History  Problem Relation Age of Onset   Colon polyps Mother    Hypertension Mother    Cancer Father        lung   Hyperlipidemia Maternal Grandfather    Heart disease Maternal Grandfather    Stroke Paternal Grandmother    Heart disease Paternal Grandmother    Hyperlipidemia Paternal Grandfather    Diabetes Neg Hx    Colon cancer Neg  Hx    Esophageal cancer Neg Hx    Rectal cancer Neg Hx    Stomach cancer Neg Hx     Social History   Socioeconomic History   Marital status: Married    Spouse name: Not on file   Number of children: 2   Years of education: Not on file   Highest education level: Not on file  Occupational History   Occupation: retired  Tobacco Use   Smoking status: Never   Smokeless tobacco: Never  Vaping Use   Vaping Use: Never used  Substance and Sexual Activity   Alcohol use: Not Currently    Comment: occassionally- once a month per pt   Drug use: No   Sexual activity: Yes  Other Topics Concern   Not on file  Social History Narrative   Not on file   Social Determinants of Health   Financial Resource Strain: Low Risk    Difficulty of Paying Living Expenses: Not hard at all  Food Insecurity: No Food Insecurity   Worried About Charity fundraiser in the Last Year: Never true   [place] in the Last Year: Never true  Transportation Needs: No Transportation Needs   Lack of Transportation (Medical): No   Lack of Transportation (Non-Medical): No  Physical Activity: Sufficiently Active   Days of Exercise per Week: 5 days   Minutes of Exercise per Session: 30 min  Stress: No Stress Concern Present   Feeling of Stress : Not at all  Social Connections: Socially Integrated   Frequency of Communication with Friends and Family:  More than three times a week   Frequency of Social Gatherings with Friends and Family: More than three times a week   Attends Religious Services: More than 4 times per year   Active Member of [employer] or Organizations: Yes   Attends Music therapist: More than 4 times per year   Marital Status: Married  Human resources officer Violence: Not on file    Review of Systems: Pertinent positive and negative review of systems were noted in the above HPI section.  All other review of systems was otherwise negative.   Physical Exam: Vital signs in last 24 hours: Temp:  [97.7 F (36.5 C)-98.7 F (37.1 C)] 97.9 F (36.6 C) (01/01 0537) Pulse Rate:  [72-104] 72 (01/01 0537) Resp:  [16-18] 16 (01/01 0537) BP: (127-152)/(56-90) 142/56 (01/01 0537) SpO2:  [97 %-99 %] 99 % (01/01 0537) Last BM Date: 12/19/21 General:   Alert, well-developed, well-nourished, older white male pleasant and cooperative in NAD wife at bedside-jaundice Head:  Normocephalic and atraumatic. Eyes:  Sclera icteric.   Conjunctiva pink. Ears:  Normal auditory acuity. Nose:  No deformity, discharge,  or lesions. Mouth:  No deformity or lesions.   Neck:  Supple; no masses or thyromegaly. Lungs:  Clear throughout to auscultation.   No wheezes, crackles, or rhonchi.  Heart:  Regular rate and rhythm; no murmurs, clicks, rubs,  or gallops. Abdomen:  Soft,nontender, BS active,nonpalp mass or hsm.   Rectal: Not done Msk:  Symmetrical without gross deformities. . Pulses:  Normal pulses noted. Extremities:  Without clubbing or edema. Neurologic:  Alert and  oriented x4;  grossly normal neurologically. Skin:  Intact without significant lesions or rashes.. Psych:  Alert and cooperative. Normal mood and affect.  Intake/Output from previous day: 12/31 0701 - 01/01 0700 In: 1732 [P.O.:120; I.V.:278.3; IV Piggyback:1333.8] Out: 500 [Urine:500] Intake/Output this shift: Total I/O In: 0  Out: 200 [  Urine:200]  Lab  Results: Recent Labs    12/19/21 1308 12/20/21 0448  WBC 13.8* 9.4  HGB 14.8 12.7*  HCT 42.3 36.2*  PLT 218 182   BMET Recent Labs    12/19/21 1308 12/20/21 0448  NA 135 138  K 3.3* 3.9  CL 96* 107  CO2 27 24  GLUCOSE 154* 151*  BUN 16 16  CREATININE 1.08 0.90  CALCIUM 10.3 9.1   LFT Recent Labs    12/20/21 0448  PROT 6.8  ALBUMIN 3.8  AST 91*  ALT 295*  ALKPHOS 172*  BILITOT 1.6*   PT/INR No results for input(s): LABPROT, INR in the last 72 hours. Hepatitis Panel No results for input(s): HEPBSAG, HCVAB, HEPAIGM, HEPBIGM in the last 72 hours.   IMPRESSION:  #53 76 year old white male with acute episode of epigastric pain on 12/17/2022, intermittent low-grade fevers at home over the past 3 to 4 days and had noted darker urine Ultrasound confirms gallstones and gallbladder sludge, no definite cholecystitis, dilated CBD at 7.3 mm, hepatic steatosis and indeterminate 1.0 x 0.6 cm liver lesion LFTs elevated with T bili 5.0 and leukocytosis  Picture is consistent with choledocholithiasis, possible early cholangitis  Labs are improved today, patient has no complaints of abdominal pain has been afebrile since admission  Rule out persistent choledocholithiasis versus passed CBD stone Also needs further evaluation of the indeterminant liver lesion  #2 GERD #3  BPH #4  Osteoarthritis-hip replacement scheduled late January #5 history of adenomatous colon polyps-up-to-date with colonoscopy just done July 2022  Plan:  n.p.o. until after MRI/MRCP today and may be okay for regular diet if no procedures to be done today.  If MRI/MRCP positive for common bile duct stone he will be scheduled for ERCP with stone extraction likely tomorrow  Continue IV Unasyn Repeat labs in a.m. Please ask surgery to see in consultation for eventual laparoscopic cholecystectomy.  Patient is concerned about the potential timing of this as he is scheduled for hip replacement late January.   Will defer to surgery  GI will follow with you   Amy Esterwood PA-C 12/20/2021, 10:05 AM     Attending Physician Note   I have taken a history, reviewed the chart and examined the patient. I personally saw the patient and performed performed more than 50% of this encounter in conjunction with the APP. I agree with the APP's note, impression and recommendations. My additional impressions and recommendations as follows.   Choledocholithiasis, cholelithiasis, R/O cholangitis. Epigastric pain, fevers, elevated LFTs. US shows cholelithiasis, mildly dilated CBD. MRCP shows CBD stones, CBD dilation to 8 mm, cholelithiasis, hepatic steatosis, large hiatal hernia, hepatic cyst.  Continue IV Unasyn. Surgical consult to consider cholecystectomy. Schedule ERCP. The risks (including pancreatitis, which can be severe, bleeding, perforation, infection, missed lesions, medication reactions and possible hospitalization or surgery if complications occur), benefits, and alternatives to endoscopy with possible biopsy and possible dilation were discussed with the patient and they consent to proceed.    Lucio Edward, MD [facility] See [facility], [facility], for our on call provider

## 2021-12-20 NOTE — Anesthesia Preprocedure Evaluation (Signed)
Anesthesia Evaluation  Patient identified by MRN, date of birth, ID band Patient awake    Reviewed: Allergy & Precautions, H&P , NPO status , Patient's Chart, lab work & pertinent test results  Airway Mallampati: II   Neck ROM: full    Dental no notable dental hx.    Pulmonary    breath sounds clear to auscultation       Cardiovascular hypertension, Pt. on medications  Rhythm:regular Rate:Normal     Neuro/Psych negative neurological ROS  negative psych ROS   GI/Hepatic Neg liver ROS, GERD  Medicated,  Endo/Other  negative endocrine ROS  Renal/GU negative Renal ROS  negative genitourinary   Musculoskeletal  (+) Arthritis ,   Abdominal Normal abdominal exam  (+)   Peds  Hematology  (+) anemia ,   Anesthesia Other Findings   Reproductive/Obstetrics                             Anesthesia Physical  Anesthesia Plan  ASA: II  Anesthesia Plan: General   Post-op Pain Management:    Induction: Intravenous  PONV Risk Score and Plan: 2 and Ondansetron  Airway Management Planned: Oral ETT  Additional Equipment: None  Intra-op Plan:   Post-operative Plan: Extubation in OR  Informed Consent: I have reviewed the patients History and Physical, chart, labs and discussed the procedure including the risks, benefits and alternatives for the proposed anesthesia with the patient or authorized representative who has indicated his/her understanding and acceptance.       Plan Discussed with: CRNA  Anesthesia Plan Comments:         Anesthesia Quick Evaluation

## 2021-12-21 ENCOUNTER — Inpatient Hospital Stay (HOSPITAL_COMMUNITY): Payer: Medicare HMO | Admitting: Anesthesiology

## 2021-12-21 ENCOUNTER — Encounter (HOSPITAL_COMMUNITY)
Admission: EM | Disposition: A | Discharge status: Home / Self Care | Payer: Self-pay | Source: Home / Self Care | Attending: Internal Medicine

## 2021-12-21 ENCOUNTER — Encounter (HOSPITAL_COMMUNITY): Payer: Self-pay | Admitting: Internal Medicine

## 2021-12-21 DIAGNOSIS — R933 Abnormal findings on diagnostic imaging of other parts of digestive tract: Secondary | ICD-10-CM

## 2021-12-21 DIAGNOSIS — R7989 Other specified abnormal findings of blood chemistry: Secondary | ICD-10-CM | POA: Diagnosis not present

## 2021-12-21 DIAGNOSIS — K8050 Calculus of bile duct without cholangitis or cholecystitis without obstruction: Secondary | ICD-10-CM

## 2021-12-21 DIAGNOSIS — K838 Other specified diseases of biliary tract: Secondary | ICD-10-CM | POA: Diagnosis not present

## 2021-12-21 HISTORY — PX: CHOLECYSTECTOMY: SHX55

## 2021-12-21 LAB — COMPREHENSIVE METABOLIC PANEL
ALT: 184 U/L — ABNORMAL HIGH (ref 0–44)
AST: 30 U/L (ref 15–41)
Albumin: 3.3 g/dL — ABNORMAL LOW (ref 3.5–5.0)
Alkaline Phosphatase: 143 U/L — ABNORMAL HIGH (ref 38–126)
Anion gap: 8 (ref 5–15)
BUN: 17 mg/dL (ref 8–23)
CO2: 23 mmol/L (ref 22–32)
Calcium: 9.1 mg/dL (ref 8.9–10.3)
Chloride: 107 mmol/L (ref 98–111)
Creatinine, Ser: 0.95 mg/dL (ref 0.61–1.24)
GFR, Estimated: >60 mL/min (ref >60)
Glucose, Bld: 152 mg/dL — ABNORMAL HIGH (ref 70–99)
Potassium: 4.4 mmol/L (ref 3.5–5.1)
Sodium: 138 mmol/L (ref 135–145)
Total Bilirubin: 1.2 mg/dL (ref 0.3–1.2)
Total Protein: 6.4 g/dL — ABNORMAL LOW (ref 6.5–8.1)

## 2021-12-21 LAB — CBC WITH DIFFERENTIAL/PLATELET
Abs Immature Granulocytes: 0.09 10*3/uL — ABNORMAL HIGH (ref 0.00–0.07)
Basophils Absolute: 0 10*3/uL (ref 0.0–0.1)
Basophils Relative: 0 %
Eosinophils Absolute: 0 10*3/uL (ref 0.0–0.5)
Eosinophils Relative: 0 %
HCT: 36.1 % — ABNORMAL LOW (ref 39.0–52.0)
Hemoglobin: 12.4 g/dL — ABNORMAL LOW (ref 13.0–17.0)
Immature Granulocytes: 1 %
Lymphocytes Relative: 13 %
Lymphs Abs: 1.1 10*3/uL (ref 0.7–4.0)
MCH: 32.2 pg (ref 26.0–34.0)
MCHC: 34.3 g/dL (ref 30.0–36.0)
MCV: 93.8 fL (ref 80.0–100.0)
Monocytes Absolute: 0.5 10*3/uL (ref 0.1–1.0)
Monocytes Relative: 6 %
Neutro Abs: 7 10*3/uL (ref 1.7–7.7)
Neutrophils Relative %: 80 %
Platelets: 206 10*3/uL (ref 150–400)
RBC: 3.85 MIL/uL — ABNORMAL LOW (ref 4.22–5.81)
RDW: 13.4 % (ref 11.5–15.5)
WBC: 8.7 10*3/uL (ref 4.0–10.5)
nRBC: 0 % (ref 0.0–0.2)

## 2021-12-21 LAB — SURGICAL PCR SCREEN
MRSA, PCR: NEGATIVE
Staphylococcus aureus: NEGATIVE

## 2021-12-21 SURGERY — LAPAROSCOPIC CHOLECYSTECTOMY WITH INTRAOPERATIVE CHOLANGIOGRAM
Anesthesia: General

## 2021-12-21 MED ORDER — HEMOSTATIC AGENTS (NO CHARGE) OPTIME
TOPICAL | Status: DC | PRN
  Administered 2021-12-21: 1 via TOPICAL

## 2021-12-21 MED ORDER — BUPIVACAINE-EPINEPHRINE (PF) 0.25% -1:200000 IJ SOLN
INTRAMUSCULAR | Status: DC | PRN
  Administered 2021-12-21: 30 mL

## 2021-12-21 MED ORDER — LACTATED RINGERS IV SOLN: INTRAVENOUS | Status: DC | PRN

## 2021-12-21 MED ORDER — ATROPINE SULFATE 0.4 MG/ML IV SOLN
INTRAVENOUS | Status: AC
  Filled 2021-12-21: qty 1

## 2021-12-21 MED ORDER — SUGAMMADEX SODIUM 200 MG/2ML IV SOLN
INTRAVENOUS | Status: DC | PRN
Start: 2021-12-21 — End: 2021-12-21
  Administered 2021-12-21: 190 mg via INTRAVENOUS

## 2021-12-21 MED ORDER — DEXAMETHASONE SODIUM PHOSPHATE 10 MG/ML IJ SOLN
INTRAMUSCULAR | Status: AC
  Filled 2021-12-21: qty 1

## 2021-12-21 MED ORDER — HYDROCODONE-ACETAMINOPHEN 5-325 MG PO TABS
1.0000 | ORAL_TABLET | Freq: Four times a day (QID) | ORAL | Status: DC | PRN
  Administered 2021-12-21 – 2021-12-22 (×3): 1 via ORAL
  Filled 2021-12-21 (×3): qty 1

## 2021-12-21 MED ORDER — ROCURONIUM BROMIDE 10 MG/ML (PF) SYRINGE
PREFILLED_SYRINGE | INTRAVENOUS | Status: AC
  Filled 2021-12-21: qty 10

## 2021-12-21 MED ORDER — ROCURONIUM BROMIDE 10 MG/ML (PF) SYRINGE
PREFILLED_SYRINGE | INTRAVENOUS | Status: DC | PRN
  Administered 2021-12-21: 45 mg via INTRAVENOUS

## 2021-12-21 MED ORDER — ONDANSETRON HCL 4 MG/2ML IJ SOLN
INTRAMUSCULAR | Status: AC
  Filled 2021-12-21: qty 2

## 2021-12-21 MED ORDER — DEXMEDETOMIDINE (PRECEDEX) IN NS 20 MCG/5ML (4 MCG/ML) IV SYRINGE
PREFILLED_SYRINGE | INTRAVENOUS | Status: AC
  Filled 2021-12-21: qty 5

## 2021-12-21 MED ORDER — ONDANSETRON HCL 4 MG/2ML IJ SOLN
INTRAMUSCULAR | Status: DC | PRN
  Administered 2021-12-21: 4 mg via INTRAVENOUS

## 2021-12-21 MED ORDER — HYDROMORPHONE HCL 1 MG/ML IJ SOLN
0.2500 mg | INTRAMUSCULAR | Status: DC | PRN
  Administered 2021-12-21: 0.5 mg via INTRAVENOUS

## 2021-12-21 MED ORDER — FENTANYL CITRATE (PF) 100 MCG/2ML IJ SOLN
INTRAMUSCULAR | Status: DC | PRN
Start: 2021-12-21 — End: 2021-12-21
  Administered 2021-12-21: 25 ug via INTRAVENOUS
  Administered 2021-12-21: 100 ug via INTRAVENOUS
  Administered 2021-12-21: 50 ug via INTRAVENOUS
  Administered 2021-12-21: 25 ug via INTRAVENOUS

## 2021-12-21 MED ORDER — DEXAMETHASONE SODIUM PHOSPHATE 10 MG/ML IJ SOLN
INTRAMUSCULAR | Status: DC | PRN
  Administered 2021-12-21: 10 mg via INTRAVENOUS

## 2021-12-21 MED ORDER — BUPIVACAINE-EPINEPHRINE (PF) 0.25% -1:200000 IJ SOLN
INTRAMUSCULAR | Status: AC
  Filled 2021-12-21: qty 30

## 2021-12-21 MED ORDER — FENTANYL CITRATE (PF) 100 MCG/2ML IJ SOLN
INTRAMUSCULAR | Status: AC
  Filled 2021-12-21: qty 2

## 2021-12-21 MED ORDER — HYDROMORPHONE HCL 1 MG/ML IJ SOLN
INTRAMUSCULAR | Status: AC
  Administered 2021-12-21: 0.5 mg via INTRAVENOUS
  Filled 2021-12-21: qty 1

## 2021-12-21 MED ORDER — PROPOFOL 10 MG/ML IV BOLUS
INTRAVENOUS | Status: DC | PRN
Start: 2021-12-21 — End: 2021-12-21
  Administered 2021-12-21: 100 mg via INTRAVENOUS

## 2021-12-21 MED ORDER — LACTATED RINGERS IR SOLN
Status: DC | PRN
  Administered 2021-12-21: 1000 mL

## 2021-12-21 MED ORDER — LIDOCAINE HCL (PF) 2 % IJ SOLN
INTRAMUSCULAR | Status: AC
  Filled 2021-12-21: qty 5

## 2021-12-21 MED ORDER — 0.9 % SODIUM CHLORIDE (POUR BTL) OPTIME
TOPICAL | Status: DC | PRN
  Administered 2021-12-21: 1000 mL

## 2021-12-21 MED ORDER — PROPOFOL 10 MG/ML IV BOLUS
INTRAVENOUS | Status: AC
  Filled 2021-12-21: qty 20

## 2021-12-21 MED ORDER — GLYCOPYRROLATE PF 0.2 MG/ML IJ SOSY
PREFILLED_SYRINGE | INTRAMUSCULAR | Status: DC | PRN
Start: 2021-12-21 — End: 2021-12-21
  Administered 2021-12-21: .2 mg via INTRAVENOUS

## 2021-12-21 MED ORDER — DEXMEDETOMIDINE (PRECEDEX) IN NS 20 MCG/5ML (4 MCG/ML) IV SYRINGE
PREFILLED_SYRINGE | INTRAVENOUS | Status: DC | PRN
  Administered 2021-12-21 (×2): 6 ug via INTRAVENOUS

## 2021-12-21 MED ORDER — LIDOCAINE 2% (20 MG/ML) 5 ML SYRINGE
INTRAMUSCULAR | Status: DC | PRN
Start: 2021-12-21 — End: 2021-12-21
  Administered 2021-12-21: 100 mg via INTRAVENOUS

## 2021-12-21 MED ORDER — CHLORHEXIDINE GLUCONATE CLOTH 2 % EX PADS
6.0000 | MEDICATED_PAD | Freq: Once | CUTANEOUS | Status: AC
  Administered 2021-12-21: 6 via TOPICAL

## 2021-12-21 SURGICAL SUPPLY — 43 items
APPLICATOR ARISTA FLEXITIP XL (MISCELLANEOUS) ×1 IMPLANT
APPLIER CLIP 5 13 M/L LIGAMAX5 (MISCELLANEOUS) ×2
BAG COUNTER SPONGE SURGICOUNT (BAG) IMPLANT
CHLORAPREP W/TINT 26 (MISCELLANEOUS) ×2 IMPLANT
CLIP APPLIE 5 13 M/L LIGAMAX5 (MISCELLANEOUS) ×1 IMPLANT
COVER SURGICAL LIGHT HANDLE (MISCELLANEOUS) ×2 IMPLANT
DECANTER SPIKE VIAL GLASS SM (MISCELLANEOUS) ×2 IMPLANT
DERMABOND ADVANCED (GAUZE/BANDAGES/DRESSINGS) ×1
DERMABOND ADVANCED .7 DNX12 (GAUZE/BANDAGES/DRESSINGS) ×1 IMPLANT
DRAPE C-ARM 42X120 X-RAY (DRAPES) IMPLANT
DRAPE SHEET LG 3/4 BI-LAMINATE (DRAPES) IMPLANT
ELECT L-HOOK LAP 45CM DISP (ELECTROSURGICAL)
ELECT PENCIL ROCKER SW 15FT (MISCELLANEOUS) ×2 IMPLANT
ELECT REM PT RETURN 15FT ADLT (MISCELLANEOUS) ×2 IMPLANT
ELECTRODE L-HOOK LAP 45CM DISP (ELECTROSURGICAL) IMPLANT
GLOVE SURG POLYISO LF SZ5.5 (GLOVE) ×2 IMPLANT
GLOVE SURG UNDER POLY LF SZ6 (GLOVE) ×2 IMPLANT
GOWN STRL REUS W/TWL LRG LVL3 (GOWN DISPOSABLE) ×2 IMPLANT
GOWN STRL REUS W/TWL XL LVL3 (GOWN DISPOSABLE) ×4 IMPLANT
GRASPER SUT TROCAR 14GX15 (MISCELLANEOUS) IMPLANT
HEMOSTAT ARISTA ABSORB 3G PWDR (HEMOSTASIS) ×1 IMPLANT
HEMOSTAT SNOW SURGICEL 2X4 (HEMOSTASIS) IMPLANT
IRRIG SUCT STRYKERFLOW 2 WTIP (MISCELLANEOUS) ×2
IRRIGATION SUCT STRKRFLW 2 WTP (MISCELLANEOUS) ×1 IMPLANT
KIT BASIN OR (CUSTOM PROCEDURE TRAY) ×2 IMPLANT
KIT TURNOVER KIT A (KITS) IMPLANT
L-HOOK LAP DISP 36CM (ELECTROSURGICAL) ×2
LHOOK LAP DISP 36CM (ELECTROSURGICAL) ×1 IMPLANT
NDL INSUFFLATION 14GA 120MM (NEEDLE) IMPLANT
NEEDLE INSUFFLATION 14GA 120MM (NEEDLE) IMPLANT
POUCH SPECIMEN RETRIEVAL 10MM (ENDOMECHANICALS) ×2 IMPLANT
SCISSORS LAP 5X35 DISP (ENDOMECHANICALS) ×2 IMPLANT
SET CHOLANGIOGRAPH MIX (MISCELLANEOUS) IMPLANT
SET TUBE SMOKE EVAC HIGH FLOW (TUBING) ×2 IMPLANT
SLEEVE XCEL OPT CAN 5 100 (ENDOMECHANICALS) ×4 IMPLANT
SUT MNCRL AB 4-0 PS2 18 (SUTURE) ×2 IMPLANT
SUT VICRYL 0 UR6 27IN ABS (SUTURE) ×1 IMPLANT
TOWEL OR 17X26 10 PK STRL BLUE (TOWEL DISPOSABLE) ×2 IMPLANT
TOWEL OR NON WOVEN STRL DISP B (DISPOSABLE) IMPLANT
TRAY LAPAROSCOPIC (CUSTOM PROCEDURE TRAY) ×2 IMPLANT
TROCAR BLADELESS OPT 5 100 (ENDOMECHANICALS) ×2 IMPLANT
TROCAR XCEL 12X100 BLDLESS (ENDOMECHANICALS) IMPLANT
TROCAR XCEL BLUNT TIP 100MML (ENDOMECHANICALS) IMPLANT

## 2021-12-21 NOTE — Anesthesia Preprocedure Evaluation (Addendum)
Anesthesia Evaluation  Patient identified by MRN, date of birth, ID band Patient awake    Reviewed: Allergy & Precautions, H&P , NPO status , Patient's Chart, lab work & pertinent test results  Airway Mallampati: II  TM Distance: >3 FB Neck ROM: Full    Dental no notable dental hx. (+) Teeth Intact, Dental Advisory Given   Pulmonary neg pulmonary ROS,    Pulmonary exam normal breath sounds clear to auscultation       Cardiovascular hypertension, Pt. on medications  Rhythm:Regular Rate:Normal     Neuro/Psych negative neurological ROS  negative psych ROS   GI/Hepatic Neg liver ROS, GERD  Medicated,  Endo/Other  negative endocrine ROS  Renal/GU negative Renal ROS  negative genitourinary   Musculoskeletal  (+) Arthritis , Osteoarthritis,    Abdominal   Peds  Hematology negative hematology ROS (+)   Anesthesia Other Findings   Reproductive/Obstetrics negative OB ROS                            Anesthesia Physical Anesthesia Plan  ASA: 2  Anesthesia Plan: General   Post-op Pain Management:    Induction: Intravenous  PONV Risk Score and Plan: 3 and Ondansetron, Dexamethasone and Treatment may vary due to age or medical condition  Airway Management Planned: Oral ETT  Additional Equipment:   Intra-op Plan:   Post-operative Plan: Extubation in OR  Informed Consent: I have reviewed the patients History and Physical, chart, labs and discussed the procedure including the risks, benefits and alternatives for the proposed anesthesia with the patient or authorized representative who has indicated his/her understanding and acceptance.     Dental advisory given  Plan Discussed with: CRNA  Anesthesia Plan Comments:         Anesthesia Quick Evaluation

## 2021-12-21 NOTE — Transfer of Care (Signed)
Immediate Anesthesia Transfer of Care Note  Patient: Christian Dodson  Procedure(s) Performed: Procedure(s): LAPAROSCOPIC CHOLECYSTECTOMY (N/A)  Patient Location: PACU  Anesthesia Type:General  Level of Consciousness:  sedated, patient cooperative and responds to stimulation  Airway & Oxygen Therapy:Patient Spontanous Breathing and Patient connected to face mask oxgen  Post-op Assessment:  Report given to PACU RN and Post -op Vital signs reviewed and stable  Post vital signs:  Reviewed and stable  Last Vitals:  Vitals:   12/21/21 0147 12/21/21 0523  BP: 132/79 133/80  Pulse: 65 73  Resp: 18 17  Temp: 36.9 C 36.5 C  SpO2: 20% 81%    Complications: No apparent anesthesia complications

## 2021-12-21 NOTE — Plan of Care (Signed)

## 2021-12-21 NOTE — Op Note (Signed)
Date: 12/21/21  Patient: Christian Dodson MRN: [id number]  Preoperative Diagnosis: Cholelithiasis, choledocholithiasis Postoperative Diagnosis: Same  Procedure: Laparoscopic cholecystectomy  Surgeon: Michaelle Birks, MD  EBL: 50 mL  Anesthesia: General  Specimens: Gallbladder  Indications: Christian Dodson is a 76 yo male who presented with epigastric pain and was found to have elevated LFTs. MRCP showed cholelithiasis with choledocholithiasis. Yesterday he underwent ERCP done extraction from the common bile duct, and his LFTs have down trended today.  Cholecystectomy was recommended and after discussion of the risks and benefits of surgery he agreed to proceed.  Findings: Cholelithiasis, no evidence of acute cholecystitis.  Procedure details: Informed consent was obtained in the preoperative area prior to the procedure. The patient was brought to the operating room and placed on the table in the supine position. General anesthesia was induced and appropriate lines and drains were placed for intraoperative monitoring. Perioperative antibiotics were administered per SCIP guidelines. The abdomen was prepped and draped in the usual sterile fashion. A pre-procedure timeout was taken verifying patient identity, surgical site and procedure to be performed.  A small supraumbilical skin incision was made, the subcutaneous tissue was divided with cautery, and the umbilical stalk was grasped and elevated. The fascia was incised and the peritoneal cavity was directly visualized. There was a loop of small bowel immediately beneath the port site but this did not appear injured. A 80mm Hassan trocar was placed. The peritoneal cavity was inspected with no evidence of visceral or vascular injury. Three 71mm ports were placed in the right subcostal margin, all under direct visualization. The fundus of the gallbladder was grasped and retracted cephalad. The infundibulum was retracted laterally. The gallbladder was  thin-walled and somewhat intrahepatic and the wall tore during retraction, spilling bile and some stones. The cystic triangle was dissected out using cautery and blunt dissection, and the critical view of safety was obtained. The cystic duct and cystic artery were clipped and ligated, leaving two clips behind on the cystic duct and cystic artery stumps. The gallbladder was taken off the liver using cautery. The specimen was placed in an endocatch bag and removed. The surgical site was irrigated with saline until the effluent was clear. Hemostasis was achieved in the gallbladder fossa using cautery. The right hepatic lobe was gently retracted anteriorly and several stones were visualized in the retrohepatic space. These were removed with suction and no other dropped stones were visualized. The cystic duct and artery stumps were visually inspected and there was no evidence of bile leak or bleeding. The gallbladder fossa appeared hemostatic and Arista was applied. The small bowel beneath the port site was run and carefully inspected and there were no signs of injury. The ports were removed under direct visualization and the abdomen was desufflated. The umbilical port site fascia was closed with a 0 vicryl suture. The skin at all port sites was closed with 4-0 monocryl subcuticular suture. Dermabond was applied.  The patient tolerated the procedure with no apparent complications.  All counts were correct x2 at the end of the procedure. The patient was extubated and taken to PACU in stable condition.  Michaelle Birks, MD 12/21/21 12:04 PM

## 2021-12-21 NOTE — Progress Notes (Signed)
Progress Note   Subjective  Feels well. No pain. No complaints.    Objective  Vital signs in last 24 hours: Temp:  [97.7 F (36.5 C)-98.9 F (37.2 C)] 97.7 F (36.5 C) (01/02 0523) Pulse Rate:  [65-87] 73 (01/02 0523) Resp:  [11-20] 17 (01/02 0523) BP: (131-168)/(71-97) 133/80 (01/02 0523) SpO2:  [96 %-100 %] 97 % (01/02 0523) Weight:  [75.3 kg] 75.3 kg (01/01 1456) Last BM Date: 12/20/21  General: Alert, well-developed, in NAD Heart:  Regular rate and rhythm; no murmurs Chest: Clear to ascultation bilaterally Abdomen:  Soft, nontender and nondistended. Normal bowel sounds, without guarding, and without rebound.   Extremities:  Without edema. Neurologic:  Alert and  oriented x4; grossly normal neurologically. Psych:  Alert and cooperative. Normal mood and affect.  Intake/Output from previous day: 01/01 0701 - 01/02 0700 In: 2701.2 [P.O.:360; I.V.:2041.3; IV Piggyback:299.9] Out: 2300 [Urine:2300] Intake/Output this shift: No intake/output data recorded.  Lab Results: Recent Labs    12/19/21 1308 12/20/21 0448 12/21/21 0432  WBC 13.8* 9.4 8.7  HGB 14.8 12.7* 12.4*  HCT 42.3 36.2* 36.1*  PLT 218 182 206   BMET Recent Labs    12/19/21 1308 12/20/21 0448 12/21/21 0432  NA 135 138 138  K 3.3* 3.9 4.4  CL 96* 107 107  CO2 27 24 23   GLUCOSE 154* 151* 152*  BUN 16 16 17   CREATININE 1.08 0.90 0.95  CALCIUM 10.3 9.1 9.1   LFT Recent Labs    12/21/21 0432  PROT 6.4*  ALBUMIN 3.3*  AST 30  ALT 184*  ALKPHOS 143*  BILITOT 1.2   PT/INR No results for input(s): LABPROT, INR in the last 72 hours. Hepatitis Panel No results for input(s): HEPBSAG, HCVAB, HEPAIGM, HEPBIGM in the last 72 hours.  Studies/Results: MR 3D Recon At Scanner  Result Date: 12/20/2021 CLINICAL DATA:  76 year old male with history of dilated common bile duct on recent ultrasound examination. Evaluate for choledocholithiasis. EXAM: MRI ABDOMEN WITH CONTRAST (WITH MRCP) TECHNIQUE:  Multiplanar multisequence MR imaging of the abdomen was performed following the administration of intravenous contrast. Heavily T2-weighted images of the biliary and pancreatic ducts were obtained, and three-dimensional MRCP images were rendered by post processing. CONTRAST:  7.46mL GADAVIST GADOBUTROL 1 MMOL/ML IV SOLN COMPARISON:  No prior abdominal MRI. Abdominal ultrasound 12/19/2021. FINDINGS: Lower chest: Large hiatal hernia. Hepatobiliary: Mild diffuse loss of signal intensity throughout the hepatic parenchyma on out of phase dual echo images, indicative of hepatic steatosis. 9 mm T1 hypointense, T2 hyperintense, nonenhancing lesion in segment 7 of the liver, compatible with a simple cyst. No other suspicious hepatic lesions are noted. Numerous small filling defects lie dependently in the gallbladder, compatible with gallstones and/or biliary sludge. Gallbladder is moderately distended. Gallbladder wall thickness is normal. No pericholecystic fluid. Common bile duct is mildly dilated measuring 8 mm. Tiny filling defects within the common bile duct, compatible with choledocholithiasis, largest of which measures 4 mm in the mid common bile duct (coronal T2 weighted image 17 of series 5). Pancreas: No pancreatic mass. No pancreatic ductal dilatation. No pancreatic or peripancreatic fluid collections or inflammatory changes. Spleen:  Unremarkable. Adrenals/Urinary Tract: Bilateral kidneys and bilateral adrenal glands are normal in appearance. No hydroureteronephrosis in the visualized portions of the abdomen. Stomach/Bowel: Visualized portions are unremarkable. Vascular/Lymphatic: Aortic atherosclerosis, without evidence of aneurysm in the abdominal vasculature. No lymphadenopathy noted in the abdomen. Other: No significant volume of ascites noted in the visualized portions of the peritoneal cavity. Musculoskeletal:  No aggressive appearing osseous lesions are noted in the visualized portions of the skeleton.  IMPRESSION: 1. Choledocholithiasis with mild common bile duct dilatation, indicating mild biliary tract obstruction. No intrahepatic biliary ductal dilatation. 2. Cholelithiasis without evidence of acute cholecystitis. 3. Hepatic steatosis. 4. Large hiatal hernia. Electronically Signed   By: Vinnie Langton M.D.   On: 12/20/2021 12:22   CT Maxillofacial W Contrast  Result Date: 12/19/2021 CLINICAL DATA:  Headaches. Intermittent fevers. Concern for recurrent sinus infection. EXAM: CT MAXILLOFACIAL WITH CONTRAST TECHNIQUE: Multidetector CT imaging of the maxillofacial structures was performed with intravenous contrast. Multiplanar CT image reconstructions were also generated. CONTRAST:  6mL OMNIPAQUE IOHEXOL 300 MG/ML  SOLN COMPARISON:  None. FINDINGS: Osseous: No focal osseous lesions or fracture. No significant dental disease. Orbits: The globes and orbits are within normal limits. Sinuses: The paranasal sinuses and mastoid air cells are clear. Soft tissues: Soft tissues the face are unremarkable. Atherosclerotic changes are present at the carotid bifurcations, right greater than left without definite stenosis. Limited intracranial: Unremarkable. IMPRESSION: Negative CT of the face.  No significant sinus disease. Electronically Signed   By: San Morelle M.D.   On: 12/19/2021 15:15   DG Chest Portable 1 View  Result Date: 12/19/2021 CLINICAL DATA:  76 year old male with history of fevers and cough. EXAM: PORTABLE CHEST 1 VIEW COMPARISON:  No priors. FINDINGS: Lung volumes are normal. Diffuse peribronchial cuffing. No consolidative airspace disease. No pleural effusions. No pneumothorax. No pulmonary nodule or mass noted. Pulmonary vasculature and the cardiomediastinal silhouette are within normal limits. IMPRESSION: 1. Diffuse peribronchial cuffing concerning for an acute bronchitis. Electronically Signed   By: Vinnie Langton M.D.   On: 12/19/2021 14:18   MR ABDOMEN WITH MRCP W  CONTRAST  Result Date: 12/20/2021 CLINICAL DATA:  76 year old male with history of dilated common bile duct on recent ultrasound examination. Evaluate for choledocholithiasis. EXAM: MRI ABDOMEN WITH CONTRAST (WITH MRCP) TECHNIQUE: Multiplanar multisequence MR imaging of the abdomen was performed following the administration of intravenous contrast. Heavily T2-weighted images of the biliary and pancreatic ducts were obtained, and three-dimensional MRCP images were rendered by post processing. CONTRAST:  7.37mL GADAVIST GADOBUTROL 1 MMOL/ML IV SOLN COMPARISON:  No prior abdominal MRI. Abdominal ultrasound 12/19/2021. FINDINGS: Lower chest: Large hiatal hernia. Hepatobiliary: Mild diffuse loss of signal intensity throughout the hepatic parenchyma on out of phase dual echo images, indicative of hepatic steatosis. 9 mm T1 hypointense, T2 hyperintense, nonenhancing lesion in segment 7 of the liver, compatible with a simple cyst. No other suspicious hepatic lesions are noted. Numerous small filling defects lie dependently in the gallbladder, compatible with gallstones and/or biliary sludge. Gallbladder is moderately distended. Gallbladder wall thickness is normal. No pericholecystic fluid. Common bile duct is mildly dilated measuring 8 mm. Tiny filling defects within the common bile duct, compatible with choledocholithiasis, largest of which measures 4 mm in the mid common bile duct (coronal T2 weighted image 17 of series 5). Pancreas: No pancreatic mass. No pancreatic ductal dilatation. No pancreatic or peripancreatic fluid collections or inflammatory changes. Spleen:  Unremarkable. Adrenals/Urinary Tract: Bilateral kidneys and bilateral adrenal glands are normal in appearance. No hydroureteronephrosis in the visualized portions of the abdomen. Stomach/Bowel: Visualized portions are unremarkable. Vascular/Lymphatic: Aortic atherosclerosis, without evidence of aneurysm in the abdominal vasculature. No lymphadenopathy noted  in the abdomen. Other: No significant volume of ascites noted in the visualized portions of the peritoneal cavity. Musculoskeletal: No aggressive appearing osseous lesions are noted in the visualized portions of the skeleton. IMPRESSION: 1.  Choledocholithiasis with mild common bile duct dilatation, indicating mild biliary tract obstruction. No intrahepatic biliary ductal dilatation. 2. Cholelithiasis without evidence of acute cholecystitis. 3. Hepatic steatosis. 4. Large hiatal hernia. Electronically Signed   By: Vinnie Langton M.D.   On: 12/20/2021 12:22   US Abdomen Limited RUQ (LIVER/GB)  Result Date: 12/19/2021 CLINICAL DATA:  76 year old male with history of abdominal pain and nausea. EXAM: ULTRASOUND ABDOMEN LIMITED RIGHT UPPER QUADRANT COMPARISON:  No priors. FINDINGS: Gallbladder: There is amorphous material and some small echogenic foci with posterior acoustic shadowing lying dependently in the gallbladder, compatible with a combination of biliary sludge and tiny gallstones. Gallbladder is only moderately distended. Gallbladder wall thickness is normal at 3.3 mm. No pericholecystic fluid. Per report from the sonographer, there was no sonographic Murphy's sign on examination. Common bile duct: Diameter: 7.3 mm Liver: Tiny hypoechoic lesion in the left lobe of the liver measuring 1.1 x 0.6 x 0.8 cm, indeterminate. Generalized increased echogenicity throughout the hepatic parenchyma, suggestive of hepatic steatosis. Portal vein is patent on color Doppler imaging with normal direction of blood flow towards the liver. Other: None. IMPRESSION: 1. Cholelithiasis and biliary sludge in the gallbladder, without evidence of acute cholecystitis at this time. 2. Mild prominence of the common bile duct measuring 7.3 mm in the porta hepatis. If there is clinical concern for choledocholithiasis or other cause of biliary obstruction, further evaluation with abdominal MRI with and without IV gadolinium with MRCP  should be considered. 3. Hepatic steatosis. 4. Small indeterminate lesion in the left lobe of the liver. This too could be further evaluated with follow-up abdominal MRI with and without IV gadolinium with MRCP. Electronically Signed   By: Vinnie Langton M.D.   On: 12/19/2021 13:53      Assessment & Recommendations   Choledocholithiasis: post ERCP sphincterotomy with stone extraction. LFTs improving.  Repeat LFTs as outpatient in 1 month.  Cholelithiasis. [facility] has seen patient and lap chole is planned.   GI signing off. Outpatient GI follow up with Dr. Hilarie Fredrickson as needed.     LOS: 2 days   Norberto Sorenson T. Fuller Plan MD 12/21/2021, 9:02 AM See Shea Evans, [facility], to contact our on call provider

## 2021-12-21 NOTE — Consult Note (Signed)
[place] March 23, 1946  [id number].    Requesting MD: Dr. Fuller Plan Chief Complaint/Reason for Consult: choledocholithiasis  HPI:  Christian Dodson is a 76 yo male well-known to the ED a few days ago with epigastric abdominal pain.  Prior to that he had noticed darkening of his urine, which was associated with fevers and chills.  Labs were significant for elevated LFTs, with a T bili of 5.  He had an MRCP, which showed stones in the distal common bile duct with mild CBD dilation.  He also had cholelithiasis with no signs of acute cholecystitis.  He was admitted and yesterday underwent an ERCP with extraction of a stone from the common bile duct.  Today his LFTs are downtrending and bilirubin is close to normal.  He has not had any abdominal pain in the last few days.  Prior abdominal surgeries include an open appendectomy and a right inguinal hernia repair.  ROS: Review of Systems  Constitutional:  Negative for chills and fever.  HENT:  Negative for sore throat.   Respiratory:  Negative for shortness of breath, wheezing and stridor.   Cardiovascular:  Negative for chest pain.  Gastrointestinal:  Positive for abdominal pain and constipation.  Neurological:  Negative for seizures and loss of consciousness.   Family History  Problem Relation Age of Onset   Colon polyps Mother    Hypertension Mother    Cancer Father        lung   Hyperlipidemia Maternal Grandfather    Heart disease Maternal Grandfather    Stroke Paternal Grandmother    Heart disease Paternal Grandmother    Hyperlipidemia Paternal Grandfather    Diabetes Neg Hx    Colon cancer Neg Hx    Esophageal cancer Neg Hx    Rectal cancer Neg Hx    Stomach cancer Neg Hx     Past Medical History:  Diagnosis Date   Arthritis    BPH (benign prostatic hyperplasia) 10/19/2013   GERD (gastroesophageal reflux disease)    History of stomach ulcers    age 52   Hypertension    Pneumonia    x4   Pre-diabetes    Skin cancer     Past  Surgical History:  Procedure Laterality Date   APPENDECTOMY     COLONOSCOPY  2010   [place]=normal exam per pt   INGUINAL HERNIA REPAIR Right    left shoulder rotator and bicep  01/10/2017   MEDIAL PARTIAL KNEE REPLACEMENT Right 2013   TONSILLECTOMY AND ADENOIDECTOMY     TOTAL HIP ARTHROPLASTY Left 09/30/2020   Procedure: TOTAL HIP ARTHROPLASTY ANTERIOR APPROACH;  Surgeon: Paralee Cancel, MD;  Location: [facility];  Service: Orthopedics;  Laterality: Left;  70 mins   UPPER GI ENDOSCOPY      Social History:  reports that he has never smoked. He has never used smokeless tobacco. He reports that he does not currently use alcohol. He reports that he does not use drugs.  Allergies: No Known Allergies  Medications Prior to Admission  Medication Sig Dispense Refill   acetaminophen (TYLENOL) 650 MG CR tablet Take 650 mg by mouth See admin instructions. Take 2 tablets (1300 mg) by mouth daily at bedtime, may also take 2 tablets (1300 mg) two more times daily as needed for pain     amoxicillin (AMOXIL) 500 MG capsule Take 2,000 mg by mouth See admin instructions. Take 4 capsules (2000 mg) by mouth one hour prior to dental appointments     Ascorbic Acid (  VITAMIN C PO) Take 1 tablet by mouth every morning.     cetirizine (ZYRTEC) 10 MG tablet Take 10 mg by mouth every morning.     finasteride (PROSCAR) 5 MG tablet TAKE 1 TABLET DAILY (Patient taking differently: 5 mg at bedtime.) 90 tablet 2   GLUCOSAMINE-CHONDROITIN PO Take 1 tablet by mouth every morning.     hydrochlorothiazide (HYDRODIURIL) 25 MG tablet Take 1 tablet (25 mg total) by mouth daily. Annual appt due in May must see provider for future refills (Patient taking differently: Take 25 mg by mouth every morning. Annual appt due in May must see provider for future refills) 90 tablet 2   lisinopril (ZESTRIL) 40 MG tablet TAKE 1 TABLET DAILY (Patient taking differently: Take 40 mg by mouth every morning.) 90 tablet 2   Multiple Vitamins-Minerals  (ZINC PO) Take 1 tablet by mouth every morning.     omeprazole (PRILOSEC OTC) 20 MG tablet Take 20 mg by mouth every morning.     simvastatin (ZOCOR) 20 MG tablet Take 1 tablet (20 mg total) by mouth at bedtime. 90 tablet 3   VITAMIN D PO Take 1 tablet by mouth every morning.     omeprazole (PRILOSEC) 20 MG capsule Take 20 mg by mouth daily. (Patient not taking: Reported on 12/19/2021)       Physical Exam: Blood pressure 133/80, pulse 73, temperature 97.7 F (36.5 C), temperature source Oral, resp. rate 17, height 5\' 6"  (1.676 m), weight 75.3 kg, SpO2 97 %. General: resting comfortably, appears stated age, no apparent distress Neurological: alert and oriented, no focal deficits, cranial nerves grossly in tact HEENT: normocephalic, atraumatic, oropharynx clear, no scleral icterus CV: extremities warm and well-perfused Respiratory: normal work of breathing on room air, symmetric chest wall expansion Abdomen: soft, nondistended, nontender to deep palpation. No masses or organomegaly.  Well-healed RLQ surgical scar. Extremities: warm and well-perfused, no deformities, moving all extremities spontaneously Psychiatric: normal mood and affect Skin: warm and dry, no jaundice, no rashes or lesions   Results for orders placed or performed during the hospital encounter of 12/19/21 (from the past 48 hour(s))  CBC with Differential     Status: Abnormal   Collection Time: 12/19/21  1:08 PM  Result Value Ref Range   WBC 13.8 (H) 4.0 - 10.5 K/uL   RBC 4.64 4.22 - 5.81 MIL/uL   Hemoglobin 14.8 13.0 - 17.0 g/dL   HCT 42.3 39.0 - 52.0 %   MCV 91.2 80.0 - 100.0 fL   MCH 31.9 26.0 - 34.0 pg   MCHC 35.0 30.0 - 36.0 g/dL   RDW 13.8 11.5 - 15.5 %   Platelets 218 150 - 400 K/uL   nRBC 0.0 0.0 - 0.2 %   Neutrophils Relative % 81 %   Neutro Abs 11.1 (H) 1.7 - 7.7 K/uL   Lymphocytes Relative 12 %   Lymphs Abs 1.7 0.7 - 4.0 K/uL   Monocytes Relative 6 %   Monocytes Absolute 0.9 0.1 - 1.0 K/uL    Eosinophils Relative 1 %   Eosinophils Absolute 0.1 0.0 - 0.5 K/uL   Basophils Relative 0 %   Basophils Absolute 0.0 0.0 - 0.1 K/uL   Immature Granulocytes 0 %   Abs Immature Granulocytes 0.06 0.00 - 0.07 K/uL    Comment: Performed at [facility], [address] [place], [place] [address]  Comprehensive metabolic panel     Status: Abnormal   Collection Time: 12/19/21  1:08 PM  Result Value Ref  Range   Sodium 135 135 - 145 mmol/L   Potassium 3.3 (L) 3.5 - 5.1 mmol/L   Chloride 96 (L) 98 - 111 mmol/L   CO2 27 22 - 32 mmol/L   Glucose, Bld 154 (H) 70 - 99 mg/dL    Comment: Glucose reference range applies only to samples taken after fasting for at least 8 hours.   BUN 16 8 - 23 mg/dL   Creatinine, Ser 1.08 0.61 - 1.24 mg/dL   Calcium 10.3 8.9 - 10.3 mg/dL   Total Protein 7.9 6.5 - 8.1 g/dL   Albumin 4.6 3.5 - 5.0 g/dL   AST 193 (H) 15 - 41 U/L   ALT 448 (H) 0 - 44 U/L   Alkaline Phosphatase 205 (H) 38 - 126 U/L   Total Bilirubin 5.0 (H) 0.3 - 1.2 mg/dL   GFR, Estimated >60 >60 mL/min    Comment: (NOTE) Calculated using the CKD-EPI Creatinine Equation (2021)    Anion gap 12 5 - 15    Comment: Performed at [facility], [place], [place] [address]  Lipase, blood     Status: None   Collection Time: 12/19/21  1:08 PM  Result Value Ref Range   Lipase 20 11 - 51 U/L    Comment: Performed at [facility], [address] [place], [place] [address]  Lactic acid, plasma     Status: None   Collection Time: 12/19/21  1:08 PM  Result Value Ref Range   Lactic Acid, Venous 1.5 0.5 - 1.9 mmol/L    Comment: Performed at [facility], [address], [place], [place] [address]  Lactic acid, plasma     Status: None   Collection Time: 12/19/21  1:08 PM  Result Value Ref Range   Lactic Acid, Venous 1.0 0.5 - 1.9 mmol/L    Comment: Performed at [facility], [place], [place] [address]  Urinalysis, Routine w reflex microscopic Urine, Clean Catch     Status: Abnormal   Collection Time: 12/19/21  1:08 PM  Result Value Ref Range   Color, Urine YELLOW YELLOW   APPearance CLEAR CLEAR   Specific Gravity, Urine 1.009 1.005 - 1.030   pH 5.5 5.0 - 8.0   Glucose, UA 100 (A) NEGATIVE mg/dL   Hgb urine dipstick NEGATIVE NEGATIVE   Bilirubin Urine SMALL (A) NEGATIVE   Ketones, ur NEGATIVE NEGATIVE mg/dL   Protein, ur NEGATIVE NEGATIVE mg/dL   Nitrite NEGATIVE NEGATIVE   Leukocytes,Ua NEGATIVE NEGATIVE    Comment: Performed at [facility], [address], [place], [place] [address]  Urine Culture     Status: Abnormal   Collection Time: 12/19/21  1:08 PM   Specimen: Urine, Clean Catch  Result Value Ref Range   Specimen Description      URINE, CLEAN CATCH Performed at [facility], [address] [place], [place] [address]    Special Requests      NONE Performed at [facility], [address] [place], [place] [address]    Culture MULTIPLE SPECIES PRESENT, SUGGEST RECOLLECTION (A)    Report Status 12/20/2021 FINAL   Resp Panel by RT-PCR (Flu A&B, Covid) Urine, Clean Catch     Status: None   Collection Time: 12/19/21  1:08 PM   Specimen: Urine, Clean Catch; Nasopharyngeal(NP) swabs in vial transport medium  Result Value Ref Range   SARS Coronavirus 2 by RT PCR NEGATIVE NEGATIVE    Comment: (NOTE)  SARS-CoV-2 target nucleic acids are NOT DETECTED.  The SARS-CoV-2 RNA is generally detectable in upper respiratory specimens during the acute phase of infection. The lowest concentration of SARS-CoV-2 viral copies this assay can detect is 138 copies/mL. A negative result does not preclude SARS-Cov-2 infection and should not be used as the sole basis for treatment or other patient management decisions. A negative result may occur with  improper specimen collection/handling, submission of specimen  other than nasopharyngeal swab, presence of viral mutation(s) within the areas targeted by this assay, and inadequate number of viral copies(<138 copies/mL). A negative result must be combined with clinical observations, patient history, and epidemiological information. The expected result is Negative.  Fact Sheet for Patients:  EntrepreneurPulse.com.au  Fact Sheet for Healthcare Providers:  IncredibleEmployment.be  This test is no t yet approved or cleared by the [place] FDA and  has been authorized for detection and/or diagnosis of SARS-CoV-2 by FDA under an Emergency Use Authorization (EUA). This EUA will remain  in effect (meaning this test can be used) for the duration of the COVID-19 declaration under Section 564(b)(1) of the Act, 21 U.S.C.section 360bbb-3(b)(1), unless the authorization is terminated  or revoked sooner.       Influenza A by PCR NEGATIVE NEGATIVE   Influenza B by PCR NEGATIVE NEGATIVE    Comment: (NOTE) The Xpert Xpress SARS-CoV-2/FLU/RSV plus assay is intended as an aid in the diagnosis of influenza from Nasopharyngeal swab specimens and should not be used as a sole basis for treatment. Nasal washings and aspirates are unacceptable for Xpert Xpress SARS-CoV-2/FLU/RSV testing.  Fact Sheet for Patients: EntrepreneurPulse.com.au  Fact Sheet for Healthcare Providers: IncredibleEmployment.be  This test is not yet approved or cleared by the [place] FDA and has been authorized for detection and/or diagnosis of SARS-CoV-2 by FDA under an Emergency Use Authorization (EUA). This EUA will remain in effect (meaning this test can be used) for the duration of the COVID-19 declaration under Section 564(b)(1) of the Act, 21 U.S.C. section 360bbb-3(b)(1), unless the authorization is terminated or revoked.  Performed at [facility], [address] [place], [place] [address]   Blood culture (routine x 2)     Status: None (Preliminary result)   Collection Time: 12/19/21  3:39 PM   Specimen: BLOOD  Result Value Ref Range   Specimen Description      BLOOD RIGHT ANTECUBITAL Performed at [facility], [address] [place], [place] [address]    Special Requests      BOTTLES DRAWN AEROBIC AND ANAEROBIC Blood Culture results may not be optimal due to an excessive volume of blood received in culture bottles Performed at [facility], [address], [place], [place] [address]    Culture      NO GROWTH 2 DAYS Performed at [facility], [place] [address], [place], [place] [address]    Report Status PENDING   Blood culture (routine x 2)     Status: None (Preliminary result)   Collection Time: 12/19/21  4:00 PM   Specimen: BLOOD  Result Value Ref Range   Specimen Description      BLOOD BLOOD RIGHT HAND Performed at [facility], [address] [place],  [address]    Special Requests      BOTTLES DRAWN AEROBIC AND ANAEROBIC Blood Culture results may not be optimal due to an excessive volume of blood received in culture bottles Performed at [facility], [address] [place], [place]  [address]    Culture      NO GROWTH 2 DAYS Performed at [facility], [place] [address] [place], [place] [address]    Report Status PENDING   Comprehensive metabolic panel     Status: Abnormal   Collection Time: 12/20/21  4:48 AM  Result Value Ref Range   Sodium 138 135 - 145 mmol/L   Potassium 3.9 3.5 - 5.1 mmol/L   Chloride 107 98 - 111 mmol/L   CO2 24 22 - 32 mmol/L   Glucose, Bld 151 (H) 70 - 99 mg/dL    Comment: Glucose reference range applies only to samples taken after fasting for at least 8 hours.   BUN 16 8 - 23 mg/dL   Creatinine, Ser 0.90 0.61 - 1.24 mg/dL   Calcium 9.1 8.9 - 10.3 mg/dL   Total Protein 6.8 6.5 - 8.1 g/dL   Albumin  3.8 3.5 - 5.0 g/dL   AST 91 (H) 15 - 41 U/L   ALT 295 (H) 0 - 44 U/L   Alkaline Phosphatase 172 (H) 38 - 126 U/L   Total Bilirubin 1.6 (H) 0.3 - 1.2 mg/dL   GFR, Estimated >60 >60 mL/min    Comment: (NOTE) Calculated using the CKD-EPI Creatinine Equation (2021)    Anion gap 7 5 - 15    Comment: Performed at [facility], [place] [address], [place], [place] [address]  CBC     Status: Abnormal   Collection Time: 12/20/21  4:48 AM  Result Value Ref Range   WBC 9.4 4.0 - 10.5 K/uL   RBC 3.89 (L) 4.22 - 5.81 MIL/uL   Hemoglobin 12.7 (L) 13.0 - 17.0 g/dL   HCT 36.2 (L) 39.0 - 52.0 %   MCV 93.1 80.0 - 100.0 fL   MCH 32.6 26.0 - 34.0 pg   MCHC 35.1 30.0 - 36.0 g/dL   RDW 14.0 11.5 - 15.5 %   Platelets 182 150 - 400 K/uL   nRBC 0.0 0.0 - 0.2 %    Comment: Performed at [facility], [place] [address], [place], [place] [address]  CBC with Differential     Status: Abnormal   Collection Time: 12/21/21  4:32 AM  Result Value Ref Range   WBC 8.7 4.0 - 10.5 K/uL   RBC 3.85 (L) 4.22 - 5.81 MIL/uL   Hemoglobin 12.4 (L) 13.0 - 17.0 g/dL   HCT 36.1 (L) 39.0 - 52.0 %   MCV 93.8 80.0 - 100.0 fL   MCH 32.2 26.0 - 34.0 pg   MCHC 34.3 30.0 - 36.0 g/dL   RDW 13.4 11.5 - 15.5 %   Platelets 206 150 - 400 K/uL   nRBC 0.0 0.0 - 0.2 %   Neutrophils Relative % 80 %   Neutro Abs 7.0 1.7 - 7.7 K/uL   Lymphocytes Relative 13 %   Lymphs Abs 1.1 0.7 - 4.0 K/uL   Monocytes Relative 6 %   Monocytes Absolute 0.5 0.1 - 1.0 K/uL   Eosinophils Relative 0 %   Eosinophils Absolute 0.0 0.0 - 0.5 K/uL   Basophils Relative 0 %   Basophils Absolute 0.0 0.0 - 0.1 K/uL   Immature Granulocytes 1 %   Abs Immature Granulocytes 0.09 (H) 0.00 - 0.07 K/uL    Comment: Performed at [facility], [place] [address], [place], [place] [address]  Comprehensive metabolic panel     Status: Abnormal   Collection Time: 12/21/21  4:32 AM  Result Value Ref Range   Sodium 138 135 - 145  mmol/L   Potassium 4.4 3.5 - 5.1 mmol/L   Chloride 107 98 - 111 mmol/L   CO2 23 22 - 32 mmol/L   Glucose, Bld 152 (H) 70 - 99 mg/dL    Comment: Glucose reference range applies only to samples taken after fasting for at least 8 hours.   BUN 17 8 - 23 mg/dL   Creatinine, Ser 0.95 0.61 - 1.24 mg/dL   Calcium 9.1 8.9 - 10.3 mg/dL   Total Protein 6.4 (L) 6.5 - 8.1 g/dL   Albumin 3.3 (L) 3.5 - 5.0 g/dL   AST 30 15 - 41 U/L   ALT 184 (H) 0 - 44 U/L   Alkaline Phosphatase 143 (H) 38 - 126 U/L   Total Bilirubin 1.2 0.3 - 1.2 mg/dL   GFR, Estimated >60 >60 mL/min    Comment: (NOTE) Calculated using the CKD-EPI Creatinine Equation (2021)    Anion gap 8 5 - 15    Comment: Performed at [facility], [place] [address], [place], [place] [address]   MR 3D Recon At Scanner  Result Date: 12/20/2021 CLINICAL DATA:  76 year old male with history of dilated common bile duct on recent ultrasound examination. Evaluate for choledocholithiasis. EXAM: MRI ABDOMEN WITH CONTRAST (WITH MRCP) TECHNIQUE: Multiplanar multisequence MR imaging of the abdomen was performed following the administration of intravenous contrast. Heavily T2-weighted images of the biliary and pancreatic ducts were obtained, and three-dimensional MRCP images were rendered by post processing. CONTRAST:  7.38mL GADAVIST GADOBUTROL 1 MMOL/ML IV SOLN COMPARISON:  No prior abdominal MRI. Abdominal ultrasound 12/19/2021. FINDINGS: Lower chest: Large hiatal hernia. Hepatobiliary: Mild diffuse loss of signal intensity throughout the hepatic parenchyma on out of phase dual echo images, indicative of hepatic steatosis. 9 mm T1 hypointense, T2 hyperintense, nonenhancing lesion in segment 7 of the liver, compatible with a simple cyst. No other suspicious hepatic lesions are noted. Numerous small filling defects lie dependently in the gallbladder, compatible with gallstones and/or biliary sludge. Gallbladder is moderately distended. Gallbladder  wall thickness is normal. No pericholecystic fluid. Common bile duct is mildly dilated measuring 8 mm. Tiny filling defects within the common bile duct, compatible with choledocholithiasis, largest of which measures 4 mm in the mid common bile duct (coronal T2 weighted image 17 of series 5). Pancreas: No pancreatic mass. No pancreatic ductal dilatation. No pancreatic or peripancreatic fluid collections or inflammatory changes. Spleen:  Unremarkable. Adrenals/Urinary Tract: Bilateral kidneys and bilateral adrenal glands are normal in appearance. No hydroureteronephrosis in the visualized portions of the abdomen. Stomach/Bowel: Visualized portions are unremarkable. Vascular/Lymphatic: Aortic atherosclerosis, without evidence of aneurysm in the abdominal vasculature. No lymphadenopathy noted in the abdomen. Other: No significant volume of ascites noted in the visualized portions of the peritoneal cavity. Musculoskeletal: No aggressive appearing osseous lesions are noted in the visualized portions of the skeleton. IMPRESSION: 1. Choledocholithiasis with mild common bile duct dilatation, indicating mild biliary tract obstruction. No intrahepatic biliary ductal dilatation. 2. Cholelithiasis without evidence of acute cholecystitis. 3. Hepatic steatosis. 4. Large hiatal hernia. Electronically Signed   By: Vinnie Langton M.D.   On: 12/20/2021 12:22   CT Maxillofacial W Contrast  Result Date: 12/19/2021 CLINICAL DATA:  Headaches. Intermittent fevers. Concern for recurrent sinus infection. EXAM: CT MAXILLOFACIAL WITH CONTRAST TECHNIQUE: Multidetector CT imaging of the maxillofacial structures was performed with intravenous contrast. Multiplanar CT image reconstructions were also generated. CONTRAST:  11mL OMNIPAQUE IOHEXOL 300 MG/ML  SOLN COMPARISON:  None. FINDINGS: Osseous: No focal osseous lesions or fracture. No significant dental disease. Orbits: The globes and orbits are within normal limits. Sinuses: The  paranasal sinuses and mastoid air cells are clear. Soft tissues: Soft tissues the face are unremarkable. Atherosclerotic changes are present at the carotid bifurcations, right greater than left without definite stenosis. Limited intracranial: Unremarkable. IMPRESSION: Negative CT of the face.  No significant sinus disease. Electronically Signed   By: San Morelle M.D.   On: 12/19/2021 15:15   DG Chest Portable 1 View  Result Date: 12/19/2021 CLINICAL DATA:  76 year old male with history of fevers and cough. EXAM: PORTABLE CHEST 1 VIEW COMPARISON:  No priors. FINDINGS: Lung volumes are normal. Diffuse peribronchial cuffing. No consolidative airspace disease. No pleural effusions. No pneumothorax. No pulmonary nodule or mass noted. Pulmonary vasculature and the cardiomediastinal silhouette are within normal limits. IMPRESSION: 1. Diffuse peribronchial cuffing concerning for an acute bronchitis. Electronically Signed   By: Vinnie Langton M.D.   On: 12/19/2021 14:18   MR ABDOMEN WITH MRCP W CONTRAST  Result Date: 12/20/2021 CLINICAL DATA:  76 year old male with history of dilated common bile duct on recent ultrasound examination. Evaluate for choledocholithiasis. EXAM: MRI ABDOMEN WITH CONTRAST (WITH MRCP) TECHNIQUE: Multiplanar multisequence MR imaging of the abdomen was performed following the administration of intravenous contrast. Heavily T2-weighted images of the biliary and pancreatic ducts were obtained, and three-dimensional MRCP images were rendered by post processing. CONTRAST:  7.7mL GADAVIST GADOBUTROL 1 MMOL/ML IV SOLN COMPARISON:  No prior abdominal MRI. Abdominal ultrasound 12/19/2021. FINDINGS: Lower chest: Large hiatal hernia. Hepatobiliary: Mild diffuse loss of signal intensity throughout the hepatic parenchyma on out of phase dual echo images, indicative of hepatic steatosis. 9 mm T1 hypointense, T2 hyperintense, nonenhancing lesion in segment 7 of the liver, compatible with a simple  cyst. No other suspicious hepatic lesions are noted. Numerous small filling defects lie dependently in the gallbladder, compatible with gallstones and/or biliary sludge. Gallbladder is moderately distended. Gallbladder wall thickness is normal. No pericholecystic fluid. Common bile duct is mildly dilated measuring 8 mm. Tiny filling defects within the common bile duct, compatible with choledocholithiasis, largest of which measures 4 mm in the mid common bile duct (coronal T2 weighted image 17 of series 5). Pancreas: No pancreatic mass. No pancreatic ductal dilatation. No pancreatic or peripancreatic fluid collections or inflammatory changes. Spleen:  Unremarkable. Adrenals/Urinary Tract: Bilateral kidneys and bilateral adrenal glands are normal in appearance. No hydroureteronephrosis in the visualized portions of the abdomen. Stomach/Bowel: Visualized portions are unremarkable. Vascular/Lymphatic: Aortic atherosclerosis, without evidence of aneurysm in the abdominal vasculature. No lymphadenopathy noted in the abdomen. Other: No significant volume of ascites noted in the visualized portions of the peritoneal cavity. Musculoskeletal: No aggressive appearing osseous lesions are noted in the visualized portions of the skeleton. IMPRESSION: 1. Choledocholithiasis with mild common bile duct dilatation, indicating mild biliary tract obstruction. No intrahepatic biliary ductal dilatation. 2. Cholelithiasis without evidence of acute cholecystitis. 3. Hepatic steatosis. 4. Large hiatal hernia. Electronically Signed   By: Vinnie Langton M.D.   On: 12/20/2021 12:22   US Abdomen Limited RUQ (LIVER/GB)  Result Date: 12/19/2021 CLINICAL DATA:  76 year old male with history of abdominal pain and nausea. EXAM: ULTRASOUND ABDOMEN LIMITED RIGHT UPPER QUADRANT COMPARISON:  No priors. FINDINGS: Gallbladder: There is amorphous material and some small echogenic foci with posterior acoustic shadowing lying dependently in the  gallbladder, compatible with a combination of biliary sludge and tiny gallstones. Gallbladder is only moderately distended. Gallbladder wall thickness is  normal at 3.3 mm. No pericholecystic fluid. Per report from the sonographer, there was no sonographic Murphy's sign on examination. Common bile duct: Diameter: 7.3 mm Liver: Tiny hypoechoic lesion in the left lobe of the liver measuring 1.1 x 0.6 x 0.8 cm, indeterminate. Generalized increased echogenicity throughout the hepatic parenchyma, suggestive of hepatic steatosis. Portal vein is patent on color Doppler imaging with normal direction of blood flow towards the liver. Other: None. IMPRESSION: 1. Cholelithiasis and biliary sludge in the gallbladder, without evidence of acute cholecystitis at this time. 2. Mild prominence of the common bile duct measuring 7.3 mm in the porta hepatis. If there is clinical concern for choledocholithiasis or other cause of biliary obstruction, further evaluation with abdominal MRI with and without IV gadolinium with MRCP should be considered. 3. Hepatic steatosis. 4. Small indeterminate lesion in the left lobe of the liver. This too could be further evaluated with follow-up abdominal MRI with and without IV gadolinium with MRCP. Electronically Signed   By: Vinnie Langton M.D.   On: 12/19/2021 13:53      Assessment/Plan This is a 76 year old male who presented with abdominal pain and jaundice, and was found to have choledocholithiasis.  He is now postop day 1 status post ERCP with stone extraction.  LFTs are downtrending appropriately, and he has no signs of pancreatitis.  I recommended laparoscopic cholecystectomy to prevent recurrent choledocholithiasis and associated complications including cholangitis and pancreatitis.  I discussed the details of the surgery with the patient and he agrees to proceed. -NPO except medications for OR today. -No indication for antibiotics from a surgical standpoint his common bile duct has  been cleared and patient does not have cholecystitis. -Proceed to the OR today for laparoscopic cholecystectomy.   Michaelle Birks, MD [facility] Surgery General, Hepatobiliary and Pancreatic Surgery 12/21/21 9:14 AM

## 2021-12-21 NOTE — Anesthesia Procedure Notes (Signed)
Procedure Name: Intubation Date/Time: 12/21/2021 11:03 AM Performed by: Lavina Hamman, CRNA Pre-anesthesia Checklist: Patient identified, Emergency Drugs available, Suction available, Patient being monitored and Timeout performed Patient Re-evaluated:Patient Re-evaluated prior to induction Oxygen Delivery Method: Circle system utilized Preoxygenation: Pre-oxygenation with 100% oxygen Induction Type: IV induction Ventilation: Mask ventilation without difficulty Laryngoscope Size: Mac and 4 Grade View: Grade II Tube type: Oral Tube size: 7.5 mm Number of attempts: 1 Airway Equipment and Method: Stylet Placement Confirmation: ETT inserted through vocal cords under direct vision, positive ETCO2, CO2 detector and breath sounds checked- equal and bilateral Secured at: 22 cm Tube secured with: Tape Dental Injury: Teeth and Oropharynx as per pre-operative assessment  Comments: ATOI

## 2021-12-21 NOTE — Progress Notes (Signed)
Transition of Care (TOC) Screening Note  Patient Details  Name: Christian Dodson Date of Birth: [birth year]  Transition of Care [facility]) CM/SW Contact:    Sherie Don, LCSW Phone Number: 12/21/2021, 10:56 AM  Transition of Care Department [facility]) has reviewed patient and no TOC needs have been identified at this time. We will continue to monitor patient advancement through interdisciplinary progression rounds. If new patient transition needs arise, please place a TOC consult.

## 2021-12-21 NOTE — Progress Notes (Signed)
PROGRESS NOTE    Christian Dodson  MVE:[id number] DOB: 1946-05-31 DOA: 12/19/2021 PCP: Hoyt Koch, MD   Brief Narrative: Christian Dodson is a 76 y.o. male, known to Dr. Hilarie Fredrickson from prior colonoscopy which had been done in July 2022.  He had removal of 3 tubular adenomas and 2 hyperplastic polyps and was noted to have multiple diverticuli. Patient also with history of GERD, very remote history of ulcer disease, BPH and status post COVID-07 November 2021.  Patient has osteoarthritis and is awaiting upcoming hip replacement towards the end of January. Patient presented to the ER at to droppage yesterday with complaints of intermittent fevers over the prior few days and concerns for sinus infection.  He mentioned that he also had an episode of epigastric pain which had occurred on 12/17/2021 and lasted for several hours and resolved.  This was associated with nausea and vomiting x1, no radiation to the chest or back.  He had also noted some darker urine earlier in the week prior to the episode of epigastric pain.   Work-up in the ER with abnormal LFTs and elevated WBC.  WBC 13.8/hemoglobin 14.8/Mehta crit 42 Potassium 3.3 Creatinine 1.08 T bili 5.0/alk phos 205/AST 193/ALT 448, lipase 20 Lactate 1.5 Blood cultures pending respiratory panel negative   Upper abdominal ultrasound showed gallstones and sludge, normal gallbladder wall thickening, no pericholecystic fluid, CBD of 7.3 mm and there is a 1.1 x 0.6 cm indeterminant liver lesion as well as hepatic steatosis.   Patient transferred here for admission, he has been started on IV antibiotics/Unasyn Afebrile since admission and patient has no complaints of abdominal pain or nausea, he is hungry   Labs today WBC 9.4/hemoglobin 12.7/hematocrit 36 T bili 1.6/alk phos 172/AST 91/ALT 295 improved  Assessment & Plan:   Principal Problem:   Dilated cbd, acquired Active Problems:   HTN (hypertension)   GERD (gastroesophageal reflux  disease)   BPH (benign prostatic hyperplasia)   Pre-diabetes   Transaminitis   Choledocholithiasis   Elevated LFTs   Abnormal magnetic resonance cholangiopancreatography (MRCP)    #1 choledocholithiasis/cholangitis patient admitted with abdominal pain nausea and vomiting and fever at home.  Currently he is pain-free Status post ERCP with stone extraction.  12/20/2021 Status postcholecystectomy 12/21/2021  LFTs trending down.  Gallbladder ultrasound with stones and sludge.  CBD hepatic steatosis and indeterminate liver lesion. Total bilirubin down to 1.6 AST 91 ALT 295  #2 history of essential hypertension blood pressure 169/97 continue home meds.  Zestril 40 mg daily.  #3 BPH on Flomax  #4 GERD on PPI   Estimated body mass index is 26.79 kg/m as calculated from the following:   Height as of this encounter: 5' 6"  (1.676 m).   Weight as of this encounter: 75.3 kg.  DVT prophylaxis: Heparin Code Status: Full code Family Communication: None at bedside Disposition Plan:  Status is: Inpatient  Remains inpatient appropriate because: Admitted with cholangitis    Consultants: GI and general surgery  Procedures: None Antimicrobials: Unasyn  Subjective: He denies any abdominal pain he is anxious to have surgery  Objective: Vitals:   12/21/21 1245 12/21/21 1253 12/21/21 1300 12/21/21 1331  BP: (!) 149/88  (!) 155/88 (!) 169/97  Pulse: 85 86 85 85  Resp: 17 14 11 16   Temp:    97.6 F (36.4 C)  TempSrc:    Oral  SpO2: 94% 97% 96% 96%  Weight:      Height:        Intake/Output  Summary (Last 24 hours) at 12/21/2021 1415 Last data filed at 12/21/2021 1356 Gross per 24 hour  Intake 2753.19 ml  Output 1550 ml  Net 1203.19 ml    Filed Weights   12/20/21 1456  Weight: 75.3 kg    Examination:  General exam: Appears calm and comfortable  Respiratory system: Clear to auscultation. Respiratory effort normal. Cardiovascular system: S1 & S2 heard, RRR. No JVD, murmurs, rubs,  gallops or clicks. No pedal edema. Gastrointestinal system: Abdomen is nondistended, soft and nontender. No organomegaly or masses felt. Normal bowel sounds heard. Central nervous system: Alert and oriented. No focal neurological deficits. Extremities: Symmetric 5 x 5 power. Skin: No rashes, lesions or ulcers Psychiatry: Judgement and insight appear normal. Mood & affect appropriate.     Data Reviewed: I have personally reviewed following labs and imaging studies  CBC: Recent Labs  Lab 12/19/21 1308 12/20/21 0448 12/21/21 0432  WBC 13.8* 9.4 8.7  NEUTROABS 11.1*  --  7.0  HGB 14.8 12.7* 12.4*  HCT 42.3 36.2* 36.1*  MCV 91.2 93.1 93.8  PLT 218 182 229    Basic Metabolic Panel: Recent Labs  Lab 12/19/21 1308 12/20/21 0448 12/21/21 0432  NA 135 138 138  K 3.3* 3.9 4.4  CL 96* 107 107  CO2 27 24 23   GLUCOSE 154* 151* 152*  BUN 16 16 17   CREATININE 1.08 0.90 0.95  CALCIUM 10.3 9.1 9.1    GFR: Estimated Creatinine Clearance: 62.5 mL/min (by C-G formula based on SCr of 0.95 mg/dL). Liver Function Tests: Recent Labs  Lab 12/19/21 1308 12/20/21 0448 12/21/21 0432  AST 193* 91* 30  ALT 448* 295* 184*  ALKPHOS 205* 172* 143*  BILITOT 5.0* 1.6* 1.2  PROT 7.9 6.8 6.4*  ALBUMIN 4.6 3.8 3.3*    Recent Labs  Lab 12/19/21 1308  LIPASE 20    No results for input(s): AMMONIA in the last 168 hours. Coagulation Profile: No results for input(s): INR, PROTIME in the last 168 hours. Cardiac Enzymes: No results for input(s): CKTOTAL, CKMB, CKMBINDEX, TROPONINI in the last 168 hours. BNP (last 3 results) No results for input(s): PROBNP in the last 8760 hours. HbA1C: No results for input(s): HGBA1C in the last 72 hours. CBG: No results for input(s): GLUCAP in the last 168 hours. Lipid Profile: No results for input(s): CHOL, HDL, LDLCALC, TRIG, CHOLHDL, LDLDIRECT in the last 72 hours. Thyroid Function Tests: No results for input(s): TSH, T4TOTAL, FREET4, T3FREE,  THYROIDAB in the last 72 hours. Anemia Panel: No results for input(s): VITAMINB12, FOLATE, FERRITIN, TIBC, IRON, RETICCTPCT in the last 72 hours. Sepsis Labs: Recent Labs  Lab 12/19/21 1308  LATICACIDVEN 1.0   1.5     Recent Results (from the past 240 hour(s))  Urine Culture     Status: Abnormal   Collection Time: 12/19/21  1:08 PM   Specimen: Urine, Clean Catch  Result Value Ref Range Status   Specimen Description   Final    URINE, CLEAN CATCH Performed at [facility], [address] [place], [place] [address]    Special Requests   Final    NONE Performed at [facility], [address], [place], [place] [address]    Culture MULTIPLE SPECIES PRESENT, SUGGEST RECOLLECTION (A)  Final   Report Status 12/20/2021 FINAL  Final  Resp Panel by RT-PCR (Flu A&B, Covid) Urine, Clean Catch     Status: None   Collection Time: 12/19/21  1:08 PM   Specimen: Urine, Clean Catch;  Nasopharyngeal(NP) swabs in vial transport medium  Result Value Ref Range Status   SARS Coronavirus 2 by RT PCR NEGATIVE NEGATIVE Final    Comment: (NOTE) SARS-CoV-2 target nucleic acids are NOT DETECTED.  The SARS-CoV-2 RNA is generally detectable in upper respiratory specimens during the acute phase of infection. The lowest concentration of SARS-CoV-2 viral copies this assay can detect is 138 copies/mL. A negative result does not preclude SARS-Cov-2 infection and should not be used as the sole basis for treatment or other patient management decisions. A negative result may occur with  improper specimen collection/handling, submission of specimen other than nasopharyngeal swab, presence of viral mutation(s) within the areas targeted by this assay, and inadequate number of viral copies(<138 copies/mL). A negative result must be combined with clinical observations, patient history, and epidemiological information. The expected result is Negative.  Fact Sheet for  Patients:  EntrepreneurPulse.com.au  Fact Sheet for Healthcare Providers:  IncredibleEmployment.be  This test is no t yet approved or cleared by the [place] FDA and  has been authorized for detection and/or diagnosis of SARS-CoV-2 by FDA under an Emergency Use Authorization (EUA). This EUA will remain  in effect (meaning this test can be used) for the duration of the COVID-19 declaration under Section 564(b)(1) of the Act, 21 U.S.C.section 360bbb-3(b)(1), unless the authorization is terminated  or revoked sooner.       Influenza A by PCR NEGATIVE NEGATIVE Final   Influenza B by PCR NEGATIVE NEGATIVE Final    Comment: (NOTE) The Xpert Xpress SARS-CoV-2/FLU/RSV plus assay is intended as an aid in the diagnosis of influenza from Nasopharyngeal swab specimens and should not be used as a sole basis for treatment. Nasal washings and aspirates are unacceptable for Xpert Xpress SARS-CoV-2/FLU/RSV testing.  Fact Sheet for Patients: EntrepreneurPulse.com.au  Fact Sheet for Healthcare Providers: IncredibleEmployment.be  This test is not yet approved or cleared by the [place] FDA and has been authorized for detection and/or diagnosis of SARS-CoV-2 by FDA under an Emergency Use Authorization (EUA). This EUA will remain in effect (meaning this test can be used) for the duration of the COVID-19 declaration under Section 564(b)(1) of the Act, 21 U.S.C. section 360bbb-3(b)(1), unless the authorization is terminated or revoked.  Performed at [facility], [address] [place], [place] [address]   Blood culture (routine x 2)     Status: None (Preliminary result)   Collection Time: 12/19/21  3:39 PM   Specimen: BLOOD  Result Value Ref Range Status   Specimen Description   Final    BLOOD RIGHT ANTECUBITAL Performed at [facility], [address] [place],  [place] [address]    Special Requests   Final    BOTTLES DRAWN AEROBIC AND ANAEROBIC Blood Culture results may not be optimal due to an excessive volume of blood received in culture bottles Performed at [facility], [address] [place], [place] [address]    Culture   Final    NO GROWTH 2 DAYS Performed at [facility], [place] [address] [place], [place] [address]    Report Status PENDING  Incomplete  Blood culture (routine x 2)     Status: None (Preliminary result)   Collection Time: 12/19/21  4:00 PM   Specimen: BLOOD  Result Value Ref Range Status   Specimen Description   Final    BLOOD BLOOD RIGHT HAND Performed at [facility], [address] [place],  [address]    Special Requests  Final    BOTTLES DRAWN AEROBIC AND ANAEROBIC Blood Culture results may not be optimal due to an excessive volume of blood received in culture bottles Performed at [facility], [address] [place], [place] [address]    Culture   Final    NO GROWTH 2 DAYS Performed at [facility], [place] [address] [place], [place] [address]    Report Status PENDING  Incomplete  Surgical pcr screen     Status: None   Collection Time: 12/21/21 10:18 AM   Specimen: Nasal Mucosa; Nasal Swab  Result Value Ref Range Status   MRSA, PCR NEGATIVE NEGATIVE Final   Staphylococcus aureus NEGATIVE NEGATIVE Final    Comment: (NOTE) The Xpert SA Assay (FDA approved for NASAL specimens in patients 75 years of age and older), is one component of a comprehensive surveillance program. It is not intended to diagnose infection nor to guide or monitor treatment. Performed at [facility], [place] [address], [place], [place] [address]           Radiology Studies: MR 3D Recon At Scanner  Result Date: 12/20/2021 CLINICAL DATA:  76 year old male with history of dilated common bile duct on recent ultrasound examination. Evaluate for  choledocholithiasis. EXAM: MRI ABDOMEN WITH CONTRAST (WITH MRCP) TECHNIQUE: Multiplanar multisequence MR imaging of the abdomen was performed following the administration of intravenous contrast. Heavily T2-weighted images of the biliary and pancreatic ducts were obtained, and three-dimensional MRCP images were rendered by post processing. CONTRAST:  7.98m GADAVIST GADOBUTROL 1 MMOL/ML IV SOLN COMPARISON:  No prior abdominal MRI. Abdominal ultrasound 12/19/2021. FINDINGS: Lower chest: Large hiatal hernia. Hepatobiliary: Mild diffuse loss of signal intensity throughout the hepatic parenchyma on out of phase dual echo images, indicative of hepatic steatosis. 9 mm T1 hypointense, T2 hyperintense, nonenhancing lesion in segment 7 of the liver, compatible with a simple cyst. No other suspicious hepatic lesions are noted. Numerous small filling defects lie dependently in the gallbladder, compatible with gallstones and/or biliary sludge. Gallbladder is moderately distended. Gallbladder wall thickness is normal. No pericholecystic fluid. Common bile duct is mildly dilated measuring 8 mm. Tiny filling defects within the common bile duct, compatible with choledocholithiasis, largest of which measures 4 mm in the mid common bile duct (coronal T2 weighted image 17 of series 5). Pancreas: No pancreatic mass. No pancreatic ductal dilatation. No pancreatic or peripancreatic fluid collections or inflammatory changes. Spleen:  Unremarkable. Adrenals/Urinary Tract: Bilateral kidneys and bilateral adrenal glands are normal in appearance. No hydroureteronephrosis in the visualized portions of the abdomen. Stomach/Bowel: Visualized portions are unremarkable. Vascular/Lymphatic: Aortic atherosclerosis, without evidence of aneurysm in the abdominal vasculature. No lymphadenopathy noted in the abdomen. Other: No significant volume of ascites noted in the visualized portions of the peritoneal cavity. Musculoskeletal: No aggressive appearing  osseous lesions are noted in the visualized portions of the skeleton. IMPRESSION: 1. Choledocholithiasis with mild common bile duct dilatation, indicating mild biliary tract obstruction. No intrahepatic biliary ductal dilatation. 2. Cholelithiasis without evidence of acute cholecystitis. 3. Hepatic steatosis. 4. Large hiatal hernia. Electronically Signed   By: DVinnie LangtonM.D.   On: 12/20/2021 12:22   CT Maxillofacial W Contrast  Result Date: 12/19/2021 CLINICAL DATA:  Headaches. Intermittent fevers. Concern for recurrent sinus infection. EXAM: CT MAXILLOFACIAL WITH CONTRAST TECHNIQUE: Multidetector CT imaging of the maxillofacial structures was performed with intravenous contrast. Multiplanar CT image reconstructions were also generated. CONTRAST:  737mOMNIPAQUE IOHEXOL 300 MG/ML  SOLN COMPARISON:  None. FINDINGS: Osseous: No focal  osseous lesions or fracture. No significant dental disease. Orbits: The globes and orbits are within normal limits. Sinuses: The paranasal sinuses and mastoid air cells are clear. Soft tissues: Soft tissues the face are unremarkable. Atherosclerotic changes are present at the carotid bifurcations, right greater than left without definite stenosis. Limited intracranial: Unremarkable. IMPRESSION: Negative CT of the face.  No significant sinus disease. Electronically Signed   By: San Morelle M.D.   On: 12/19/2021 15:15   DG ERCP  Result Date: 12/21/2021 CLINICAL DATA:  Choledocholithiasis. EXAM: ERCP TECHNIQUE: Multiple spot images obtained with the fluoroscopic device and submitted for interpretation post-procedure. FLUOROSCOPY TIME:  Fluoroscopy Time:  1 minute, 40 seconds Radiation Exposure Index (if provided by the fluoroscopic device): 48.86 mGy Number of Acquired Spot Images: 9 COMPARISON:  MRI 12/20/2021 FINDINGS: Retrograde cholangiogram demonstrates opacification of the common bile duct, proximal intrahepatic bile ducts and the gallbladder. Evidence for  filling defects in the gallbladder are compatible with known gallstones. Mild biliary dilatation. Question a small filling defect in the common bile duct. IMPRESSION: ERCP for biliary stone extraction. Cholelithiasis. These images were submitted for radiologic interpretation only. Please see the procedural report for the amount of contrast and the fluoroscopy time utilized. Electronically Signed   By: Markus Daft M.D.   On: 12/21/2021 09:35   MR ABDOMEN WITH MRCP W CONTRAST  Result Date: 12/20/2021 CLINICAL DATA:  76 year old male with history of dilated common bile duct on recent ultrasound examination. Evaluate for choledocholithiasis. EXAM: MRI ABDOMEN WITH CONTRAST (WITH MRCP) TECHNIQUE: Multiplanar multisequence MR imaging of the abdomen was performed following the administration of intravenous contrast. Heavily T2-weighted images of the biliary and pancreatic ducts were obtained, and three-dimensional MRCP images were rendered by post processing. CONTRAST:  7.51m GADAVIST GADOBUTROL 1 MMOL/ML IV SOLN COMPARISON:  No prior abdominal MRI. Abdominal ultrasound 12/19/2021. FINDINGS: Lower chest: Large hiatal hernia. Hepatobiliary: Mild diffuse loss of signal intensity throughout the hepatic parenchyma on out of phase dual echo images, indicative of hepatic steatosis. 9 mm T1 hypointense, T2 hyperintense, nonenhancing lesion in segment 7 of the liver, compatible with a simple cyst. No other suspicious hepatic lesions are noted. Numerous small filling defects lie dependently in the gallbladder, compatible with gallstones and/or biliary sludge. Gallbladder is moderately distended. Gallbladder wall thickness is normal. No pericholecystic fluid. Common bile duct is mildly dilated measuring 8 mm. Tiny filling defects within the common bile duct, compatible with choledocholithiasis, largest of which measures 4 mm in the mid common bile duct (coronal T2 weighted image 17 of series 5). Pancreas: No pancreatic mass. No  pancreatic ductal dilatation. No pancreatic or peripancreatic fluid collections or inflammatory changes. Spleen:  Unremarkable. Adrenals/Urinary Tract: Bilateral kidneys and bilateral adrenal glands are normal in appearance. No hydroureteronephrosis in the visualized portions of the abdomen. Stomach/Bowel: Visualized portions are unremarkable. Vascular/Lymphatic: Aortic atherosclerosis, without evidence of aneurysm in the abdominal vasculature. No lymphadenopathy noted in the abdomen. Other: No significant volume of ascites noted in the visualized portions of the peritoneal cavity. Musculoskeletal: No aggressive appearing osseous lesions are noted in the visualized portions of the skeleton. IMPRESSION: 1. Choledocholithiasis with mild common bile duct dilatation, indicating mild biliary tract obstruction. No intrahepatic biliary ductal dilatation. 2. Cholelithiasis without evidence of acute cholecystitis. 3. Hepatic steatosis. 4. Large hiatal hernia. Electronically Signed   By: DVinnie LangtonM.D.   On: 12/20/2021 12:22        Scheduled Meds:  finasteride  5 mg Oral Daily   heparin  5,000 Units  Subcutaneous Q8H   hydrochlorothiazide  25 mg Oral Daily   lisinopril  40 mg Oral Daily   pantoprazole  40 mg Oral Daily   Continuous Infusions:     LOS: 2 days    Time spent: 39 min    Georgette Shell, MD  12/21/2021, 2:15 PM

## 2021-12-21 NOTE — Anesthesia Postprocedure Evaluation (Signed)
Anesthesia Post Note  Patient: Christian Dodson  Procedure(s) Performed: [facility]     Patient location during evaluation: PACU Anesthesia Type: General Level of consciousness: awake and alert Pain management: pain level controlled Vital Signs Assessment: post-procedure vital signs reviewed and stable Respiratory status: spontaneous breathing, nonlabored ventilation and respiratory function stable Cardiovascular status: blood pressure returned to baseline and stable Postop Assessment: no apparent nausea or vomiting Anesthetic complications: no   No notable events documented.  Last Vitals:  Vitals:   12/21/21 1225 12/21/21 1230  BP:  (!) 150/82  Pulse: 80 77  Resp: 15 13  Temp:    SpO2: 97% 96%    Last Pain:  Vitals:   12/21/21 1230  TempSrc:   PainSc: 3                  [name],W. EDMOND

## 2021-12-22 ENCOUNTER — Encounter (HOSPITAL_COMMUNITY): Payer: Self-pay | Admitting: Surgery

## 2021-12-22 DIAGNOSIS — K838 Other specified diseases of biliary tract: Secondary | ICD-10-CM | POA: Diagnosis not present

## 2021-12-22 LAB — COMPREHENSIVE METABOLIC PANEL
ALT: 171 U/L — ABNORMAL HIGH (ref 0–44)
AST: 51 U/L — ABNORMAL HIGH (ref 15–41)
Albumin: 3.3 g/dL — ABNORMAL LOW (ref 3.5–5.0)
Alkaline Phosphatase: 113 U/L (ref 38–126)
Anion gap: 12 (ref 5–15)
BUN: 14 mg/dL (ref 8–23)
CO2: 23 mmol/L (ref 22–32)
Calcium: 9.1 mg/dL (ref 8.9–10.3)
Chloride: 101 mmol/L (ref 98–111)
Creatinine, Ser: 0.88 mg/dL (ref 0.61–1.24)
GFR, Estimated: >60 mL/min (ref >60)
Glucose, Bld: 151 mg/dL — ABNORMAL HIGH (ref 70–99)
Potassium: 3.8 mmol/L (ref 3.5–5.1)
Sodium: 136 mmol/L (ref 135–145)
Total Bilirubin: 1.3 mg/dL — ABNORMAL HIGH (ref 0.3–1.2)
Total Protein: 6.4 g/dL — ABNORMAL LOW (ref 6.5–8.1)

## 2021-12-22 LAB — CBC
HCT: 36 % — ABNORMAL LOW (ref 39.0–52.0)
Hemoglobin: 12.5 g/dL — ABNORMAL LOW (ref 13.0–17.0)
MCH: 32.3 pg (ref 26.0–34.0)
MCHC: 34.7 g/dL (ref 30.0–36.0)
MCV: 93 fL (ref 80.0–100.0)
Platelets: 226 10*3/uL (ref 150–400)
RBC: 3.87 MIL/uL — ABNORMAL LOW (ref 4.22–5.81)
RDW: 13.2 % (ref 11.5–15.5)
WBC: 15.4 10*3/uL — ABNORMAL HIGH (ref 4.0–10.5)
nRBC: 0 % (ref 0.0–0.2)

## 2021-12-22 MED ORDER — ONDANSETRON HCL 4 MG PO TABS: 4.0000 mg | ORAL_TABLET | Freq: Four times a day (QID) | ORAL | 0 refills | Status: DC | PRN

## 2021-12-22 NOTE — Discharge Instructions (Signed)
[facility]  Please arrive at least 30 min before your appointment to complete your check in paperwork.  If you are unable to arrive 30 min prior to your appointment time we may have to cancel or reschedule you. LAPAROSCOPIC SURGERY: POST OP INSTRUCTIONS Always review your discharge instruction sheet given to you by the facility where your surgery was performed. IF YOU HAVE DISABILITY OR FAMILY LEAVE FORMS, YOU MUST BRING THEM TO THE OFFICE FOR PROCESSING.   DO NOT GIVE THEM TO YOUR DOCTOR.  PAIN CONTROL  First take acetaminophen (Tylenol) AND/or ibuprofen (Advil) to control your pain after surgery.  Follow directions on package.  Taking acetaminophen (Tylenol) and/or ibuprofen (Advil) regularly after surgery will help to control your pain and lower the amount of prescription pain medication you may need.  You should not take more than 4,000 mg (4 grams) of acetaminophen (Tylenol) in 24 hours.  You should not take ibuprofen (Advil), aleve, motrin, naprosyn or other NSAIDS if you have a history of stomach ulcers or chronic kidney disease.  A prescription for pain medication may be given to you upon discharge.  Take your pain medication as prescribed, if you still have uncontrolled pain after taking acetaminophen (Tylenol) or ibuprofen (Advil). Use ice packs to help control pain. If you need a refill on your pain medication, please contact your pharmacy.  They will contact our office to request authorization. Prescriptions will not be filled after 5pm or on week-ends.  HOME MEDICATIONS Take your usually prescribed medications unless otherwise directed.  DIET You should follow a light diet the first few days after arrival home.  Be sure to include lots of fluids daily. Avoid fatty, fried foods.   CONSTIPATION It is common to experience some constipation after surgery and if you are taking pain medication.  Increasing fluid intake and taking a stool softener (such as Colace)  will usually help or prevent this problem from occurring.  A mild laxative (Milk of Magnesia or Miralax) should be taken according to package instructions if there are no bowel movements after 48 hours.  WOUND/INCISION CARE Most patients will experience some swelling and bruising in the area of the incisions.  Ice packs will help.  Swelling and bruising can take several days to resolve.  Unless discharge instructions indicate otherwise, follow guidelines below  STERI-STRIPS - you may remove your outer bandages 48 hours after surgery, and you may shower at that time.  You have steri-strips (small skin tapes) in place directly over the incision.  These strips should be left on the skin for 7-10 days.   DERMABOND/SKIN GLUE - you may shower in 24 hours.  The glue will flake off over the next 2-3 weeks. Any sutures or staples will be removed at the office during your follow-up visit.  ACTIVITIES You may resume regular (light) daily activities beginning the next day--such as daily self-care, walking, climbing stairs--gradually increasing activities as tolerated.  You may have sexual intercourse when it is comfortable.  Refrain from any heavy lifting or straining until approved by your doctor. You may drive when you are no longer taking prescription pain medication, you can comfortably wear a seatbelt, and you can safely maneuver your car and apply brakes.  FOLLOW-UP You should see your doctor in the office for a follow-up appointment approximately 2-3 weeks after your surgery.  You should have been given your post-op/follow-up appointment when your surgery was scheduled.  If you did not receive a post-op/follow-up appointment, make sure   that you call for this appointment within a day or two after you arrive home to insure a convenient appointment time.  OTHER INSTRUCTIONS  WHEN TO CALL YOUR DOCTOR: Fever over 101.0 Inability to urinate Continued bleeding from incision. Increased pain, redness, or  drainage from the incision. Increasing abdominal pain  The clinic staff is available to answer your questions during regular business hours.  Please don't hesitate to call and ask to speak to one of the nurses for clinical concerns.  If you have a medical emergency, go to the nearest emergency room or call 911.  A surgeon from [facility] is always on call at the hospital. [address], [address], [place], [place]  [address] ? [address] [address], [place], [place]   [address] [phone] ? [phone] ? FAX [phone]   

## 2021-12-22 NOTE — Discharge Summary (Signed)
Physician Discharge Summary  Christian Dodson XNA:[id number] DOB: [birth year] DOA: 12/19/2021  PCP: Hoyt Koch, MD  Admit date: 12/19/2021 Discharge date: 12/22/2021  Admitted From: home Disposition:  home Recommendations for Outpatient Follow-up:  Follow up with PCP in 1-2 weeks Please obtain CMP/CBC in one week Please follow up with Dr. Cheral Bay  Home Health:NONE Equipment/Devices:NONE Discharge Condition:stable CODE STATUS:full record Diet recommendation: cardiac  Brief/Interim Summary:Christian Dodson is a 76 y.o. male, known to Dr. Hilarie Fredrickson from prior colonoscopy which had been done in July 2022.  He had removal of 3 tubular adenomas and 2 hyperplastic polyps and was noted to have multiple diverticuli. Patient also with history of GERD, very remote history of ulcer disease, BPH and status post COVID-07 November 2021.  Patient has osteoarthritis and is awaiting upcoming hip replacement towards the end of January. Patient presented to the ER at to droppage yesterday with complaints of intermittent fevers over the prior few days and concerns for sinus infection.  He mentioned that he also had an episode of epigastric pain which had occurred on 12/17/2021 and lasted for several hours and resolved.  This was associated with nausea and vomiting x1, no radiation to the chest or back.  He had also noted some darker urine earlier in the week prior to the episode of epigastric pain.   Work-up in the ER with abnormal LFTs and elevated WBC.  WBC 13.8/hemoglobin 14.8/Mehta crit 42 Potassium 3.3 Creatinine 1.08 T bili 5.0/alk phos 205/AST 193/ALT 448, lipase 20 Lactate 1.5 Blood cultures pending respiratory panel negative   Upper abdominal ultrasound showed gallstones and sludge, normal gallbladder wall thickening, no pericholecystic fluid, CBD of 7.3 mm and there is a 1.1 x 0.6 cm indeterminant liver lesion as well as hepatic steatosis.   Patient was  transferred  to [facility] for admission, he  has been started on IV antibiotics/Unasyn Afebrile since admission and patient has no complaints of abdominal pain or nausea, he is hungry   Labs  WBC 9.4/hemoglobin 12.7/hematocrit 36 T bili 1.6/alk phos 172/AST 91/ALT 295 improved  Discharge Diagnoses:  Principal Problem:   Dilated cbd, acquired Active Problems:   HTN (hypertension)   GERD (gastroesophageal reflux disease)   BPH (benign prostatic hyperplasia)   Pre-diabetes   Transaminitis   Choledocholithiasis   Elevated LFTs   Abnormal magnetic resonance cholangiopancreatography (MRCP)     #1 choledocholithiasis/cholangitis patient admitted with abdominal pain nausea and vomiting and fever at home.  Currently he is pain-free Status post ERCP with stone extraction.  12/20/2021 Status postcholecystectomy 12/21/2021  Gallbladder ultrasound with stones and sludge.  CBD hepatic steatosis and indeterminate liver lesion. Mild elevation in transaminases noted on the day of discharge which was expected after cholecystectomy. he was seen by Dr. Zenia Resides general surgery.follow-up with general surgery.   #2 history of essential hypertension blood pressure 169/97 continue home meds.  Zestril 40 mg daily.   #3 BPH on Flomax  #4 Gerd continue PPI   Estimated body mass index is 26.79 kg/m as calculated from the following:   Height as of this encounter: 5' 6"  (1.676 m).   Weight as of this encounter: 75.3 kg.  Discharge Instructions  Discharge Instructions     Diet - low sodium heart healthy   Complete by: As directed    Increase activity slowly   Complete by: As directed    No wound care   Complete by: As directed       Allergies as of 12/22/2021   No  Known Allergies      Medication List     STOP taking these medications    amoxicillin 500 MG capsule Commonly known as: AMOXIL   omeprazole 20 MG tablet Commonly known as: PRILOSEC OTC       TAKE these medications    acetaminophen 650 MG CR tablet Commonly known as:  TYLENOL Take 650 mg by mouth See admin instructions. Take 2 tablets (1300 mg) by mouth daily at bedtime, may also take 2 tablets (1300 mg) two more times daily as needed for pain Notes to patient: Last dose given 01/03 05:57am   cetirizine 10 MG tablet Commonly known as: ZYRTEC Take 10 mg by mouth every morning. Notes to patient: Resume home regimen   finasteride 5 MG tablet Commonly known as: PROSCAR TAKE 1 TABLET DAILY What changed:  how to take this when to take this   GLUCOSAMINE-CHONDROITIN PO Take 1 tablet by mouth every morning. Notes to patient: Resume home regimen   hydrochlorothiazide 25 MG tablet Commonly known as: HYDRODIURIL Take 1 tablet (25 mg total) by mouth daily. Annual appt due in May must see provider for future refills What changed: when to take this   lisinopril 40 MG tablet Commonly known as: ZESTRIL TAKE 1 TABLET DAILY What changed: when to take this   omeprazole 20 MG capsule Commonly known as: PRILOSEC Take 20 mg by mouth daily.   ondansetron 4 MG tablet Commonly known as: ZOFRAN Take 1 tablet (4 mg total) by mouth every 6 (six) hours as needed for nausea. Notes to patient: Last dose given 01/02 10:44am   simvastatin 20 MG tablet Commonly known as: ZOCOR Take 1 tablet (20 mg total) by mouth at bedtime.   VITAMIN C PO Take 1 tablet by mouth every morning. Notes to patient: Resume home regimen   VITAMIN D PO Take 1 tablet by mouth every morning. Notes to patient: Resume home regimen   ZINC PO Take 1 tablet by mouth every morning. Notes to patient: Resume home regimen        Follow-up [facility], [place]. Call.   Specialty: General Surgery Why: We are working on your appointment, call to confirm Please arrive 30 minutes prior to your appointment to check in and fill out paperwork. Bring photo ID and insurance information. Contact information: [address] [place] [phone]        Hoyt Koch, MD Follow up.   Specialty: Internal Medicine Contact information: [place] [address] [phone]                No Known Allergies  Consultations: Gi surgery   Procedures/Studies: MR 3D Recon At Scanner  Result Date: 12/20/2021 CLINICAL DATA:  76 year old male with history of dilated common bile duct on recent ultrasound examination. Evaluate for choledocholithiasis. EXAM: MRI ABDOMEN WITH CONTRAST (WITH MRCP) TECHNIQUE: Multiplanar multisequence MR imaging of the abdomen was performed following the administration of intravenous contrast. Heavily T2-weighted images of the biliary and pancreatic ducts were obtained, and three-dimensional MRCP images were rendered by post processing. CONTRAST:  7.83m GADAVIST GADOBUTROL 1 MMOL/ML IV SOLN COMPARISON:  No prior abdominal MRI. Abdominal ultrasound 12/19/2021. FINDINGS: Lower chest: Large hiatal hernia. Hepatobiliary: Mild diffuse loss of signal intensity throughout the hepatic parenchyma on out of phase dual echo images, indicative of hepatic steatosis. 9 mm T1 hypointense, T2 hyperintense, nonenhancing lesion in segment 7 of the liver, compatible with  a simple cyst. No other suspicious hepatic lesions are noted. Numerous small filling defects lie dependently in the gallbladder, compatible with gallstones and/or biliary sludge. Gallbladder is moderately distended. Gallbladder wall thickness is normal. No pericholecystic fluid. Common bile duct is mildly dilated measuring 8 mm. Tiny filling defects within the common bile duct, compatible with choledocholithiasis, largest of which measures 4 mm in the mid common bile duct (coronal T2 weighted image 17 of series 5). Pancreas: No pancreatic mass. No pancreatic ductal dilatation. No pancreatic or peripancreatic fluid collections or inflammatory changes. Spleen:  Unremarkable. Adrenals/Urinary Tract: Bilateral kidneys and  bilateral adrenal glands are normal in appearance. No hydroureteronephrosis in the visualized portions of the abdomen. Stomach/Bowel: Visualized portions are unremarkable. Vascular/Lymphatic: Aortic atherosclerosis, without evidence of aneurysm in the abdominal vasculature. No lymphadenopathy noted in the abdomen. Other: No significant volume of ascites noted in the visualized portions of the peritoneal cavity. Musculoskeletal: No aggressive appearing osseous lesions are noted in the visualized portions of the skeleton. IMPRESSION: 1. Choledocholithiasis with mild common bile duct dilatation, indicating mild biliary tract obstruction. No intrahepatic biliary ductal dilatation. 2. Cholelithiasis without evidence of acute cholecystitis. 3. Hepatic steatosis. 4. Large hiatal hernia. Electronically Signed   By: Vinnie Langton M.D.   On: 12/20/2021 12:22   CT Maxillofacial W Contrast  Result Date: 12/19/2021 CLINICAL DATA:  Headaches. Intermittent fevers. Concern for recurrent sinus infection. EXAM: CT MAXILLOFACIAL WITH CONTRAST TECHNIQUE: Multidetector CT imaging of the maxillofacial structures was performed with intravenous contrast. Multiplanar CT image reconstructions were also generated. CONTRAST:  39m OMNIPAQUE IOHEXOL 300 MG/ML  SOLN COMPARISON:  None. FINDINGS: Osseous: No focal osseous lesions or fracture. No significant dental disease. Orbits: The globes and orbits are within normal limits. Sinuses: The paranasal sinuses and mastoid air cells are clear. Soft tissues: Soft tissues the face are unremarkable. Atherosclerotic changes are present at the carotid bifurcations, right greater than left without definite stenosis. Limited intracranial: Unremarkable. IMPRESSION: Negative CT of the face.  No significant sinus disease. Electronically Signed   By: CSan MorelleM.D.   On: 12/19/2021 15:15   DG Chest Portable 1 View  Result Date: 12/19/2021 CLINICAL DATA:  76year old male with history of  fevers and cough. EXAM: PORTABLE CHEST 1 VIEW COMPARISON:  No priors. FINDINGS: Lung volumes are normal. Diffuse peribronchial cuffing. No consolidative airspace disease. No pleural effusions. No pneumothorax. No pulmonary nodule or mass noted. Pulmonary vasculature and the cardiomediastinal silhouette are within normal limits. IMPRESSION: 1. Diffuse peribronchial cuffing concerning for an acute bronchitis. Electronically Signed   By: DVinnie LangtonM.D.   On: 12/19/2021 14:18   DG ERCP  Result Date: 12/21/2021 CLINICAL DATA:  Choledocholithiasis. EXAM: ERCP TECHNIQUE: Multiple spot images obtained with the fluoroscopic device and submitted for interpretation post-procedure. FLUOROSCOPY TIME:  Fluoroscopy Time:  1 minute, 40 seconds Radiation Exposure Index (if provided by the fluoroscopic device): 48.86 mGy Number of Acquired Spot Images: 9 COMPARISON:  MRI 12/20/2021 FINDINGS: Retrograde cholangiogram demonstrates opacification of the common bile duct, proximal intrahepatic bile ducts and the gallbladder. Evidence for filling defects in the gallbladder are compatible with known gallstones. Mild biliary dilatation. Question a small filling defect in the common bile duct. IMPRESSION: ERCP for biliary stone extraction. Cholelithiasis. These images were submitted for radiologic interpretation only. Please see the procedural report for the amount of contrast and the fluoroscopy time utilized. Electronically Signed   By: AMarkus DaftM.D.   On: 12/21/2021 09:35   MR ABDOMEN WITH MRCP W CONTRAST  Result Date: 12/20/2021 CLINICAL DATA:  76 year old male with history of dilated common bile duct on recent ultrasound examination. Evaluate for choledocholithiasis. EXAM: MRI ABDOMEN WITH CONTRAST (WITH MRCP) TECHNIQUE: Multiplanar multisequence MR imaging of the abdomen was performed following the administration of intravenous contrast. Heavily T2-weighted images of the biliary and pancreatic ducts were obtained, and  three-dimensional MRCP images were rendered by post processing. CONTRAST:  7.85m GADAVIST GADOBUTROL 1 MMOL/ML IV SOLN COMPARISON:  No prior abdominal MRI. Abdominal ultrasound 12/19/2021. FINDINGS: Lower chest: Large hiatal hernia. Hepatobiliary: Mild diffuse loss of signal intensity throughout the hepatic parenchyma on out of phase dual echo images, indicative of hepatic steatosis. 9 mm T1 hypointense, T2 hyperintense, nonenhancing lesion in segment 7 of the liver, compatible with a simple cyst. No other suspicious hepatic lesions are noted. Numerous small filling defects lie dependently in the gallbladder, compatible with gallstones and/or biliary sludge. Gallbladder is moderately distended. Gallbladder wall thickness is normal. No pericholecystic fluid. Common bile duct is mildly dilated measuring 8 mm. Tiny filling defects within the common bile duct, compatible with choledocholithiasis, largest of which measures 4 mm in the mid common bile duct (coronal T2 weighted image 17 of series 5). Pancreas: No pancreatic mass. No pancreatic ductal dilatation. No pancreatic or peripancreatic fluid collections or inflammatory changes. Spleen:  Unremarkable. Adrenals/Urinary Tract: Bilateral kidneys and bilateral adrenal glands are normal in appearance. No hydroureteronephrosis in the visualized portions of the abdomen. Stomach/Bowel: Visualized portions are unremarkable. Vascular/Lymphatic: Aortic atherosclerosis, without evidence of aneurysm in the abdominal vasculature. No lymphadenopathy noted in the abdomen. Other: No significant volume of ascites noted in the visualized portions of the peritoneal cavity. Musculoskeletal: No aggressive appearing osseous lesions are noted in the visualized portions of the skeleton. IMPRESSION: 1. Choledocholithiasis with mild common bile duct dilatation, indicating mild biliary tract obstruction. No intrahepatic biliary ductal dilatation. 2. Cholelithiasis without evidence of acute  cholecystitis. 3. Hepatic steatosis. 4. Large hiatal hernia. Electronically Signed   By: DVinnie LangtonM.D.   On: 12/20/2021 12:22   UKoreaAbdomen Limited RUQ (LIVER/GB)  Result Date: 12/19/2021 CLINICAL DATA:  76year old male with history of abdominal pain and nausea. EXAM: ULTRASOUND ABDOMEN LIMITED RIGHT UPPER QUADRANT COMPARISON:  No priors. FINDINGS: Gallbladder: There is amorphous material and some small echogenic foci with posterior acoustic shadowing lying dependently in the gallbladder, compatible with a combination of biliary sludge and tiny gallstones. Gallbladder is only moderately distended. Gallbladder wall thickness is normal at 3.3 mm. No pericholecystic fluid. Per report from the sonographer, there was no sonographic Murphy's sign on examination. Common bile duct: Diameter: 7.3 mm Liver: Tiny hypoechoic lesion in the left lobe of the liver measuring 1.1 x 0.6 x 0.8 cm, indeterminate. Generalized increased echogenicity throughout the hepatic parenchyma, suggestive of hepatic steatosis. Portal vein is patent on color Doppler imaging with normal direction of blood flow towards the liver. Other: None. IMPRESSION: 1. Cholelithiasis and biliary sludge in the gallbladder, without evidence of acute cholecystitis at this time. 2. Mild prominence of the common bile duct measuring 7.3 mm in the porta hepatis. If there is clinical concern for choledocholithiasis or other cause of biliary obstruction, further evaluation with abdominal MRI with and without IV gadolinium with MRCP should be considered. 3. Hepatic steatosis. 4. Small indeterminate lesion in the left lobe of the liver. This too could be further evaluated with follow-up abdominal MRI with and without IV gadolinium with MRCP. Electronically Signed   By: DVinnie LangtonM.D.   On: 12/19/2021 13:53   (  Echo, Carotid, EGD, Colonoscopy, ERCP)    Subjective:  patient is resting in bed is anxious to go home Tolerating a regular diet no nausea  vomiting Discharge Exam: Vitals:   12/22/21 0545 12/22/21 0831  BP: 133/79 139/83  Pulse: 79 70  Resp: 18 17  Temp: 98.3 F (36.8 C) 98.2 F (36.8 C)  SpO2: 95% 96%   Vitals:   12/21/21 1331 12/21/21 2044 12/22/21 0545 12/22/21 0831  BP: (!) 169/97 (!) 141/80 133/79 139/83  Pulse: 85 86 79 70  Resp: 16 14 18 17   Temp: 97.6 F (36.4 C) 99.2 F (37.3 C) 98.3 F (36.8 C) 98.2 F (36.8 C)  TempSrc: Oral Oral Oral Oral  SpO2: 96% 96% 95% 96%  Weight:      Height:        General: Pt is alert, awake, not in acute distress Cardiovascular: RRR, S1/S2 +, no rubs, no gallops Respiratory: CTA bilaterally, no wheezing, no rhonchi Abdominal: Soft, incisional tenderness ND, bowel sounds + Extremities: no edema, no cyanosis    The results of significant diagnostics from this hospitalization (including imaging, microbiology, ancillary and laboratory) are listed below for reference.     Microbiology: Recent Results (from the past 240 hour(s))  Urine Culture     Status: Abnormal   Collection Time: 12/19/21  1:08 PM   Specimen: Urine, Clean Catch  Result Value Ref Range Status   Specimen Description   Final    URINE, CLEAN CATCH Performed at [facility], [address] [place], [place] [address]    Special Requests   Final    NONE Performed at [facility], [address] [place], [place] [address]    Culture MULTIPLE SPECIES PRESENT, SUGGEST RECOLLECTION (A)  Final   Report Status 12/20/2021 FINAL  Final  Resp Panel by RT-PCR (Flu A&B, Covid) Urine, Clean Catch     Status: None   Collection Time: 12/19/21  1:08 PM   Specimen: Urine, Clean Catch; Nasopharyngeal(NP) swabs in vial transport medium  Result Value Ref Range Status   SARS Coronavirus 2 by RT PCR NEGATIVE NEGATIVE Final    Comment: (NOTE) SARS-CoV-2 target nucleic acids are NOT DETECTED.  The SARS-CoV-2 RNA is generally detectable in upper respiratory specimens during  the acute phase of infection. The lowest concentration of SARS-CoV-2 viral copies this assay can detect is 138 copies/mL. A negative result does not preclude SARS-Cov-2 infection and should not be used as the sole basis for treatment or other patient management decisions. A negative result may occur with  improper specimen collection/handling, submission of specimen other than nasopharyngeal swab, presence of viral mutation(s) within the areas targeted by this assay, and inadequate number of viral copies(<138 copies/mL). A negative result must be combined with clinical observations, patient history, and epidemiological information. The expected result is Negative.  Fact Sheet for Patients:  EntrepreneurPulse.com.au  Fact Sheet for Healthcare Providers:  IncredibleEmployment.be  This test is no t yet approved or cleared by the [place] FDA and  has been authorized for detection and/or diagnosis of SARS-CoV-2 by FDA under an Emergency Use Authorization (EUA). This EUA will remain  in effect (meaning this test can be used) for the duration of the COVID-19 declaration under Section 564(b)(1) of the Act, 21 U.S.C.section 360bbb-3(b)(1), unless the authorization is terminated  or revoked sooner.       Influenza A by PCR NEGATIVE NEGATIVE Final   Influenza B by PCR NEGATIVE NEGATIVE Final    Comment: (NOTE)  The Xpert Xpress SARS-CoV-2/FLU/RSV plus assay is intended as an aid in the diagnosis of influenza from Nasopharyngeal swab specimens and should not be used as a sole basis for treatment. Nasal washings and aspirates are unacceptable for Xpert Xpress SARS-CoV-2/FLU/RSV testing.  Fact Sheet for Patients: EntrepreneurPulse.com.au  Fact Sheet for Healthcare Providers: IncredibleEmployment.be  This test is not yet approved or cleared by the [place] FDA and has been authorized for detection and/or  diagnosis of SARS-CoV-2 by FDA under an Emergency Use Authorization (EUA). This EUA will remain in effect (meaning this test can be used) for the duration of the COVID-19 declaration under Section 564(b)(1) of the Act, 21 U.S.C. section 360bbb-3(b)(1), unless the authorization is terminated or revoked.  Performed at [facility], [address], [place], [place] [address]   Blood culture (routine x 2)     Status: None (Preliminary result)   Collection Time: 12/19/21  3:39 PM   Specimen: BLOOD  Result Value Ref Range Status   Specimen Description   Final    BLOOD RIGHT ANTECUBITAL Performed at [facility], [address], [place], [place] [address]    Special Requests   Final    BOTTLES DRAWN AEROBIC AND ANAEROBIC Blood Culture results may not be optimal due to an excessive volume of blood received in culture bottles Performed at [facility], [address] [place], [place] [address]    Culture   Final    NO GROWTH 3 DAYS Performed at [facility], [place] [address], [place], [place] [address]    Report Status PENDING  Incomplete  Blood culture (routine x 2)     Status: None (Preliminary result)   Collection Time: 12/19/21  4:00 PM   Specimen: BLOOD  Result Value Ref Range Status   Specimen Description   Final    BLOOD BLOOD RIGHT HAND Performed at [facility], [address], [place], [place] [address]    Special Requests   Final    BOTTLES DRAWN AEROBIC AND ANAEROBIC Blood Culture results may not be optimal due to an excessive volume of blood received in culture bottles Performed at [facility], [address] [place], [place] [address]    Culture   Final    NO GROWTH 3 DAYS Performed at [facility], [place] [address] [place], [place] [address]    Report Status PENDING  Incomplete  Surgical pcr screen     Status: None   Collection Time: 12/21/21 10:18 AM    Specimen: Nasal Mucosa; Nasal Swab  Result Value Ref Range Status   MRSA, PCR NEGATIVE NEGATIVE Final   Staphylococcus aureus NEGATIVE NEGATIVE Final    Comment: (NOTE) The Xpert SA Assay (FDA approved for NASAL specimens in patients 79 years of age and older), is one component of a comprehensive surveillance program. It is not intended to diagnose infection nor to guide or monitor treatment. Performed at [facility], [place] [address], [place], [place] [address]      Labs: BNP (last 3 results) No results for input(s): BNP in the last 8760 hours. Basic Metabolic Panel: Recent Labs  Lab 12/19/21 1308 12/20/21 0448 12/21/21 0432 12/22/21 0306  NA 135 138 138 136  K 3.3* 3.9 4.4 3.8  CL 96* 107 107 101  CO2 27 24 23 23   GLUCOSE 154* 151* 152* 151*  BUN 16 16 17 14   CREATININE 1.08 0.90 0.95 0.88  CALCIUM 10.3 9.1 9.1 9.1  Liver Function Tests: Recent Labs  Lab 12/19/21 1308 12/20/21 0448 12/21/21 0432 12/22/21 0306  AST 193* 91* 30 51*  ALT 448* 295* 184* 171*  ALKPHOS 205* 172* 143* 113  BILITOT 5.0* 1.6* 1.2 1.3*  PROT 7.9 6.8 6.4* 6.4*  ALBUMIN 4.6 3.8 3.3* 3.3*   Recent Labs  Lab 12/19/21 1308  LIPASE 20   No results for input(s): AMMONIA in the last 168 hours. CBC: Recent Labs  Lab 12/19/21 1308 12/20/21 0448 12/21/21 0432 12/22/21 0306  WBC 13.8* 9.4 8.7 15.4*  NEUTROABS 11.1*  --  7.0  --   HGB 14.8 12.7* 12.4* 12.5*  HCT 42.3 36.2* 36.1* 36.0*  MCV 91.2 93.1 93.8 93.0  PLT 218 182 206 226   Cardiac Enzymes: No results for input(s): CKTOTAL, CKMB, CKMBINDEX, TROPONINI in the last 168 hours. BNP: Invalid input(s): POCBNP CBG: No results for input(s): GLUCAP in the last 168 hours. D-Dimer No results for input(s): DDIMER in the last 72 hours. Hgb A1c No results for input(s): HGBA1C in the last 72 hours. Lipid Profile No results for input(s): CHOL, HDL, LDLCALC, TRIG, CHOLHDL, LDLDIRECT in the last 72 hours. Thyroid  function studies No results for input(s): TSH, T4TOTAL, T3FREE, THYROIDAB in the last 72 hours.  Invalid input(s): FREET3 Anemia work up No results for input(s): VITAMINB12, FOLATE, FERRITIN, TIBC, IRON, RETICCTPCT in the last 72 hours. Urinalysis    Component Value Date/Time   COLORURINE YELLOW 12/19/2021 [place] 12/19/2021 1308   LABSPEC 1.009 12/19/2021 1308   PHURINE 5.5 12/19/2021 1308   GLUCOSEU 100 (A) 12/19/2021 1308   GLUCOSEU NEGATIVE 03/07/2014 1654   HGBUR NEGATIVE 12/19/2021 1308   BILIRUBINUR SMALL (A) 12/19/2021 1308   KETONESUR NEGATIVE 12/19/2021 1308   PROTEINUR NEGATIVE 12/19/2021 1308   UROBILINOGEN 0.2 03/07/2014 1654   NITRITE NEGATIVE 12/19/2021 1308   LEUKOCYTESUR NEGATIVE 12/19/2021 1308   Sepsis Labs Invalid input(s): PROCALCITONIN,  WBC,  LACTICIDVEN Microbiology Recent Results (from the past 240 hour(s))  Urine Culture     Status: Abnormal   Collection Time: 12/19/21  1:08 PM   Specimen: Urine, Clean Catch  Result Value Ref Range Status   Specimen Description   Final    URINE, CLEAN CATCH Performed at [facility], [address] [place], [place] [address]    Special Requests   Final    NONE Performed at [facility], [address], [place], [place] [address]    Culture MULTIPLE SPECIES PRESENT, SUGGEST RECOLLECTION (A)  Final   Report Status 12/20/2021 FINAL  Final  Resp Panel by RT-PCR (Flu A&B, Covid) Urine, Clean Catch     Status: None   Collection Time: 12/19/21  1:08 PM   Specimen: Urine, Clean Catch; Nasopharyngeal(NP) swabs in vial transport medium  Result Value Ref Range Status   SARS Coronavirus 2 by RT PCR NEGATIVE NEGATIVE Final    Comment: (NOTE) SARS-CoV-2 target nucleic acids are NOT DETECTED.  The SARS-CoV-2 RNA is generally detectable in upper respiratory specimens during the acute phase of infection. The lowest concentration of SARS-CoV-2 viral copies this assay  can detect is 138 copies/mL. A negative result does not preclude SARS-Cov-2 infection and should not be used as the sole basis for treatment or other patient management decisions. A negative result may occur with  improper specimen collection/handling, submission of specimen other than nasopharyngeal swab, presence of viral mutation(s) within the areas targeted by this assay, and inadequate number of viral copies(<138 copies/mL). A  negative result must be combined with clinical observations, patient history, and epidemiological information. The expected result is Negative.  Fact Sheet for Patients:  EntrepreneurPulse.com.au  Fact Sheet for Healthcare Providers:  IncredibleEmployment.be  This test is no t yet approved or cleared by the [place] FDA and  has been authorized for detection and/or diagnosis of SARS-CoV-2 by FDA under an Emergency Use Authorization (EUA). This EUA will remain  in effect (meaning this test can be used) for the duration of the COVID-19 declaration under Section 564(b)(1) of the Act, 21 U.S.C.section 360bbb-3(b)(1), unless the authorization is terminated  or revoked sooner.       Influenza A by PCR NEGATIVE NEGATIVE Final   Influenza B by PCR NEGATIVE NEGATIVE Final    Comment: (NOTE) The Xpert Xpress SARS-CoV-2/FLU/RSV plus assay is intended as an aid in the diagnosis of influenza from Nasopharyngeal swab specimens and should not be used as a sole basis for treatment. Nasal washings and aspirates are unacceptable for Xpert Xpress SARS-CoV-2/FLU/RSV testing.  Fact Sheet for Patients: EntrepreneurPulse.com.au  Fact Sheet for Healthcare Providers: IncredibleEmployment.be  This test is not yet approved or cleared by the [place] FDA and has been authorized for detection and/or diagnosis of SARS-CoV-2 by FDA under an Emergency Use Authorization (EUA). This EUA will  remain in effect (meaning this test can be used) for the duration of the COVID-19 declaration under Section 564(b)(1) of the Act, 21 U.S.C. section 360bbb-3(b)(1), unless the authorization is terminated or revoked.  Performed at [facility], [address] [place], [place] [address]   Blood culture (routine x 2)     Status: None (Preliminary result)   Collection Time: 12/19/21  3:39 PM   Specimen: BLOOD  Result Value Ref Range Status   Specimen Description   Final    BLOOD RIGHT ANTECUBITAL Performed at [facility], [address], [place], [place] [address]    Special Requests   Final    BOTTLES DRAWN AEROBIC AND ANAEROBIC Blood Culture results may not be optimal due to an excessive volume of blood received in culture bottles Performed at [facility], [address], [place], [place] [address]    Culture   Final    NO GROWTH 3 DAYS Performed at [facility], [place] [address] [place], [place] [address]    Report Status PENDING  Incomplete  Blood culture (routine x 2)     Status: None (Preliminary result)   Collection Time: 12/19/21  4:00 PM   Specimen: BLOOD  Result Value Ref Range Status   Specimen Description   Final    BLOOD BLOOD RIGHT HAND Performed at [facility], [address] [place], [place] [address]    Special Requests   Final    BOTTLES DRAWN AEROBIC AND ANAEROBIC Blood Culture results may not be optimal due to an excessive volume of blood received in culture bottles Performed at [facility], [address], [place], [place] [address]    Culture   Final    NO GROWTH 3 DAYS Performed at [facility], [place] [address] [place], [place] [address]    Report Status PENDING  Incomplete  Surgical pcr screen     Status: None   Collection Time: 12/21/21 10:18 AM   Specimen: Nasal Mucosa; Nasal Swab  Result Value Ref Range Status   MRSA, PCR NEGATIVE  NEGATIVE Final   Staphylococcus aureus NEGATIVE NEGATIVE Final    Comment: (NOTE) The Xpert SA  Assay (FDA approved for NASAL specimens in patients 2 years of age and older), is one component of a comprehensive surveillance program. It is not intended to diagnose infection nor to guide or monitor treatment. Performed at [facility], [place] [address] [place], [place] [address]      Time coordinating discharge:  39 minutes  SIGNED: Georgette Shell, MD  [facility] 12/22/2021, 5:01 PM

## 2021-12-22 NOTE — Plan of Care (Signed)

## 2021-12-22 NOTE — Progress Notes (Signed)
1 Day Post-Op  Subjective: Having some incisional pain but overall feeling well. Tolerating PO intake. Afebrile, vitals stable. Tbili 1.3.   Objective: Vital signs in last 24 hours: Temp:  [97.6 F (36.4 C)-99.2 F (37.3 C)] 98.2 F (36.8 C) (01/03 0831) Pulse Rate:  [70-86] 70 (01/03 0831) Resp:  [11-18] 17 (01/03 0831) BP: (133-169)/(79-97) 139/83 (01/03 0831) SpO2:  [94 %-100 %] 96 % (01/03 0831) Last BM Date: 12/20/21  Intake/Output from previous day: 01/02 0701 - 01/03 0700 In: 1080 [P.O.:180; I.V.:800; IV Piggyback:100] Out: 50 [Blood:50] Intake/Output this shift: Total I/O In: 240 [P.O.:240] Out: -   PE: General: resting comfortably, NAD Neuro: alert and oriented, no focal deficits Resp: normal work of breathing Abdomen: soft, nondistended, nontender to palpation. Incisions clean and dry with no erythema or induration. Extremities: warm and well-perfused   Lab Results:  Recent Labs    12/21/21 0432 12/22/21 0306  WBC 8.7 15.4*  HGB 12.4* 12.5*  HCT 36.1* 36.0*  PLT 206 226   BMET Recent Labs    12/21/21 0432 12/22/21 0306  NA 138 136  K 4.4 3.8  CL 107 101  CO2 23 23  GLUCOSE 152* 151*  BUN 17 14  CREATININE 0.95 0.88  CALCIUM 9.1 9.1   PT/INR No results for input(s): LABPROT, INR in the last 72 hours. CMP     Component Value Date/Time   NA 136 12/22/2021 0306   NA 137 01/28/2010 0000   K 3.8 12/22/2021 0306   CL 101 12/22/2021 0306   CO2 23 12/22/2021 0306   GLUCOSE 151 (H) 12/22/2021 0306   BUN 14 12/22/2021 0306   BUN 21 01/28/2010 0000   CREATININE 0.88 12/22/2021 0306   CALCIUM 9.1 12/22/2021 0306   PROT 6.4 (L) 12/22/2021 0306   ALBUMIN 3.3 (L) 12/22/2021 0306   AST 51 (H) 12/22/2021 0306   ALT 171 (H) 12/22/2021 0306   ALKPHOS 113 12/22/2021 0306   BILITOT 1.3 (H) 12/22/2021 0306   GFRNONAA >60 12/22/2021 0306   GFRAA >60 09/18/2020 0946   Lipase     Component Value Date/Time   LIPASE 20 12/19/2021 1308        Studies/Results: MR 3D Recon At Scanner  Result Date: 12/20/2021 CLINICAL DATA:  76 year old male with history of dilated common bile duct on recent ultrasound examination. Evaluate for choledocholithiasis. EXAM: MRI ABDOMEN WITH CONTRAST (WITH MRCP) TECHNIQUE: Multiplanar multisequence MR imaging of the abdomen was performed following the administration of intravenous contrast. Heavily T2-weighted images of the biliary and pancreatic ducts were obtained, and three-dimensional MRCP images were rendered by post processing. CONTRAST:  7.79m GADAVIST GADOBUTROL 1 MMOL/ML IV SOLN COMPARISON:  No prior abdominal MRI. Abdominal ultrasound 12/19/2021. FINDINGS: Lower chest: Large hiatal hernia. Hepatobiliary: Mild diffuse loss of signal intensity throughout the hepatic parenchyma on out of phase dual echo images, indicative of hepatic steatosis. 9 mm T1 hypointense, T2 hyperintense, nonenhancing lesion in segment 7 of the liver, compatible with a simple cyst. No other suspicious hepatic lesions are noted. Numerous small filling defects lie dependently in the gallbladder, compatible with gallstones and/or biliary sludge. Gallbladder is moderately distended. Gallbladder wall thickness is normal. No pericholecystic fluid. Common bile duct is mildly dilated measuring 8 mm. Tiny filling defects within the common bile duct, compatible with choledocholithiasis, largest of which measures 4 mm in the mid common bile duct (coronal T2 weighted image 17 of series 5). Pancreas: No pancreatic mass. No pancreatic ductal dilatation. No pancreatic or  peripancreatic fluid collections or inflammatory changes. Spleen:  Unremarkable. Adrenals/Urinary Tract: Bilateral kidneys and bilateral adrenal glands are normal in appearance. No hydroureteronephrosis in the visualized portions of the abdomen. Stomach/Bowel: Visualized portions are unremarkable. Vascular/Lymphatic: Aortic atherosclerosis, without evidence of aneurysm in the  abdominal vasculature. No lymphadenopathy noted in the abdomen. Other: No significant volume of ascites noted in the visualized portions of the peritoneal cavity. Musculoskeletal: No aggressive appearing osseous lesions are noted in the visualized portions of the skeleton. IMPRESSION: 1. Choledocholithiasis with mild common bile duct dilatation, indicating mild biliary tract obstruction. No intrahepatic biliary ductal dilatation. 2. Cholelithiasis without evidence of acute cholecystitis. 3. Hepatic steatosis. 4. Large hiatal hernia. Electronically Signed   By: Vinnie Langton M.D.   On: 12/20/2021 12:22   DG ERCP  Result Date: 12/21/2021 CLINICAL DATA:  Choledocholithiasis. EXAM: ERCP TECHNIQUE: Multiple spot images obtained with the fluoroscopic device and submitted for interpretation post-procedure. FLUOROSCOPY TIME:  Fluoroscopy Time:  1 minute, 40 seconds Radiation Exposure Index (if provided by the fluoroscopic device): 48.86 mGy Number of Acquired Spot Images: 9 COMPARISON:  MRI 12/20/2021 FINDINGS: Retrograde cholangiogram demonstrates opacification of the common bile duct, proximal intrahepatic bile ducts and the gallbladder. Evidence for filling defects in the gallbladder are compatible with known gallstones. Mild biliary dilatation. Question a small filling defect in the common bile duct. IMPRESSION: ERCP for biliary stone extraction. Cholelithiasis. These images were submitted for radiologic interpretation only. Please see the procedural report for the amount of contrast and the fluoroscopy time utilized. Electronically Signed   By: Markus Daft M.D.   On: 12/21/2021 09:35   MR ABDOMEN WITH MRCP W CONTRAST  Result Date: 12/20/2021 CLINICAL DATA:  76 year old male with history of dilated common bile duct on recent ultrasound examination. Evaluate for choledocholithiasis. EXAM: MRI ABDOMEN WITH CONTRAST (WITH MRCP) TECHNIQUE: Multiplanar multisequence MR imaging of the abdomen was performed following  the administration of intravenous contrast. Heavily T2-weighted images of the biliary and pancreatic ducts were obtained, and three-dimensional MRCP images were rendered by post processing. CONTRAST:  7.59m GADAVIST GADOBUTROL 1 MMOL/ML IV SOLN COMPARISON:  No prior abdominal MRI. Abdominal ultrasound 12/19/2021. FINDINGS: Lower chest: Large hiatal hernia. Hepatobiliary: Mild diffuse loss of signal intensity throughout the hepatic parenchyma on out of phase dual echo images, indicative of hepatic steatosis. 9 mm T1 hypointense, T2 hyperintense, nonenhancing lesion in segment 7 of the liver, compatible with a simple cyst. No other suspicious hepatic lesions are noted. Numerous small filling defects lie dependently in the gallbladder, compatible with gallstones and/or biliary sludge. Gallbladder is moderately distended. Gallbladder wall thickness is normal. No pericholecystic fluid. Common bile duct is mildly dilated measuring 8 mm. Tiny filling defects within the common bile duct, compatible with choledocholithiasis, largest of which measures 4 mm in the mid common bile duct (coronal T2 weighted image 17 of series 5). Pancreas: No pancreatic mass. No pancreatic ductal dilatation. No pancreatic or peripancreatic fluid collections or inflammatory changes. Spleen:  Unremarkable. Adrenals/Urinary Tract: Bilateral kidneys and bilateral adrenal glands are normal in appearance. No hydroureteronephrosis in the visualized portions of the abdomen. Stomach/Bowel: Visualized portions are unremarkable. Vascular/Lymphatic: Aortic atherosclerosis, without evidence of aneurysm in the abdominal vasculature. No lymphadenopathy noted in the abdomen. Other: No significant volume of ascites noted in the visualized portions of the peritoneal cavity. Musculoskeletal: No aggressive appearing osseous lesions are noted in the visualized portions of the skeleton. IMPRESSION: 1. Choledocholithiasis with mild common bile duct dilatation,  indicating mild biliary tract obstruction. No intrahepatic biliary  ductal dilatation. 2. Cholelithiasis without evidence of acute cholecystitis. 3. Hepatic steatosis. 4. Large hiatal hernia. Electronically Signed   By: Vinnie Langton M.D.   On: 12/20/2021 12:22    Anti-infectives: Anti-infectives (From admission, onward)    Start     Dose/Rate Route Frequency Ordered Stop   12/20/21 0045  Ampicillin-Sulbactam (UNASYN) 3 g in sodium chloride 0.9 % 100 mL IVPB  Status:  Discontinued        3 g 200 mL/hr over 30 Minutes Intravenous Every 8 hours 12/19/21 2349 12/21/21 1203   12/19/21 1545  cefTRIAXone (ROCEPHIN) 2 g in sodium chloride 0.9 % 100 mL IVPB        2 g 200 mL/hr over 30 Minutes Intravenous  Once 12/19/21 1538 12/19/21 1644   12/19/21 1545  metroNIDAZOLE (FLAGYL) IVPB 500 mg        500 mg 100 mL/hr over 60 Minutes Intravenous  Once 12/19/21 1538 12/19/21 1713        Assessment/Plan 76 yo male with choledocholithiasis, s/p ERCP and POD1 s/p laparoscopic cholecystectomy. - Regular diet as tolerated - PO pain medications - Mild elevation in transaminases is expected after cholecystectomy. Very mild Tbili elevation likely from peritoneal absorption of bile spilled intraop. Alk phos is normal so ongoing biliary obstruction is unlikely. - Ok for discharge home. Will arrange outpatient follow up in 3-4 weeks.    LOS: 3 days    Michaelle Birks, MD [facility] General, Hepatobiliary and Pancreatic Surgery 12/22/21 10:38 AM

## 2021-12-22 NOTE — Plan of Care (Signed)

## 2021-12-22 NOTE — Progress Notes (Signed)
Provided discharge education/instructions, all questions and concerns addressed, Pt not in acute distress, discharged home with belongings accompanied by wife.

## 2021-12-23 ENCOUNTER — Telehealth: Payer: Self-pay

## 2021-12-23 LAB — SURGICAL PATHOLOGY

## 2021-12-23 NOTE — Telephone Encounter (Signed)
Transition Care Management Follow-up Telephone Call Date of discharge and from where: [place] 12-22-21 DX: dilated CBD How have you been since you were released from the hospital? Better but sore Any questions or concerns? No  Items Reviewed: Did the pt receive and understand the discharge instructions provided? Yes  Medications obtained and verified? Yes  Other? No  Any new allergies since your discharge? Yes  Dietary orders reviewed? Yes Do you have support at home? Yes   Home Care and Equipment/Supplies: Were home health services ordered? no If so, what is the name of the agency? na  Has the agency set up a time to come to the patient's home? not applicable Were any new equipment or medical supplies ordered?  No What is the name of the medical supply agency? na Were you able to get the supplies/equipment? no Do you have any questions related to the use of the equipment or supplies? No  Functional Questionnaire: (I = Independent and D = Dependent) ADLs: I  Bathing/Dressing- I  Meal Prep- I  Eating- I  Maintaining continence- I  Transferring/Ambulation- I  Managing Meds- I  Follow up appointments reviewed:  PCP Hospital f/u appt confirmed? Yes  Scheduled to see Dr Sharlet Salina on 12-28-21 @ 220pm. [facility] f/u appt confirmed? Yes  Scheduled to see Dr Zenia Resides on 01-12-22 @ 9am. Are transportation arrangements needed? No  If their condition worsens, is the pt aware to call PCP or go to the Emergency Dept.? Yes Was the patient provided with contact information for the PCP's office or ED? Yes Was to pt encouraged to call back with questions or concerns? Yes

## 2021-12-24 LAB — CULTURE, BLOOD (ROUTINE X 2)
Culture: NO GROWTH
Culture: NO GROWTH

## 2021-12-28 ENCOUNTER — Ambulatory Visit: Payer: Medicare Other | Admitting: Internal Medicine

## 2021-12-28 ENCOUNTER — Encounter: Payer: Self-pay | Admitting: Internal Medicine

## 2021-12-28 ENCOUNTER — Other Ambulatory Visit: Payer: Self-pay

## 2021-12-28 DIAGNOSIS — I10 Essential (primary) hypertension: Secondary | ICD-10-CM | POA: Diagnosis not present

## 2021-12-28 DIAGNOSIS — K8050 Calculus of bile duct without cholangitis or cholecystitis without obstruction: Secondary | ICD-10-CM | POA: Diagnosis not present

## 2021-12-28 DIAGNOSIS — R7989 Other specified abnormal findings of blood chemistry: Secondary | ICD-10-CM

## 2021-12-28 NOTE — Patient Instructions (Signed)
We will follow up the pre-op labs and if they do not recheck the liver numbers we will check them in a month or so.

## 2021-12-28 NOTE — Progress Notes (Signed)
° °  Subjective:   Patient ID: Christian Dodson, male    DOB: 09-Aug-1946, 76 y.o.   MRN: [id number]  HPI The patient is a 76 YO man coming in for hospital follow up. In for gallstones and gallbladder removal. Doing well overall and also scheduled for hip replacement end of January. No bowel issues. Still on lifting restriction.   Review of Systems  Constitutional: Negative.   HENT: Negative.    Eyes: Negative.   Respiratory:  Negative for cough, chest tightness and shortness of breath.   Cardiovascular:  Negative for chest pain, palpitations and leg swelling.  Gastrointestinal:  Negative for abdominal distention, abdominal pain, constipation, diarrhea, nausea and vomiting.  Musculoskeletal: Negative.   Skin: Negative.   Neurological: Negative.   Psychiatric/Behavioral: Negative.     Objective:  Physical Exam Constitutional:      Appearance: He is well-developed.  HENT:     Head: Normocephalic and atraumatic.  Cardiovascular:     Rate and Rhythm: Normal rate and regular rhythm.  Pulmonary:     Effort: Pulmonary effort is normal. No respiratory distress.     Breath sounds: Normal breath sounds. No wheezing or rales.  Abdominal:     General: Bowel sounds are normal. There is no distension.     Palpations: Abdomen is soft.     Tenderness: There is no abdominal tenderness. There is no rebound.  Musculoskeletal:        General: Tenderness present.     Cervical back: Normal range of motion.  Skin:    General: Skin is warm and dry.  Neurological:     Mental Status: He is alert and oriented to person, place, and time.     Coordination: Coordination normal.    Vitals:   12/28/21 1425  BP: 124/76  Pulse: 82  Resp: 18  SpO2: 98%  Weight: 165 lb 12.8 oz (75.2 kg)  Height: 5\' 6"  (1.676 m)    This visit occurred during the SARS-CoV-2 public health emergency.  Safety protocols were in place, including screening questions prior to the visit, additional usage of staff PPE, and  extensive cleaning of exam room while observing appropriate contact time as indicated for disinfecting solutions.   Assessment & Plan:

## 2022-01-01 NOTE — Assessment & Plan Note (Signed)
Overall improving during hospital stay. We will monitor pre-op labs from later this month. If LFTs checked we will follow. If not we will plan for mid-February labs to recheck.

## 2022-01-01 NOTE — Assessment & Plan Note (Signed)
Doing well s/p cholecystectomy. No bowel issues and on lifting restrictions. Follow up with surgeon arranged. Okay to proceed with hip replacement later this month without issues.

## 2022-01-01 NOTE — Assessment & Plan Note (Signed)
BP at goal on hctz 25 mg daily and lisinopril 40 mg daily. Recent BMP at goal no indication for repeat today.

## 2022-01-06 NOTE — Patient Instructions (Addendum)
DUE TO COVID-19 ONLY ONE VISITOR IS ALLOWED TO COME WITH YOU AND STAY IN THE WAITING ROOM ONLY DURING PRE OP AND PROCEDURE.   **NO VISITORS ARE ALLOWED IN THE SHORT STAY AREA OR RECOVERY ROOM!!**  IF YOU WILL BE ADMITTED INTO THE HOSPITAL YOU ARE ALLOWED ONLY TWO SUPPORT PEOPLE DURING VISITATION HOURS ONLY (7 AM -8PM)   The support person(s) must pass our screening, gel in and out, and wear a mask at all times, including in the patients room. Patients must also wear a mask when staff or their support person are in the room. Visitors GUEST BADGE MUST BE WORN VISIBLY  One adult visitor may remain with you overnight and MUST be in the room by 8 P.M.  No visitors under the age of 43. Any visitor under the age of 41 must be accompanied by an adult.        Your procedure is scheduled on: 01/19/22   Report to [facility] Main Entrance    Report to admitting at 6:00 AM   Call this number if you have problems the morning of surgery [phone]   Do not eat food :After Midnight.   May have liquids until 5:40 AM day of surgery  CLEAR LIQUID DIET  Foods Allowed                                                                     Foods Excluded  Water, Black Coffee and tea (no milk or creamer)           liquids that you cannot  Plain Jell-O in any flavor  (No red)                                    see through such as: Fruit ices (not with fruit pulp)                                            milk, soups, orange juice              Iced Popsicles (No red)                                               All solid food                                   Apple juices Sports drinks like Gatorade (No red) Lightly seasoned clear broth or consume(fat free) Sugar      The day of surgery:  Drink ONE (1) Pre-Surgery G2 at 5:40 AM the morning of surgery. Drink in one sitting. Do not sip.  This drink was given to you during your hospital  pre-op appointment visit. Nothing else to drink after  completing the  Pre-Surgery G2.          If you have questions, please contact your  surgeons office.     Oral Hygiene is also important to reduce your risk of infection.                                    Remember - BRUSH YOUR TEETH THE MORNING OF SURGERY WITH YOUR REGULAR TOOTHPASTE   Stop all vitamins and supplements 7 days before surgery   Take these medicines the morning of surgery with A SIP OF WATER: Tylenol, Zyrtec, Omeprazole                               You may not have any metal on your body including jewelry, and body piercing             Do not wear lotions, powders, cologne, or deodorant              Men may shave face and neck.   Do not bring valuables to the hospital. [place].   Bring small overnight bag day of surgery.   Special Instructions: Bring a copy of your healthcare power of attorney and living will documents         the day of surgery if you haven't scanned them before.              Please read over the following fact sheets you were given: IF YOU HAVE QUESTIONS ABOUT YOUR PRE-OP INSTRUCTIONS PLEASE CALL [facility] - Preparing for Surgery Before surgery, you can play an important role.  Because skin is not sterile, your skin needs to be as free of germs as possible.  You can reduce the number of germs on your skin by washing with CHG (chlorahexidine gluconate) soap before surgery.  CHG is an antiseptic cleaner which kills germs and bonds with the skin to continue killing germs even after washing. Please DO NOT use if you have an allergy to CHG or antibacterial soaps.  If your skin becomes reddened/irritated stop using the CHG and inform your nurse when you arrive at Short Stay. Do not shave (including legs and underarms) for at least 48 hours prior to the first CHG shower.  You may shave your face/neck.  Please follow these instructions carefully:  1.  Shower with CHG Soap the  night before surgery and the  morning of surgery.  2.  If you choose to wash your hair, wash your hair first as usual with your normal  shampoo.  3.  After you shampoo, rinse your hair and body thoroughly to remove the shampoo.                             4.  Use CHG as you would any other liquid soap.  You can apply chg directly to the skin and wash.  Gently with a scrungie or clean washcloth.  5.  Apply the CHG Soap to your body ONLY FROM THE NECK DOWN.   Do   not use on face/ open                           Wound or open sores. Avoid contact with eyes, ears mouth and  genitals (private parts).                       Wash face,  Genitals (private parts) with your normal soap.             6.  Wash thoroughly, paying special attention to the area where your    surgery  will be performed.  7.  Thoroughly rinse your body with warm water from the neck down.  8.  DO NOT shower/wash with your normal soap after using and rinsing off the CHG Soap.                9.  Pat yourself dry with a clean towel.            10.  Wear clean pajamas.            11.  Place clean sheets on your bed the night of your first shower and do not  sleep with pets. Day of Surgery : Do not apply any lotions/deodorants the morning of surgery.  Please wear clean clothes to the hospital/surgery center.  FAILURE TO FOLLOW THESE INSTRUCTIONS MAY RESULT IN THE CANCELLATION OF YOUR SURGERY  PATIENT SIGNATURE_________________________________  NURSE SIGNATURE__________________________________  ________________________________________________________________________   Christian Dodson  An incentive spirometer is a tool that can help keep your lungs clear and active. This tool measures how well you are filling your lungs with each breath. Taking long deep breaths may help reverse or decrease the chance of developing breathing (pulmonary) problems (especially infection) following: A long period of time when you are unable to  move or be active. BEFORE THE PROCEDURE  If the spirometer includes an indicator to show your best effort, your nurse or respiratory therapist will set it to a desired goal. If possible, sit up straight or lean slightly forward. Try not to slouch. Hold the incentive spirometer in an upright position. INSTRUCTIONS FOR USE  Sit on the edge of your bed if possible, or sit up as far as you can in bed or on a chair. Hold the incentive spirometer in an upright position. Breathe out normally. Place the mouthpiece in your mouth and seal your lips tightly around it. Breathe in slowly and as deeply as possible, raising the piston or the ball toward the top of the column. Hold your breath for 3-5 seconds or for as long as possible. Allow the piston or ball to fall to the bottom of the column. Remove the mouthpiece from your mouth and breathe out normally. Rest for a few seconds and repeat Steps 1 through 7 at least 10 times every 1-2 hours when you are awake. Take your time and take a few normal breaths between deep breaths. The spirometer may include an indicator to show your best effort. Use the indicator as a goal to work toward during each repetition. After each set of 10 deep breaths, practice coughing to be sure your lungs are clear. If you have an incision (the cut made at the time of surgery), support your incision when coughing by placing a pillow or rolled up towels firmly against it. Once you are able to get out of bed, walk around indoors and cough well. You may stop using the incentive spirometer when instructed by your caregiver.  RISKS AND COMPLICATIONS Take your time so you do not get dizzy or light-headed. If you are in pain, you may need to take or ask for pain medication before doing incentive spirometry. It  is harder to take a deep breath if you are having pain. AFTER USE Rest and breathe slowly and easily. It can be helpful to keep track of a log of your progress. Your caregiver can  provide you with a simple table to help with this. If you are using the spirometer at home, follow these instructions: [place] IF:  You are having difficultly using the spirometer. You have trouble using the spirometer as often as instructed. Your pain medication is not giving enough relief while using the spirometer. You develop fever of 100.5 F (38.1 C) or higher. SEEK IMMEDIATE MEDICAL CARE IF:  You cough up bloody sputum that had not been present before. You develop fever of 102 F (38.9 C) or greater. You develop worsening pain at or near the incision site. MAKE SURE YOU:  Understand these instructions. Will watch your condition. Will get help right away if you are not doing well or get worse. Document Released: 04/18/2007 Document Revised: 02/28/2012 Document Reviewed: 06/19/2007 Exit[employer] Patient Information 2014 [employer], [place].   ________________________________________________________________________  WHAT IS A BLOOD TRANSFUSION? Blood Transfusion Information  A transfusion is the replacement of blood or some of its parts. Blood is made up of multiple cells which provide different functions. Red blood cells carry oxygen and are used for blood loss replacement. White blood cells fight against infection. Platelets control bleeding. Plasma helps clot blood. Other blood products are available for specialized needs, such as hemophilia or other clotting disorders. BEFORE THE TRANSFUSION  Who gives blood for transfusions?  Healthy volunteers who are fully evaluated to make sure their blood is safe. This is blood bank blood. Transfusion therapy is the safest it has ever been in the practice of medicine. Before blood is taken from a donor, a complete history is taken to make sure that person has no history of diseases nor engages in risky social behavior (examples are intravenous drug use or sexual activity with multiple partners). The donor's travel history is screened to  minimize risk of transmitting infections, such as malaria. The donated blood is tested for signs of infectious diseases, such as HIV and hepatitis. The blood is then tested to be sure it is compatible with you in order to minimize the chance of a transfusion reaction. If you or a relative donates blood, this is often done in anticipation of surgery and is not appropriate for emergency situations. It takes many days to process the donated blood. RISKS AND COMPLICATIONS Although transfusion therapy is very safe and saves many lives, the main dangers of transfusion include:  Getting an infectious disease. Developing a transfusion reaction. This is an allergic reaction to something in the blood you were given. Every precaution is taken to prevent this. The decision to have a blood transfusion has been considered carefully by your caregiver before blood is given. Blood is not given unless the benefits outweigh the risks. AFTER THE TRANSFUSION Right after receiving a blood transfusion, you will usually feel much better and more energetic. This is especially true if your red blood cells have gotten low (anemic). The transfusion raises the level of the red blood cells which carry oxygen, and this usually causes an energy increase. The nurse administering the transfusion will monitor you carefully for complications. HOME CARE INSTRUCTIONS  No special instructions are needed after a transfusion. You may find your energy is better. Speak with your caregiver about any limitations on activity for underlying diseases you may have. SEEK MEDICAL CARE IF:  Your condition  is not improving after your transfusion. You develop redness or irritation at the intravenous (IV) site. SEEK IMMEDIATE MEDICAL CARE IF:  Any of the following symptoms occur over the next 12 hours: Shaking chills. You have a temperature by mouth above 102 F (38.9 C), not controlled by medicine. Chest, back, or muscle pain. People around you feel  you are not acting correctly or are confused. Shortness of breath or difficulty breathing. Dizziness and fainting. You get a rash or develop hives. You have a decrease in urine output. Your urine turns a dark color or changes to pink, red, or brown. Any of the following symptoms occur over the next 10 days: You have a temperature by mouth above 102 F (38.9 C), not controlled by medicine. Shortness of breath. Weakness after normal activity. The white part of the eye turns yellow (jaundice). You have a decrease in the amount of urine or are urinating less often. Your urine turns a dark color or changes to pink, red, or brown. Document Released: 12/03/2000 Document Revised: 02/28/2012 Document Reviewed: 07/22/2008 [facility] Patient Information 2014 [place], [place].  _______________________________________________________________________

## 2022-01-06 NOTE — Progress Notes (Addendum)
COVID swab appointment: n/a patient had positive home test 11/02/21  COVID Vaccine Completed: yes x3 Date COVID Vaccine completed: 01/24/20, 02/14/20 Has received booster: 11/12/20 COVID vaccine manufacturer: Pfizer      Date of COVID positive in last 90 days: home test + 11/02/21  PCP - [name], MD Cardiologist -   Medical clearance by [name] 11/20/21 on chart  Chest x-ray - 12/19/21 Epic EKG - 01/07/22 Epic/chart Stress Test - 15+ years ago per pt ECHO - n/a Cardiac Cath - n/a Pacemaker/ICD device last checked: n/a Spinal Cord Stimulator: n/a  Sleep Study - n/s CPAP -   Fasting Blood Sugar - no checks at home, pre DM Checks Blood Sugar _____ times a day  Blood Thinner Instructions: n/a Aspirin Instructions: Last Dose:  Activity level: Can go up a flight of stairs and perform activities of daily living without stopping and without symptoms of chest pain or shortness of breath.    Anesthesia review:   Patient denies shortness of breath, fever, cough and chest pain at PAT appointment   Patient verbalized understanding of instructions that were given to them at the PAT appointment. Patient was also instructed that they will need to review over the PAT instructions again at home before surgery.

## 2022-01-07 ENCOUNTER — Encounter (HOSPITAL_COMMUNITY)
Admission: RE | Admit: 2022-01-07 | Discharge: 2022-01-07 | Disposition: A | Discharge status: Home / Self Care | Payer: Medicare Other | Source: Ambulatory Visit | Attending: Orthopedic Surgery | Admitting: Orthopedic Surgery

## 2022-01-07 ENCOUNTER — Encounter (HOSPITAL_COMMUNITY): Payer: Self-pay

## 2022-01-07 ENCOUNTER — Other Ambulatory Visit: Payer: Self-pay

## 2022-01-07 VITALS — BP 132/78 | HR 105 | Temp 98.4°F | Resp 12 | Ht 66.0 in | Wt 165.4 lb

## 2022-01-07 DIAGNOSIS — M1611 Unilateral primary osteoarthritis, right hip: Secondary | ICD-10-CM | POA: Diagnosis not present

## 2022-01-07 DIAGNOSIS — Z01818 Encounter for other preprocedural examination: Secondary | ICD-10-CM | POA: Diagnosis present

## 2022-01-07 LAB — COMPREHENSIVE METABOLIC PANEL
ALT: 34 U/L (ref 0–44)
AST: 20 U/L (ref 15–41)
Albumin: 4 g/dL (ref 3.5–5.0)
Alkaline Phosphatase: 74 U/L (ref 38–126)
Anion gap: 9 (ref 5–15)
BUN: 22 mg/dL (ref 8–23)
CO2: 25 mmol/L (ref 22–32)
Calcium: 9.8 mg/dL (ref 8.9–10.3)
Chloride: 103 mmol/L (ref 98–111)
Creatinine, Ser: 0.92 mg/dL (ref 0.61–1.24)
GFR, Estimated: >60 mL/min (ref >60)
Glucose, Bld: 122 mg/dL — ABNORMAL HIGH (ref 70–99)
Potassium: 4.1 mmol/L (ref 3.5–5.1)
Sodium: 137 mmol/L (ref 135–145)
Total Bilirubin: 0.7 mg/dL (ref 0.3–1.2)
Total Protein: 7.2 g/dL (ref 6.5–8.1)

## 2022-01-07 LAB — TYPE AND SCREEN
ABO/RH(D): O POS
Antibody Screen: NEGATIVE

## 2022-01-07 LAB — SURGICAL PCR SCREEN
MRSA, PCR: NEGATIVE
Staphylococcus aureus: POSITIVE — AB

## 2022-01-07 LAB — CBC
HCT: 40.5 % (ref 39.0–52.0)
Hemoglobin: 14.2 g/dL (ref 13.0–17.0)
MCH: 32.4 pg (ref 26.0–34.0)
MCHC: 35.1 g/dL (ref 30.0–36.0)
MCV: 92.5 fL (ref 80.0–100.0)
Platelets: 287 10*3/uL (ref 150–400)
RBC: 4.38 MIL/uL (ref 4.22–5.81)
RDW: 12.8 % (ref 11.5–15.5)
WBC: 9.3 10*3/uL (ref 4.0–10.5)
nRBC: 0 % (ref 0.0–0.2)

## 2022-01-07 LAB — GLUCOSE, CAPILLARY: Glucose-Capillary: 139 mg/dL — ABNORMAL HIGH (ref 70–99)

## 2022-01-15 ENCOUNTER — Encounter (HOSPITAL_COMMUNITY): Payer: Medicare Other

## 2022-01-15 NOTE — H&P (Signed)
TOTAL HIP ADMISSION H&P  Patient is admitted for right total hip arthroplasty.  Subjective:  Chief Complaint: right hip pain  HPI: Christian Dodson, 76 y.o. male, has a history of pain and functional disability in the right hip(s) due to arthritis and patient has failed non-surgical conservative treatments for greater than 12 weeks to include NSAID's and/or analgesics and activity modification.  Onset of symptoms was gradual starting 2 years ago with gradually worsening course since that time.The patient noted no past surgery on the right hip(s).  Patient currently rates pain in the right hip at 8 out of 10 with activity. Patient has worsening of pain with activity and weight bearing, pain that interfers with activities of daily living, and pain with passive range of motion. Patient has evidence of joint space narrowing by imaging studies. This condition presents safety issues increasing the risk of falls. There is no current active infection.  Patient Active Problem List   Diagnosis Date Noted   Choledocholithiasis    Elevated LFTs    Dilated cbd, acquired 12/19/2021   Pre-diabetes 09/19/2020   Routine general medical examination at a health care facility 03/26/2015   HTN (hypertension) 10/19/2013   GERD (gastroesophageal reflux disease) 10/19/2013   BPH (benign prostatic hyperplasia) 10/19/2013   Past Medical History:  Diagnosis Date   Arthritis    BPH (benign prostatic hyperplasia) 10/19/2013   GERD (gastroesophageal reflux disease)    History of stomach ulcers    age 72   Hypertension    Pneumonia    x4   Pre-diabetes    Skin cancer     Past Surgical History:  Procedure Laterality Date   APPENDECTOMY     CHOLECYSTECTOMY N/A 12/21/2021   Procedure: LAPAROSCOPIC CHOLECYSTECTOMY;  Surgeon: Dwan Bolt, MD;  Location: [facility];  Service: General;  Laterality: N/A;   COLONOSCOPY  2010   [place]=normal exam per pt   ERCP N/A 12/20/2021   Procedure: ENDOSCOPIC RETROGRADE  CHOLANGIOPANCREATOGRAPHY (ERCP);  Surgeon: Ladene Artist, MD;  Location: [facility];  Service: Endoscopy;  Laterality: N/A;   INGUINAL HERNIA REPAIR Right    left shoulder rotator and bicep  01/10/2017   MEDIAL PARTIAL KNEE REPLACEMENT Right 2013   REMOVAL OF STONES  12/20/2021   Procedure: REMOVAL OF STONES;  Surgeon: Ladene Artist, MD;  Location: [facility];  Service: Endoscopy;;   SPHINCTEROTOMY  12/20/2021   Procedure: Joan Mayans;  Surgeon: Ladene Artist, MD;  Location: [facility];  Service: Endoscopy;;   TONSILLECTOMY AND ADENOIDECTOMY     TOTAL HIP ARTHROPLASTY Left 09/30/2020   Procedure: TOTAL HIP ARTHROPLASTY ANTERIOR APPROACH;  Surgeon: Paralee Cancel, MD;  Location: [facility];  Service: Orthopedics;  Laterality: Left;  70 mins   UPPER GI ENDOSCOPY      No current facility-administered medications for this encounter.   Current Outpatient Medications  Medication Sig Dispense Refill Last Dose   acetaminophen (TYLENOL) 650 MG CR tablet Take 650 mg by mouth See admin instructions. Take 2 tablets (1300 mg) by mouth daily at bedtime, may also take 2 tablets (1300 mg) two more times daily as needed for pain      Ascorbic Acid (VITAMIN C PO) Take 1 tablet by mouth every morning.      cetirizine (ZYRTEC) 10 MG tablet Take 10 mg by mouth every morning.      finasteride (PROSCAR) 5 MG tablet TAKE 1 TABLET DAILY (Patient taking differently: 5 mg at bedtime.) 90 tablet 2    GLUCOSAMINE-CHONDROITIN PO  Take 1 tablet by mouth every morning.      hydrochlorothiazide (HYDRODIURIL) 25 MG tablet Take 1 tablet (25 mg total) by mouth daily. Annual appt due in May must see provider for future refills (Patient taking differently: Take 25 mg by mouth every morning. Annual appt due in May must see provider for future refills) 90 tablet 2    lisinopril (ZESTRIL) 40 MG tablet TAKE 1 TABLET DAILY (Patient taking differently: Take 40 mg by mouth every morning.) 90 tablet 2    Multiple  Vitamins-Minerals (ZINC PO) Take 1 tablet by mouth every morning.      omeprazole (PRILOSEC) 20 MG capsule Take 20 mg by mouth daily.      ondansetron (ZOFRAN) 4 MG tablet Take 1 tablet (4 mg total) by mouth every 6 (six) hours as needed for nausea. (Patient not taking: Reported on 12/28/2021) 20 tablet 0    simvastatin (ZOCOR) 20 MG tablet Take 1 tablet (20 mg total) by mouth at bedtime. 90 tablet 3    VITAMIN D PO Take 1 tablet by mouth every morning.      No Known Allergies  Social History   Tobacco Use   Smoking status: Never   Smokeless tobacco: Never  Substance Use Topics   Alcohol use: Not Currently    Comment: occassionally- once a month per pt    Family History  Problem Relation Age of Onset   Colon polyps Mother    Hypertension Mother    Cancer Father        lung   Hyperlipidemia Maternal Grandfather    Heart disease Maternal Grandfather    Stroke Paternal Grandmother    Heart disease Paternal Grandmother    Hyperlipidemia Paternal Grandfather    Diabetes Neg Hx    Colon cancer Neg Hx    Esophageal cancer Neg Hx    Rectal cancer Neg Hx    Stomach cancer Neg Hx      Review of Systems  Constitutional:  Negative for chills and fever.  Respiratory:  Negative for cough and shortness of breath.   Cardiovascular:  Negative for chest pain.  Gastrointestinal:  Negative for nausea and vomiting.  Musculoskeletal:  Positive for arthralgias.    Objective:  Physical Exam Well nourished and well developed. General: Alert and oriented x3, cooperative and pleasant, no acute distress. Head: normocephalic, atraumatic, neck supple. Eyes: EOMI.  Musculoskeletal: Right hip exam: Mild tenderness around the right hip Limitations with active and passive hip flexion internal rotation with internal rotation less than 10 degrees and external rotation over 20 degrees with reproducible pain anteriorly Neurovascularly intact distally bilaterally without lower extremity edema, erythema  or calf tenderness  Calves soft and nontender. Motor function intact in LE. Strength 5/5 LE bilaterally. Neuro: Distal pulses 2+. Sensation to light touch intact in LE.  Vital signs in last 24 hours:    Labs:   Estimated body mass index is 26.7 kg/m as calculated from the following:   Height as of 01/07/22: 5\' 6"  (1.676 m).   Weight as of 01/07/22: 75 kg.   Imaging Review Plain radiographs demonstrate severe degenerative joint disease of the right hip(s). The bone quality appears to be adequate for age and reported activity level.      Assessment/Plan:  End stage arthritis, right hip(s)  The patient history, physical examination, clinical judgement of the provider and imaging studies are consistent with end stage degenerative joint disease of the right hip(s) and total hip arthroplasty is deemed medically necessary.  The treatment options including medical management, injection therapy, arthroscopy and arthroplasty were discussed at length. The risks and benefits of total hip arthroplasty were presented and reviewed. The risks due to aseptic loosening, infection, stiffness, dislocation/subluxation,  thromboembolic complications and other imponderables were discussed.  The patient acknowledged the explanation, agreed to proceed with the plan and consent was signed. Patient is being admitted for inpatient treatment for surgery, pain control, PT, OT, prophylactic antibiotics, VTE prophylaxis, progressive ambulation and ADL's and discharge planning.The patient is planning to be discharged  home.  Therapy Plans: HEP Disposition: Home with wife Planned DVT Prophylaxis: aspirin 81mg  BID DME needed: none PCP: Dr. Sharlet Salina, clearance received GI: Dr. Zenia Resides TXA: IV Allergies: NKDA Anesthesia Concerns: none BMI: 26.8 Last HgbA1c: pre-diabetic, a1c in the high 5's   Other: - Staying overnight - Had episodes of vasovagal with last hip - Norco, robaxin, celebrex, tylenol - Hx of  cholecystectomy on 1/2 (Dr. Zenia Resides - f/u on 1/24)  Costella Hatcher, PA-C Orthopedic Surgery [facility] [phone]

## 2022-01-16 NOTE — Progress Notes (Signed)
incredulous

## 2022-01-18 ENCOUNTER — Encounter (HOSPITAL_COMMUNITY): Payer: Self-pay | Admitting: Orthopedic Surgery

## 2022-01-18 NOTE — Anesthesia Preprocedure Evaluation (Addendum)
Anesthesia Evaluation  Patient identified by MRN, date of birth, ID band Patient awake    Reviewed: Allergy & Precautions, H&P , NPO status , Patient's Chart, lab work & pertinent test results  Airway Mallampati: III  TM Distance: >3 FB Neck ROM: Full    Dental no notable dental hx. (+) Teeth Intact, Dental Advisory Given   Pulmonary neg pulmonary ROS,    Pulmonary exam normal breath sounds clear to auscultation       Cardiovascular Exercise Tolerance: Good hypertension, Pt. on medications  Rhythm:Regular Rate:Normal     Neuro/Psych negative neurological ROS  negative psych ROS   GI/Hepatic Neg liver ROS, GERD  ,  Endo/Other  negative endocrine ROS  Renal/GU negative Renal ROS  negative genitourinary   Musculoskeletal  (+) Arthritis , Osteoarthritis,    Abdominal   Peds  Hematology negative hematology ROS (+)   Anesthesia Other Findings   Reproductive/Obstetrics negative OB ROS                            Anesthesia Physical Anesthesia Plan  ASA: 2  Anesthesia Plan: Spinal   Post-op Pain Management: Ofirmev IV (intra-op)   Induction: Intravenous  PONV Risk Score and Plan: 2 and Ondansetron, Dexamethasone and Propofol infusion  Airway Management Planned: Natural Airway and Simple Face Mask  Additional Equipment:   Intra-op Plan:   Post-operative Plan:   Informed Consent: I have reviewed the patients History and Physical, chart, labs and discussed the procedure including the risks, benefits and alternatives for the proposed anesthesia with the patient or authorized representative who has indicated his/her understanding and acceptance.     Dental advisory given  Plan Discussed with: CRNA  Anesthesia Plan Comments:       Anesthesia Quick Evaluation

## 2022-01-19 ENCOUNTER — Encounter (HOSPITAL_COMMUNITY): Payer: Self-pay | Admitting: Orthopedic Surgery

## 2022-01-19 ENCOUNTER — Ambulatory Visit (HOSPITAL_COMMUNITY): Payer: Medicare Other | Admitting: Anesthesiology

## 2022-01-19 ENCOUNTER — Other Ambulatory Visit: Payer: Self-pay

## 2022-01-19 ENCOUNTER — Ambulatory Visit (HOSPITAL_COMMUNITY): Payer: Medicare Other

## 2022-01-19 ENCOUNTER — Observation Stay (HOSPITAL_COMMUNITY)
Admission: RE | Admit: 2022-01-19 | Discharge: 2022-01-20 | Disposition: A | Discharge status: Home / Self Care | Payer: Medicare Other | Attending: Orthopedic Surgery | Admitting: Orthopedic Surgery

## 2022-01-19 ENCOUNTER — Observation Stay (HOSPITAL_COMMUNITY): Payer: Medicare Other

## 2022-01-19 ENCOUNTER — Encounter (HOSPITAL_COMMUNITY)
Admission: RE | Disposition: A | Discharge status: Home / Self Care | Payer: Self-pay | Source: Home / Self Care | Attending: Orthopedic Surgery

## 2022-01-19 DIAGNOSIS — Z96649 Presence of unspecified artificial hip joint: Secondary | ICD-10-CM

## 2022-01-19 DIAGNOSIS — Z85828 Personal history of other malignant neoplasm of skin: Secondary | ICD-10-CM | POA: Diagnosis not present

## 2022-01-19 DIAGNOSIS — M1611 Unilateral primary osteoarthritis, right hip: Principal | ICD-10-CM | POA: Insufficient documentation

## 2022-01-19 DIAGNOSIS — I10 Essential (primary) hypertension: Secondary | ICD-10-CM | POA: Insufficient documentation

## 2022-01-19 DIAGNOSIS — Z96642 Presence of left artificial hip joint: Secondary | ICD-10-CM | POA: Insufficient documentation

## 2022-01-19 DIAGNOSIS — Z79899 Other long term (current) drug therapy: Secondary | ICD-10-CM | POA: Diagnosis not present

## 2022-01-19 DIAGNOSIS — Z96651 Presence of right artificial knee joint: Secondary | ICD-10-CM | POA: Insufficient documentation

## 2022-01-19 DIAGNOSIS — Z96641 Presence of right artificial hip joint: Secondary | ICD-10-CM

## 2022-01-19 HISTORY — PX: TOTAL HIP ARTHROPLASTY: SHX124

## 2022-01-19 SURGERY — ARTHROPLASTY, HIP, TOTAL, ANTERIOR APPROACH
Anesthesia: Spinal | Site: Hip | Laterality: Right

## 2022-01-19 MED ORDER — LACTATED RINGERS IV SOLN: INTRAVENOUS | Status: DC

## 2022-01-19 MED ORDER — ACETAMINOPHEN 10 MG/ML IV SOLN
1000.0000 mg | Freq: Once | INTRAVENOUS | Status: AC
  Administered 2022-01-19: 1000 mg via INTRAVENOUS

## 2022-01-19 MED ORDER — FENTANYL CITRATE (PF) 100 MCG/2ML IJ SOLN
INTRAMUSCULAR | Status: AC
  Filled 2022-01-19: qty 2

## 2022-01-19 MED ORDER — POVIDONE-IODINE 10 % EX SWAB
2.0000 "application " | Freq: Once | CUTANEOUS | Status: AC
  Administered 2022-01-19: 2 via TOPICAL

## 2022-01-19 MED ORDER — ACETAMINOPHEN 325 MG PO TABS: 325.0000 mg | ORAL_TABLET | Freq: Four times a day (QID) | ORAL | Status: DC | PRN

## 2022-01-19 MED ORDER — ASPIRIN 81 MG PO CHEW
81.0000 mg | CHEWABLE_TABLET | Freq: Two times a day (BID) | ORAL | Status: DC
  Administered 2022-01-19 – 2022-01-20 (×2): 81 mg via ORAL
  Filled 2022-01-19 (×2): qty 1

## 2022-01-19 MED ORDER — SODIUM CHLORIDE 0.9 % IV SOLN: INTRAVENOUS | Status: DC

## 2022-01-19 MED ORDER — ONDANSETRON HCL 4 MG PO TABS
4.0000 mg | ORAL_TABLET | Freq: Four times a day (QID) | ORAL | Status: DC | PRN
  Filled 2022-01-19: qty 1

## 2022-01-19 MED ORDER — PROPOFOL 10 MG/ML IV BOLUS
INTRAVENOUS | Status: DC | PRN
  Administered 2022-01-19 (×2): 10 mg via INTRAVENOUS

## 2022-01-19 MED ORDER — CEFAZOLIN SODIUM-DEXTROSE 2-4 GM/100ML-% IV SOLN
INTRAVENOUS | Status: AC
  Filled 2022-01-19: qty 100

## 2022-01-19 MED ORDER — BISACODYL 10 MG RE SUPP: 10.0000 mg | Freq: Every day | RECTAL | Status: DC | PRN

## 2022-01-19 MED ORDER — PROPOFOL 1000 MG/100ML IV EMUL
INTRAVENOUS | Status: AC
  Filled 2022-01-19: qty 200

## 2022-01-19 MED ORDER — FERROUS SULFATE 325 (65 FE) MG PO TABS
325.0000 mg | ORAL_TABLET | Freq: Three times a day (TID) | ORAL | Status: DC
  Administered 2022-01-20 (×2): 325 mg via ORAL
  Filled 2022-01-19 (×2): qty 1

## 2022-01-19 MED ORDER — ONDANSETRON HCL 4 MG/2ML IJ SOLN
INTRAMUSCULAR | Status: DC | PRN
  Administered 2022-01-19: 4 mg via INTRAVENOUS

## 2022-01-19 MED ORDER — POLYETHYLENE GLYCOL 3350 17 G PO PACK: 17.0000 g | PACK | Freq: Every day | ORAL | Status: DC | PRN

## 2022-01-19 MED ORDER — METHOCARBAMOL 500 MG IVPB - SIMPLE MED
500.0000 mg | Freq: Four times a day (QID) | INTRAVENOUS | Status: DC | PRN
  Administered 2022-01-19: 500 mg via INTRAVENOUS
  Filled 2022-01-19: qty 50

## 2022-01-19 MED ORDER — PROPOFOL 10 MG/ML IV BOLUS
INTRAVENOUS | Status: AC
  Filled 2022-01-19: qty 20

## 2022-01-19 MED ORDER — LIDOCAINE 2% (20 MG/ML) 5 ML SYRINGE
INTRAMUSCULAR | Status: DC | PRN
  Administered 2022-01-19: 50 mg via INTRAVENOUS

## 2022-01-19 MED ORDER — FENTANYL CITRATE (PF) 100 MCG/2ML IJ SOLN
INTRAMUSCULAR | Status: DC | PRN
  Administered 2022-01-19 (×2): 50 ug via INTRAVENOUS

## 2022-01-19 MED ORDER — PHENYLEPHRINE HCL-NACL 20-0.9 MG/250ML-% IV SOLN
INTRAVENOUS | Status: DC | PRN
  Administered 2022-01-19: 25 ug/min via INTRAVENOUS

## 2022-01-19 MED ORDER — HYDROCODONE-ACETAMINOPHEN 7.5-325 MG PO TABS
1.0000 | ORAL_TABLET | ORAL | Status: DC | PRN
  Administered 2022-01-19: 2 via ORAL
  Filled 2022-01-19: qty 2

## 2022-01-19 MED ORDER — ONDANSETRON HCL 4 MG/2ML IJ SOLN: 4.0000 mg | Freq: Four times a day (QID) | INTRAMUSCULAR | Status: DC | PRN

## 2022-01-19 MED ORDER — METOCLOPRAMIDE HCL 5 MG PO TABS: 5.0000 mg | ORAL_TABLET | Freq: Three times a day (TID) | ORAL | Status: DC | PRN

## 2022-01-19 MED ORDER — METHOCARBAMOL 500 MG IVPB - SIMPLE MED
INTRAVENOUS | Status: AC
  Filled 2022-01-19: qty 50

## 2022-01-19 MED ORDER — TRANEXAMIC ACID-NACL 1000-0.7 MG/100ML-% IV SOLN
1000.0000 mg | INTRAVENOUS | Status: AC
  Administered 2022-01-19: 1000 mg via INTRAVENOUS
  Filled 2022-01-19: qty 100

## 2022-01-19 MED ORDER — HYDROCODONE-ACETAMINOPHEN 5-325 MG PO TABS: 2.0000 | ORAL_TABLET | Freq: Once | ORAL | Status: AC

## 2022-01-19 MED ORDER — PHENYLEPHRINE 40 MCG/ML (10ML) SYRINGE FOR IV PUSH (FOR BLOOD PRESSURE SUPPORT)
PREFILLED_SYRINGE | INTRAVENOUS | Status: DC | PRN
  Administered 2022-01-19 (×3): 80 ug via INTRAVENOUS

## 2022-01-19 MED ORDER — SODIUM CHLORIDE 0.9 % IR SOLN
Status: DC | PRN
  Administered 2022-01-19: 1000 mL

## 2022-01-19 MED ORDER — HYDROCODONE-ACETAMINOPHEN 5-325 MG PO TABS
1.0000 | ORAL_TABLET | ORAL | Status: DC | PRN
  Administered 2022-01-19: 2 via ORAL
  Administered 2022-01-20: 1 via ORAL
  Administered 2022-01-20 (×2): 2 via ORAL
  Filled 2022-01-19 (×4): qty 2

## 2022-01-19 MED ORDER — HYDROCHLOROTHIAZIDE 25 MG PO TABS
25.0000 mg | ORAL_TABLET | Freq: Every morning | ORAL | Status: DC
Start: 2022-01-20 — End: 2022-01-20

## 2022-01-19 MED ORDER — CEFAZOLIN SODIUM-DEXTROSE 2-4 GM/100ML-% IV SOLN
2.0000 g | INTRAVENOUS | Status: AC
  Administered 2022-01-19: 2 g via INTRAVENOUS
  Filled 2022-01-19: qty 100

## 2022-01-19 MED ORDER — MORPHINE SULFATE (PF) 2 MG/ML IV SOLN: 0.5000 mg | INTRAVENOUS | Status: DC | PRN

## 2022-01-19 MED ORDER — MIDAZOLAM HCL 5 MG/5ML IJ SOLN
INTRAMUSCULAR | Status: DC | PRN
  Administered 2022-01-19: 2 mg via INTRAVENOUS

## 2022-01-19 MED ORDER — TRANEXAMIC ACID-NACL 1000-0.7 MG/100ML-% IV SOLN: 1000.0000 mg | Freq: Once | INTRAVENOUS | Status: AC

## 2022-01-19 MED ORDER — LIDOCAINE HCL (PF) 2 % IJ SOLN
INTRAMUSCULAR | Status: AC
  Filled 2022-01-19: qty 10

## 2022-01-19 MED ORDER — HYDROMORPHONE HCL 1 MG/ML IJ SOLN: 0.2500 mg | INTRAMUSCULAR | Status: DC | PRN

## 2022-01-19 MED ORDER — CELECOXIB 200 MG PO CAPS
200.0000 mg | ORAL_CAPSULE | Freq: Two times a day (BID) | ORAL | Status: DC
  Administered 2022-01-19 – 2022-01-20 (×2): 200 mg via ORAL
  Filled 2022-01-19 (×2): qty 1

## 2022-01-19 MED ORDER — ACETAMINOPHEN 500 MG PO TABS
ORAL_TABLET | ORAL | Status: AC
  Filled 2022-01-19: qty 2

## 2022-01-19 MED ORDER — PROPOFOL 500 MG/50ML IV EMUL
INTRAVENOUS | Status: DC | PRN
  Administered 2022-01-19: 75 ug/kg/min via INTRAVENOUS

## 2022-01-19 MED ORDER — LORATADINE 10 MG PO TABS
10.0000 mg | ORAL_TABLET | Freq: Every day | ORAL | Status: DC
  Administered 2022-01-20: 10 mg via ORAL
  Filled 2022-01-19: qty 1

## 2022-01-19 MED ORDER — DIPHENHYDRAMINE HCL 12.5 MG/5ML PO ELIX: 12.5000 mg | ORAL_SOLUTION | ORAL | Status: DC | PRN

## 2022-01-19 MED ORDER — LIDOCAINE HCL (PF) 2 % IJ SOLN
INTRAMUSCULAR | Status: AC
  Filled 2022-01-19: qty 5

## 2022-01-19 MED ORDER — PHENYLEPHRINE HCL-NACL 20-0.9 MG/250ML-% IV SOLN
INTRAVENOUS | Status: AC
  Filled 2022-01-19: qty 750

## 2022-01-19 MED ORDER — HYDROCODONE-ACETAMINOPHEN 5-325 MG PO TABS
ORAL_TABLET | ORAL | Status: AC
  Administered 2022-01-19: 2 via ORAL
  Filled 2022-01-19: qty 2

## 2022-01-19 MED ORDER — DEXAMETHASONE SODIUM PHOSPHATE 10 MG/ML IJ SOLN
INTRAMUSCULAR | Status: AC
  Filled 2022-01-19: qty 3

## 2022-01-19 MED ORDER — CEFAZOLIN SODIUM-DEXTROSE 2-4 GM/100ML-% IV SOLN
2.0000 g | Freq: Four times a day (QID) | INTRAVENOUS | Status: AC
  Administered 2022-01-19 (×2): 2 g via INTRAVENOUS
  Filled 2022-01-19: qty 100

## 2022-01-19 MED ORDER — BUPIVACAINE IN DEXTROSE 0.75-8.25 % IT SOLN
INTRATHECAL | Status: DC | PRN
  Administered 2022-01-19: 1.6 mL via INTRATHECAL

## 2022-01-19 MED ORDER — LISINOPRIL 20 MG PO TABS: 40.0000 mg | ORAL_TABLET | Freq: Every morning | ORAL | Status: DC

## 2022-01-19 MED ORDER — METOCLOPRAMIDE HCL 5 MG/ML IJ SOLN: 5.0000 mg | Freq: Three times a day (TID) | INTRAMUSCULAR | Status: DC | PRN

## 2022-01-19 MED ORDER — TRANEXAMIC ACID-NACL 1000-0.7 MG/100ML-% IV SOLN
INTRAVENOUS | Status: AC
  Administered 2022-01-19: 1000 mg via INTRAVENOUS
  Filled 2022-01-19: qty 100

## 2022-01-19 MED ORDER — MENTHOL 3 MG MT LOZG: 1.0000 | LOZENGE | OROMUCOSAL | Status: DC | PRN

## 2022-01-19 MED ORDER — FINASTERIDE 5 MG PO TABS
5.0000 mg | ORAL_TABLET | Freq: Every day | ORAL | Status: DC
  Administered 2022-01-19: 5 mg via ORAL
  Filled 2022-01-19: qty 1

## 2022-01-19 MED ORDER — STERILE WATER FOR IRRIGATION IR SOLN
Status: DC | PRN
  Administered 2022-01-19: 2000 mL

## 2022-01-19 MED ORDER — DOCUSATE SODIUM 100 MG PO CAPS
100.0000 mg | ORAL_CAPSULE | Freq: Two times a day (BID) | ORAL | Status: DC
  Administered 2022-01-19 – 2022-01-20 (×2): 100 mg via ORAL
  Filled 2022-01-19 (×2): qty 1

## 2022-01-19 MED ORDER — ACETAMINOPHEN 10 MG/ML IV SOLN
INTRAVENOUS | Status: AC
  Filled 2022-01-19: qty 100

## 2022-01-19 MED ORDER — SIMVASTATIN 20 MG PO TABS
20.0000 mg | ORAL_TABLET | Freq: Every day | ORAL | Status: DC
  Administered 2022-01-19: 20 mg via ORAL
  Filled 2022-01-19: qty 1

## 2022-01-19 MED ORDER — MIDAZOLAM HCL 2 MG/2ML IJ SOLN
INTRAMUSCULAR | Status: AC
  Filled 2022-01-19: qty 2

## 2022-01-19 MED ORDER — PHENOL 1.4 % MT LIQD: 1.0000 | OROMUCOSAL | Status: DC | PRN

## 2022-01-19 MED ORDER — ORAL CARE MOUTH RINSE: 15.0000 mL | Freq: Once | OROMUCOSAL | Status: AC

## 2022-01-19 MED ORDER — DEXAMETHASONE SODIUM PHOSPHATE 10 MG/ML IJ SOLN
INTRAMUSCULAR | Status: DC | PRN
  Administered 2022-01-19: 4 mg via INTRAVENOUS

## 2022-01-19 MED ORDER — CHLORHEXIDINE GLUCONATE 0.12 % MT SOLN
15.0000 mL | Freq: Once | OROMUCOSAL | Status: AC
  Administered 2022-01-19: 15 mL via OROMUCOSAL

## 2022-01-19 MED ORDER — ONDANSETRON HCL 4 MG/2ML IJ SOLN
INTRAMUSCULAR | Status: AC
  Filled 2022-01-19: qty 6

## 2022-01-19 MED ORDER — DEXAMETHASONE SODIUM PHOSPHATE 10 MG/ML IJ SOLN: 8.0000 mg | Freq: Once | INTRAMUSCULAR | Status: DC

## 2022-01-19 MED ORDER — DEXAMETHASONE SODIUM PHOSPHATE 10 MG/ML IJ SOLN
10.0000 mg | Freq: Once | INTRAMUSCULAR | Status: AC
  Administered 2022-01-20: 10 mg via INTRAVENOUS
  Filled 2022-01-19: qty 1

## 2022-01-19 MED ORDER — CELECOXIB 200 MG PO CAPS
ORAL_CAPSULE | ORAL | Status: AC
  Administered 2022-01-19: 200 mg via ORAL
  Filled 2022-01-19: qty 1

## 2022-01-19 MED ORDER — PANTOPRAZOLE SODIUM 40 MG PO TBEC
40.0000 mg | DELAYED_RELEASE_TABLET | Freq: Every day | ORAL | Status: DC
  Administered 2022-01-20: 40 mg via ORAL
  Filled 2022-01-19: qty 1

## 2022-01-19 MED ORDER — METHOCARBAMOL 500 MG PO TABS
500.0000 mg | ORAL_TABLET | Freq: Four times a day (QID) | ORAL | Status: DC | PRN
Start: 2022-01-19 — End: 2022-01-20

## 2022-01-19 MED ORDER — PHENYLEPHRINE 40 MCG/ML (10ML) SYRINGE FOR IV PUSH (FOR BLOOD PRESSURE SUPPORT)
PREFILLED_SYRINGE | INTRAVENOUS | Status: AC
  Filled 2022-01-19: qty 10

## 2022-01-19 SURGICAL SUPPLY — 40 items
BAG COUNTER SPONGE SURGICOUNT (BAG) IMPLANT
BAG DECANTER FOR FLEXI CONT (MISCELLANEOUS) IMPLANT
BAG ZIPLOCK 12X15 (MISCELLANEOUS) IMPLANT
BLADE SAG 18X100X1.27 (BLADE) ×2 IMPLANT
COVER PERINEAL POST (MISCELLANEOUS) ×2 IMPLANT
COVER SURGICAL LIGHT HANDLE (MISCELLANEOUS) ×2 IMPLANT
CUP ACETBLR 54 OD PINNACLE (Hips) ×1 IMPLANT
DERMABOND ADVANCED (GAUZE/BANDAGES/DRESSINGS) ×1
DERMABOND ADVANCED .7 DNX12 (GAUZE/BANDAGES/DRESSINGS) ×1 IMPLANT
DRAPE FOOT SWITCH (DRAPES) ×2 IMPLANT
DRAPE STERI IOBAN 125X83 (DRAPES) ×2 IMPLANT
DRAPE U-SHAPE 47X51 STRL (DRAPES) ×4 IMPLANT
DRESSING AQUACEL AG SP 3.5X10 (GAUZE/BANDAGES/DRESSINGS) ×1 IMPLANT
DRSG AQUACEL AG SP 3.5X10 (GAUZE/BANDAGES/DRESSINGS) ×2
DURAPREP 26ML APPLICATOR (WOUND CARE) ×2 IMPLANT
ELECT REM PT RETURN 15FT ADLT (MISCELLANEOUS) ×2 IMPLANT
ELIMINATOR HOLE APEX DEPUY (Hips) ×1 IMPLANT
GAUZE 4X4 16PLY ~~LOC~~+RFID DBL (SPONGE) IMPLANT
GLOVE SURG ENC MOIS LTX SZ6 (GLOVE) ×2 IMPLANT
GLOVE SURG ENC MOIS LTX SZ7 (GLOVE) ×2 IMPLANT
GLOVE SURG UNDER LTX SZ6.5 (GLOVE) ×2 IMPLANT
GLOVE SURG UNDER POLY LF SZ7.5 (GLOVE) ×2 IMPLANT
GOWN STRL REUS W/TWL LRG LVL3 (GOWN DISPOSABLE) ×4 IMPLANT
HEAD CERAMIC DELTA 36 PLUS 1.5 (Hips) ×1 IMPLANT
HOLDER FOLEY CATH W/STRAP (MISCELLANEOUS) ×2 IMPLANT
KIT TURNOVER KIT A (KITS) IMPLANT
LINER NEUTRAL 54X36MM PLUS 4 (Hips) ×1 IMPLANT
PACK ANTERIOR HIP CUSTOM (KITS) ×2 IMPLANT
SCREW 6.5MMX30MM (Screw) ×1 IMPLANT
SPONGE T-LAP 18X18 ~~LOC~~+RFID (SPONGE) ×6 IMPLANT
STEM FEMORAL SZ5 HIGH ACTIS (Stem) ×1 IMPLANT
SUT MNCRL AB 4-0 PS2 18 (SUTURE) ×2 IMPLANT
SUT STRATAFIX 0 PDS 27 VIOLET (SUTURE) ×2
SUT VIC AB 1 CT1 36 (SUTURE) ×6 IMPLANT
SUT VIC AB 2-0 CT1 27 (SUTURE) ×2
SUT VIC AB 2-0 CT1 TAPERPNT 27 (SUTURE) ×2 IMPLANT
SUTURE STRATFX 0 PDS 27 VIOLET (SUTURE) ×1 IMPLANT
TRAY FOLEY MTR SLVR 16FR STAT (SET/KITS/TRAYS/PACK) IMPLANT
TUBE SUCTION HIGH CAP CLEAR NV (SUCTIONS) ×2 IMPLANT
WATER STERILE IRR 1000ML POUR (IV SOLUTION) ×2 IMPLANT

## 2022-01-19 NOTE — Anesthesia Postprocedure Evaluation (Signed)
Anesthesia Post Note  Patient: DERVIN VORE  Procedure(s) Performed: TOTAL HIP ARTHROPLASTY ANTERIOR APPROACH (Right: Hip)     Patient location during evaluation: PACU Anesthesia Type: Spinal Level of consciousness: oriented and awake and alert Pain management: pain level controlled Vital Signs Assessment: post-procedure vital signs reviewed and stable Respiratory status: spontaneous breathing and respiratory function stable Cardiovascular status: blood pressure returned to baseline and stable Postop Assessment: no headache, no backache, no apparent nausea or vomiting, spinal receding and patient able to bend at knees Anesthetic complications: no   No notable events documented.  Last Vitals:  Vitals:   01/19/22 1200 01/19/22 1300  BP: 114/69 118/72  Pulse: 77 83  Resp: 18 13  Temp:    SpO2: 100% 98%    Last Pain:  Vitals:   01/19/22 1300  TempSrc:   PainSc: 3                  Dalyn Becker,W. EDMOND

## 2022-01-19 NOTE — Evaluation (Signed)
Physical Therapy Evaluation Patient Details Name: Christian Dodson MRN: [id number] DOB: January 24, 1946 Today's Date: 01/19/2022  History of Present Illness  Patient is 76 y.o. male s/p R THA anterior approach on 01/19/22 with PMH significant for HTN, GERD, BPH, OA, Rt TKA in 2013, L THA 2021  Clinical Impression  Pt is s/p THA resulting in the deficits listed below (see PT Problem List). At baseline, pt is independent and enjoys playing golf.  He has support and DME and home and has had THA and TKA in past so familiar with the recovery/rehab.  Pt had hx of vasovagal response last THA - he had a drop in BP today but was asymptomatic (see general comments below).  Pt was able to transfer and ambulate with min guard and min cues.  Excellent pain control.  Expected to progress very well.  Pt will benefit from skilled PT to increase their independence and safety with mobility to allow discharge to the venue listed below.         Recommendations for follow up therapy are one component of a multi-disciplinary discharge planning process, led by the attending physician.  Recommendations may be updated based on patient status, additional functional criteria and insurance authorization.  Follow Up Recommendations Follow physician's recommendations for discharge plan and follow up therapies    Assistance Recommended at Discharge Intermittent Supervision/Assistance  Patient can return home with the following  A little help with walking and/or transfers;Help with stairs or ramp for entrance    Equipment Recommendations None recommended by PT  Recommendations for Other Services       Functional Status Assessment Patient has had a recent decline in their functional status and demonstrates the ability to make significant improvements in function in a reasonable and predictable amount of time.     Precautions / Restrictions Precautions Precautions: Fall Precaution Comments: hx vasovagal  postop Restrictions Weight Bearing Restrictions: Yes RLE Weight Bearing: Weight bearing as tolerated      Mobility  Bed Mobility Overal bed mobility: Needs Assistance Bed Mobility: Supine to Sit, Sit to Supine     Supine to sit: Supervision Sit to supine: Supervision        Transfers Overall transfer level: Needs assistance Equipment used: Rolling walker (2 wheels) Transfers: Sit to/from Stand Sit to Stand: Min guard           General transfer comment: cues for hand placement (slow transfers, had pt sit several mins before standing, performed ROM UE and LE) - no syncopal sxs    Ambulation/Gait Ambulation/Gait assistance: Min guard Gait Distance (Feet): 80 Feet Assistive device: Rolling walker (2 wheels) Gait Pattern/deviations: Step-through pattern, Decreased stride length Gait velocity: decreased     General Gait Details: min cues for sequencing  Stairs            Wheelchair Mobility    Modified Rankin (Stroke Patients Only)       Balance Overall balance assessment: Needs assistance Sitting-balance support: No upper extremity supported Sitting balance-Leahy Scale: Good     Standing balance support: Bilateral upper extremity supported, No upper extremity supported Standing balance-Leahy Scale: Fair Standing balance comment: could static stand without but RW to ambulate                             Pertinent Vitals/Pain Pain Assessment Pain Assessment: 0-10 Pain Score: 3  Pain Location: R hip Pain Descriptors / Indicators: Aching, Throbbing Pain Intervention(s): Limited activity within  patient's tolerance, Monitored during session    Home Living Family/patient expects to be discharged to:: Private residence Living Arrangements: Spouse/significant other Available Help at Discharge: Family;Available 24 hours/day Type of Home: House Home Access: Stairs to enter Entrance Stairs-Rails: None Entrance Stairs-Number of Steps: 1    Home Layout: Multi-level;Able to live on main level with bedroom/bathroom Home Equipment: Rolling Walker (2 wheels);Cane - single point;BSC/3in1;Shower seat      Prior Function Prior Level of Function : Independent/Modified Independent;Driving             Mobility Comments: Likes to walk and play golf; no AD used       Hand Dominance        Extremity/Trunk Assessment   Upper Extremity Assessment Upper Extremity Assessment: Overall WFL for tasks assessed    Lower Extremity Assessment Lower Extremity Assessment: LLE deficits/detail;RLE deficits/detail RLE Deficits / Details: ROM: WFL; MMT: ankle 5/5; knee 5/5, hip 3/5 not further tested RLE Sensation: WNL LLE Deficits / Details: ROM WFL; MMT 5/5 LLE Sensation: WNL    Cervical / Trunk Assessment Cervical / Trunk Assessment: Normal  Communication   Communication: No difficulties  Cognition Arousal/Alertness: Awake/alert Behavior During Therapy: WFL for tasks assessed/performed Overall Cognitive Status: Within Functional Limits for tasks assessed                                          General Comments General comments (skin integrity, edema, etc.): Pt with hx of vasovagal response. Took transfers slow with vitals monitored and ROM performed in sitting prior to standing.  Blood pressures as follows: Supine 131/76 Sitting 128/79 Standing 104/85 (asymptomatic) Standing 3 mins with weight shifting 75/45 but completely asymptomatic, had pt return to sitting Siting 88/72 Walked without symptoms and BP 128/75 sitting post walk     Exercises     Assessment/Plan    PT Assessment Patient needs continued PT services  PT Problem List Decreased strength;Decreased mobility;Decreased range of motion;Decreased activity tolerance;Decreased balance;Decreased knowledge of use of DME;Pain       PT Treatment Interventions DME instruction;Therapeutic activities;Gait training;Therapeutic  exercise;Patient/family education;Stair training;Balance training;Functional mobility training;Modalities    PT Goals (Current goals can be found in the Care Plan section)  Acute Rehab PT Goals Patient Stated Goal: return home PT Goal Formulation: With patient Time For Goal Achievement: 02/02/22 Potential to Achieve Goals: Good    Frequency 7X/week     Co-evaluation               AM-PAC PT "6 Clicks" Mobility  Outcome Measure Help needed turning from your back to your side while in a flat bed without using bedrails?: None Help needed moving from lying on your back to sitting on the side of a flat bed without using bedrails?: A Little Help needed moving to and from a bed to a chair (including a wheelchair)?: A Little Help needed standing up from a chair using your arms (e.g., wheelchair or bedside chair)?: A Little Help needed to walk in hospital room?: A Little Help needed climbing 3-5 steps with a railing? : A Little 6 Click Score: 19    End of Session Equipment Utilized During Treatment: Gait belt Activity Tolerance: Patient tolerated treatment well Patient left: in bed;with call bell/phone within reach (in PACU) Nurse Communication: Mobility status PT Visit Diagnosis: Other abnormalities of gait and mobility (R26.89);Muscle weakness (generalized) (M62.81)    Time:  5750-5183 PT Time Calculation (min) (ACUTE ONLY): 30 min   Charges:   PT Evaluation $PT Eval Moderate Complexity: 1 Mod PT Treatments $Gait Training: 8-22 mins        Abran Richard, PT Acute Rehab Services Pager [phone] [facility] [place] 01/19/2022, 4:00 PM

## 2022-01-19 NOTE — Discharge Instructions (Signed)

## 2022-01-19 NOTE — Transfer of Care (Signed)
Immediate Anesthesia Transfer of Care Note  Patient: Christian Dodson  Procedure(s) Performed: Procedure(s): TOTAL HIP ARTHROPLASTY ANTERIOR APPROACH (Right)  Patient Location: PACU  Anesthesia Type:Spinal  Level of Consciousness:  sedated, patient cooperative and responds to stimulation  Airway & Oxygen Therapy:Patient Spontanous Breathing and Patient connected to face mask oxgen  Post-op Assessment:  Report given to PACU RN and Post -op Vital signs reviewed and stable  Post vital signs:  Reviewed and stable  Last Vitals:  Vitals:   01/19/22 0651  BP: 136/84  Pulse: 79  Temp: 36.7 C  SpO2: 35%    Complications: No apparent anesthesia complications

## 2022-01-19 NOTE — Op Note (Signed)
NAME:  Christian Dodson                ACCOUNT NO.: [id number]      MEDICAL RECORD NO.: [id number]      FACILITY:  [facility]      PHYSICIAN:  Mauri Pole  DATE OF BIRTH:  04-13-1946     DATE OF PROCEDURE:  01/19/2022                                 OPERATIVE REPORT         PREOPERATIVE DIAGNOSIS: Right  hip osteoarthritis.      POSTOPERATIVE DIAGNOSIS:  Right hip osteoarthritis.      PROCEDURE:  Right total hip replacement through an anterior approach   utilizing DePuy THR system, component size 54 mm pinnacle cup, a size 36+4 neutral   Altrex liner, a size 5 Hi Actis stem with a 36+1.5 delta ceramic   ball.      SURGEON:  Pietro Cassis. Alvan Dame, M.D.      ASSISTANT:  Costella Hatcher, PA-C     ANESTHESIA:  Spinal.      SPECIMENS:  None.      COMPLICATIONS:  None.      BLOOD LOSS:  1000 cc     DRAINS:  None.      INDICATION OF THE PROCEDURE:  Christian Dodson is a 76 y.o. male who had   presented to office for evaluation of right hip pain.  Radiographs revealed   progressive degenerative changes with bone-on-bone   articulation of the  hip joint, including subchondral cystic changes and osteophytes.  The patient had painful limited range of   motion significantly affecting their overall quality of life and function.  The patient was failing to    respond to conservative measures including medications and/or injections and activity modification and at this point was ready   to proceed with more definitive measures.  Consent was obtained for   benefit of pain relief.  Specific risks of infection, DVT, component   failure, dislocation, neurovascular injury, and need for revision surgery were reviewed in the office as well discussion of   the anterior versus posterior approach were reviewed.     PROCEDURE IN DETAIL:  The patient was brought to operative theater.   Once adequate anesthesia, preoperative antibiotics, 2 gm of Ancef, 1 gm of Tranexamic Acid, and 10  mg of Decadron were administered, the patient was positioned supine on the [employer] table.  Once the patient was safely positioned with adequate padding of boney prominences we predraped out the hip, and used fluoroscopy to confirm orientation of the pelvis.      The right hip was then prepped and draped from proximal iliac crest to   mid thigh with a shower curtain technique.      Time-out was performed identifying the patient, planned procedure, and the appropriate extremity.     An incision was then made 2 cm lateral to the   anterior superior iliac spine extending over the orientation of the   tensor fascia lata muscle and sharp dissection was carried down to the   fascia of the muscle.      The fascia was then incised.  The muscle belly was identified and swept   laterally and retractor placed along the superior neck.  Following   cauterization of the circumflex vessels and removing some  pericapsular   fat, a second cobra retractor was placed on the inferior neck.  A T-capsulotomy was made along the line of the   superior neck to the trochanteric fossa, then extended proximally and   distally.  Tag sutures were placed and the retractors were then placed   intracapsular.  We then identified the trochanteric fossa and   orientation of my neck cut and then made a neck osteotomy with the femur on traction.  The femoral   head was removed without difficulty or complication.  Traction was let   off and retractors were placed posterior and anterior around the   acetabulum.      The labrum and foveal tissue were debrided.  I began reaming with a 49 mm   reamer and reamed up to 53 mm reamer with good bony bed preparation and a 54 mm  cup was chosen.  The final 54 mm Pinnacle cup was then impacted under fluoroscopy to confirm the depth of penetration and orientation with respect to   Abduction and forward flexion.  A screw was placed into the ilium followed by the hole eliminator.  The final    36+4 neutral Altrex liner was impacted with good visualized rim fit.  The cup was positioned anatomically within the acetabular portion of the pelvis.      At this point, the femur was rolled to 100 degrees.  Further capsule was   released off the inferior aspect of the femoral neck.  I then   released the superior capsule proximally.  With the leg in a neutral position the hook was placed laterally   along the femur under the vastus lateralis origin and elevated manually and then held in position using the hook attachment on the bed.  The leg was then extended and adducted with the leg rolled to 100   degrees of external rotation.  Retractors were placed along the medial calcar and posteriorly over the greater trochanter.  Once the proximal femur was fully   exposed, I used a box osteotome to set orientation.  I then began   broaching with the starting chili pepper broach and passed this by hand and then broached up to 5.  With the 5 broach in place I chose a high offset neck and did several trial reductions.  The offset was appropriate, leg lengths   appeared to be equal best matched with the +1.5 head ball trial confirmed radiographically.   Given these findings, I went ahead and dislocated the hip, repositioned all   retractors and positioned the right hip in the extended and abducted position.  The final 5 Hi Actis stem was   chosen and it was impacted down to the level of neck cut.  Based on this   and the trial reductions, a final 36+1.5 delta ceramic ball was chosen and   impacted onto a clean and dry trunnion, and the hip was reduced.  The   hip had been irrigated throughout the case again at this point.  I did   reapproximate the superior capsular leaflet to the anterior leaflet   using #1 Vicryl.  The fascia of the   tensor fascia lata muscle was then reapproximated using #1 Vicryl and #0 Stratafix sutures.  The   remaining wound was closed with 2-0 Vicryl and running 4-0 Monocryl.    The hip was cleaned, dried, and dressed sterilely using Dermabond and   Aquacel dressing.  The patient was then brought   to  recovery room in stable condition tolerating the procedure well.    Costella Hatcher, PA-C was present for the entirety of the case involved from   preoperative positioning, perioperative retractor management, general   facilitation of the case, as well as primary wound closure as assistant.            Pietro Cassis Alvan Dame, M.D.        01/19/2022 8:40 AM

## 2022-01-19 NOTE — Plan of Care (Signed)

## 2022-01-19 NOTE — Interval H&P Note (Signed)
History and Physical Interval Note:  01/19/2022 7:12 AM  Christian Dodson  has presented today for surgery, with the diagnosis of Right hip osteoarthritis.  The various methods of treatment have been discussed with the patient and family. After consideration of risks, benefits and other options for treatment, the patient has consented to  Procedure(s): TOTAL HIP ARTHROPLASTY ANTERIOR APPROACH (Right) as a surgical intervention.  The patient's history has been reviewed, patient examined, no change in status, stable for surgery.  I have reviewed the patient's chart and labs.  Questions were answered to the patient's satisfaction.     Mauri Pole

## 2022-01-19 NOTE — Anesthesia Procedure Notes (Signed)
Spinal  Patient location during procedure: OR Start time: 01/19/2022 8:40 AM End time: 01/19/2022 8:44 AM Reason for block: surgical anesthesia Staffing Performed: anesthesiologist  Anesthesiologist: Roderic Palau, MD Preanesthetic Checklist Completed: patient identified, IV checked, risks and benefits discussed, surgical consent, monitors and equipment checked, pre-op evaluation and timeout performed Spinal Block Patient position: sitting Prep: DuraPrep Patient monitoring: cardiac monitor, continuous pulse ox and blood pressure Approach: midline Location: L3-4 Injection technique: single-shot Needle Needle type: Pencan  Needle gauge: 24 G Needle length: 9 cm Assessment Sensory level: T8 Events: CSF return Additional Notes Functioning IV was confirmed and monitors were applied. Sterile prep and drape, including hand hygiene and sterile gloves were used. The patient was positioned and the spine was prepped. The skin was anesthetized with lidocaine.  Free flow of clear CSF was obtained prior to injecting local anesthetic into the CSF.  The spinal needle aspirated freely following injection.  The needle was carefully withdrawn.  The patient tolerated the procedure well.

## 2022-01-20 ENCOUNTER — Encounter (HOSPITAL_COMMUNITY): Payer: Self-pay | Admitting: Orthopedic Surgery

## 2022-01-20 DIAGNOSIS — M1611 Unilateral primary osteoarthritis, right hip: Secondary | ICD-10-CM | POA: Diagnosis not present

## 2022-01-20 LAB — BASIC METABOLIC PANEL
Anion gap: 6 (ref 5–15)
BUN: 19 mg/dL (ref 8–23)
CO2: 24 mmol/L (ref 22–32)
Calcium: 8.5 mg/dL — ABNORMAL LOW (ref 8.9–10.3)
Chloride: 106 mmol/L (ref 98–111)
Creatinine, Ser: 0.95 mg/dL (ref 0.61–1.24)
GFR, Estimated: >60 mL/min (ref >60)
Glucose, Bld: 152 mg/dL — ABNORMAL HIGH (ref 70–99)
Potassium: 3.8 mmol/L (ref 3.5–5.1)
Sodium: 136 mmol/L (ref 135–145)

## 2022-01-20 LAB — CBC
HCT: 27.6 % — ABNORMAL LOW (ref 39.0–52.0)
Hemoglobin: 9.7 g/dL — ABNORMAL LOW (ref 13.0–17.0)
MCH: 32.8 pg (ref 26.0–34.0)
MCHC: 35.1 g/dL (ref 30.0–36.0)
MCV: 93.2 fL (ref 80.0–100.0)
Platelets: 132 10*3/uL — ABNORMAL LOW (ref 150–400)
RBC: 2.96 MIL/uL — ABNORMAL LOW (ref 4.22–5.81)
RDW: 13.8 % (ref 11.5–15.5)
WBC: 11.5 10*3/uL — ABNORMAL HIGH (ref 4.0–10.5)
nRBC: 0 % (ref 0.0–0.2)

## 2022-01-20 MED ORDER — SODIUM CHLORIDE 0.9 % IV BOLUS
250.0000 mL | Freq: Once | INTRAVENOUS | Status: AC
  Administered 2022-01-20: 250 mL via INTRAVENOUS

## 2022-01-20 MED ORDER — HYDROCODONE-ACETAMINOPHEN 5-325 MG PO TABS: 1.0000 | ORAL_TABLET | Freq: Four times a day (QID) | ORAL | 0 refills | Status: DC | PRN

## 2022-01-20 MED ORDER — DOCUSATE SODIUM 100 MG PO CAPS: 100.0000 mg | ORAL_CAPSULE | Freq: Two times a day (BID) | ORAL | 0 refills | Status: DC

## 2022-01-20 MED ORDER — METHOCARBAMOL 500 MG PO TABS: 500.0000 mg | ORAL_TABLET | Freq: Four times a day (QID) | ORAL | 0 refills | Status: DC | PRN

## 2022-01-20 MED ORDER — ASPIRIN 81 MG PO CHEW: 81.0000 mg | CHEWABLE_TABLET | Freq: Two times a day (BID) | ORAL | 0 refills | Status: AC

## 2022-01-20 MED ORDER — POLYETHYLENE GLYCOL 3350 17 G PO PACK: 17.0000 g | PACK | Freq: Every day | ORAL | 0 refills | Status: DC | PRN

## 2022-01-20 MED ORDER — CELECOXIB 200 MG PO CAPS: 200.0000 mg | ORAL_CAPSULE | Freq: Two times a day (BID) | ORAL | 0 refills | Status: DC

## 2022-01-20 NOTE — Progress Notes (Signed)
Physical Therapy Treatment Patient Details Name: DELRON COMER MRN: [id number] DOB: [birth year] Today's Date: 01/20/2022   History of Present Illness Patient is 76 y.o. male s/p R THA anterior approach on 01/19/22 with PMH significant for HTN, GERD, BPH, OA, Rt TKA in 2013, L THA 2021    PT Comments    Pt is POD # 1 and is progressing well.  He ambulated 150' with RW with near normal gait pattern and performed stairs with correct sequencing without cues.   Pt's blood pressures were stable with no syncopal symptoms. Pt demonstrates safe gait & transfers in order to return home from PT perspective once discharged by MD.  While in hospital, will continue to benefit from PT for skilled therapy to advance mobility and exercises.      Recommendations for follow up therapy are one component of a multi-disciplinary discharge planning process, led by the attending physician.  Recommendations may be updated based on patient status, additional functional criteria and insurance authorization.  Follow Up Recommendations  Follow physician's recommendations for discharge plan and follow up therapies     Assistance Recommended at Discharge Intermittent Supervision/Assistance  Patient can return home with the following A little help with walking and/or transfers;Help with stairs or ramp for entrance   Equipment Recommendations  None recommended by PT    Recommendations for Other Services       Precautions / Restrictions Precautions Precautions: Fall Restrictions Weight Bearing Restrictions: Yes RLE Weight Bearing: Weight bearing as tolerated     Mobility  Bed Mobility Overal bed mobility: Needs Assistance Bed Mobility: Supine to Sit     Supine to sit: Supervision          Transfers Overall transfer level: Needs assistance Equipment used: Rolling walker (2 wheels) Transfers: Sit to/from Stand Sit to Stand: Supervision                Ambulation/Gait Ambulation/Gait assistance:  Min guard, Supervision Gait Distance (Feet): 150 Feet Assistive device: Rolling walker (2 wheels) Gait Pattern/deviations: Step-through pattern, Decreased stride length Gait velocity: decreased     General Gait Details: Min guard progressed to supervision; only minimal decrease in stance time on R   Stairs Stairs: Yes Stairs assistance: Min guard Stair Management: Two rails, With walker, Step to pattern, Forwards Number of Stairs: 6 General stair comments: 5 steps with 2 rails and 1 step with RW; pt recalling correct cues   Wheelchair Mobility    Modified Rankin (Stroke Patients Only)       Balance Overall balance assessment: Needs assistance Sitting-balance support: No upper extremity supported Sitting balance-Leahy Scale: Good     Standing balance support: Bilateral upper extremity supported, No upper extremity supported Standing balance-Leahy Scale: Good Standing balance comment: could static stand without but RW to ambulate                            Cognition Arousal/Alertness: Awake/alert Behavior During Therapy: WFL for tasks assessed/performed Overall Cognitive Status: Within Functional Limits for tasks assessed                                          Exercises Total Joint Exercises Ankle Circles/Pumps: AROM, Both, 10 reps, Supine Quad Sets: AROM, Both, 5 reps, Supine Heel Slides: AROM, Right, 5 reps, Supine Hip ABduction/ADduction: AROM, Right, 5 reps, Supine Long Arc  Quad: AROM, Right, 5 reps, Seated    General Comments General comments (skin integrity, edema, etc.): blood pressures were stable in all positions  Educated on safe ice use, no pivots, car transfers, and TED hose during day. Also, encouraged walking every 1-2 hours during day. Educated on HEP with focus on mobility the first weeks. Discussed doing exercises within pain control and if pain increasing could decreased ROM, reps, and stop exercises as needed.  Encouraged to perform quad sets and ankle pumps frequently for blood flow and to promote full knee extension.      Pertinent Vitals/Pain Pain Assessment Pain Assessment: 0-10 Pain Score: 2  Pain Location: R hip Pain Descriptors / Indicators: Aching, Throbbing Pain Intervention(s): Limited activity within patient's tolerance, Monitored during session    Home Living                          Prior Function            PT Goals (current goals can now be found in the care plan section) Progress towards PT goals: Progressing toward goals    Frequency    7X/week      PT Plan Current plan remains appropriate    Co-evaluation              AM-PAC PT "6 Clicks" Mobility   Outcome Measure  Help needed turning from your back to your side while in a flat bed without using bedrails?: None Help needed moving from lying on your back to sitting on the side of a flat bed without using bedrails?: A Little Help needed moving to and from a bed to a chair (including a wheelchair)?: A Little Help needed standing up from a chair using your arms (e.g., wheelchair or bedside chair)?: A Little Help needed to walk in hospital room?: A Little Help needed climbing 3-5 steps with a railing? : A Little 6 Click Score: 19    End of Session Equipment Utilized During Treatment: Gait belt Activity Tolerance: Patient tolerated treatment well Patient left: in chair;with call bell/phone within reach;with chair alarm set Nurse Communication: Mobility status PT Visit Diagnosis: Other abnormalities of gait and mobility (R26.89);Muscle weakness (generalized) (M62.81)     Time: 4696-2952 PT Time Calculation (min) (ACUTE ONLY): 23 min  Charges:  $Gait Training: 8-22 mins $Therapeutic Exercise: 8-22 mins                     Abran Richard, PT Acute Rehab Services Pager [phone] [facility] [place] 01/20/2022, 11:24 AM

## 2022-01-20 NOTE — TOC Transition Note (Signed)
Transition of Care [facility]) - CM/SW Discharge Note   Patient Details  Name: Christian Dodson MRN: [id number] Date of Birth: February 02, 1946  [facility] of [facility]) CM/SW Contact:  Lennart Pall, LCSW Phone Number: 01/20/2022, 9:34 AM   Clinical Narrative:    Met with pt and confirming he has all needed DME at home. Plan HEP. No TOC needs.   Final next level of care: Home/Self Care Barriers to Discharge: No Barriers Identified   Patient Goals and CMS Choice Patient states their goals for this hospitalization and ongoing recovery are:: return home      Discharge Placement                       Discharge Plan and Services                DME Arranged: N/A DME Agency: NA                  Social Determinants of Health (SDOH) Interventions     Readmission Risk Interventions No flowsheet data found.

## 2022-01-20 NOTE — Progress Notes (Signed)
° °  Subjective: 1 Day Post-Op Procedure(s) (LRB): TOTAL HIP ARTHROPLASTY ANTERIOR APPROACH (Right) Patient reports pain as mild.   Patient seen in rounds by Dr. Alvan Dame. Patient is resting in bed. No acute events overnight. Foley catheter removed. Ambulated 80 feet with PT. No vasovagal issues. We will continue therapy today.   Objective: Vital signs in last 24 hours: Temp:  [97.7 F (36.5 C)-98.3 F (36.8 C)] 97.8 F (36.6 C) (02/01 0527) Pulse Rate:  [69-91] 82 (02/01 0527) Resp:  [10-18] 17 (02/01 0527) BP: (99-129)/(51-79) 117/73 (02/01 0527) SpO2:  [94 %-100 %] 98 % (02/01 0527)  Intake/Output from previous day:  Intake/Output Summary (Last 24 hours) at 01/20/2022 0709 Last data filed at 01/20/2022 0536 Gross per 24 hour  Intake 2494.92 ml  Output 4725 ml  Net -2230.08 ml     Intake/Output this shift: No intake/output data recorded.  Labs: Recent Labs    01/20/22 0259  HGB 9.7*   Recent Labs    01/20/22 0259  WBC 11.5*  RBC 2.96*  HCT 27.6*  PLT 132*   Recent Labs    01/20/22 0259  NA 136  K 3.8  CL 106  CO2 24  BUN 19  CREATININE 0.95  GLUCOSE 152*  CALCIUM 8.5*   No results for input(s): LABPT, INR in the last 72 hours.  Exam: General - Patient is Alert and Oriented Extremity - Neurologically intact Sensation intact distally Intact pulses distally Dorsiflexion/Plantar flexion intact Dressing - dressing C/D/I Motor Function - intact, moving foot and toes well on exam.   Past Medical History:  Diagnosis Date   Arthritis    BPH (benign prostatic hyperplasia) 10/19/2013   GERD (gastroesophageal reflux disease)    History of stomach ulcers    age 55   Hypertension    Pneumonia    x4   Pre-diabetes    Skin cancer     Assessment/Plan: 1 Day Post-Op Procedure(s) (LRB): TOTAL HIP ARTHROPLASTY ANTERIOR APPROACH (Right) Principal Problem:   S/P total right hip arthroplasty  Estimated body mass index is 26.7 kg/m as calculated from the  following:   Height as of this encounter: 5\' 6"  (1.676 m).   Weight as of this encounter: 75 kg. Advance diet Up with therapy D/C IV fluids  DVT Prophylaxis - Aspirin Weight bearing as tolerated.  Plan is to go Home after hospital stay. Plan for discharge home today after 1-2 sessions of therapy if he is meeting his goals. Follow up in the office in 2 weeks.   Griffith Citron, PA-C Orthopedic Surgery [phone] 01/20/2022, 7:09 AM

## 2022-01-20 NOTE — Plan of Care (Signed)
  Problem: Pain Managment: Goal: General experience of comfort will improve Outcome: Progressing   Problem: Safety: Goal: Ability to remain free from injury will improve Outcome: Progressing   

## 2022-01-20 NOTE — Progress Notes (Signed)
Discharge package printed and instructions given to pt. Patient verbalizes understanding. 

## 2022-01-22 ENCOUNTER — Telehealth: Payer: Self-pay

## 2022-01-22 MED ORDER — SIMVASTATIN 20 MG PO TABS: 20.0000 mg | ORAL_TABLET | Freq: Every day | ORAL | 3 refills | Status: DC

## 2022-01-22 NOTE — Telephone Encounter (Signed)
Pt requesting simvastatin (ZOCOR) 20 MG tablet refill sent to:   [facility] ([facility] ) - [place], [place]

## 2022-01-22 NOTE — Telephone Encounter (Signed)
Refill has been sent to pt's mail order pharmacy.  °

## 2022-01-22 NOTE — Telephone Encounter (Signed)
Pt calling in requesting a refill on: simvastatin (ZOCOR) 20 MG tablet   Pharmacy: [facility]  LOV: 12/28/21

## 2022-01-28 NOTE — Discharge Summary (Signed)
Patient ID: Christian Dodson MRN: [id number] DOB/AGE: [birth year] 74 y.o.  Admit date: 01/19/2022 Discharge date: 01/20/2022  Admission Diagnoses:  Right hip osteoarthritis  Discharge Diagnoses:  Principal Problem:   S/P total right hip arthroplasty   Past Medical History:  Diagnosis Date   Arthritis    BPH (benign prostatic hyperplasia) 10/19/2013   GERD (gastroesophageal reflux disease)    History of stomach ulcers    age 31   Hypertension    Pneumonia    x4   Pre-diabetes    Skin cancer     Surgeries: Procedure(s): TOTAL HIP ARTHROPLASTY ANTERIOR APPROACH on 01/19/2022   Consultants:   Discharged Condition: Improved  Hospital Course: Christian Dodson is an 76 y.o. male who was admitted 01/19/2022 for operative treatment ofS/P total right hip arthroplasty. Patient has severe unremitting pain that affects sleep, daily activities, and work/hobbies. After pre-op clearance the patient was taken to the operating room on 01/19/2022 and underwent  Procedure(s): TOTAL HIP ARTHROPLASTY ANTERIOR APPROACH.    Patient was given perioperative antibiotics:  Anti-infectives (From admission, onward)    Start     Dose/Rate Route Frequency Ordered Stop   01/19/22 1432  ceFAZolin (ANCEF) 2-4 GM/100ML-% IVPB       Note to Pharmacy: Domingo Mend D: cabinet override      01/19/22 1432 01/20/22 0244   01/19/22 1430  ceFAZolin (ANCEF) IVPB 2g/100 mL premix        2 g 200 mL/hr over 30 Minutes Intravenous Every 6 hours 01/19/22 1327 01/19/22 2026   01/19/22 0630  ceFAZolin (ANCEF) IVPB 2g/100 mL premix        2 g 200 mL/hr over 30 Minutes Intravenous On call to O.R. 01/19/22 6720 01/19/22 0843        Patient was given sequential compression devices, early ambulation, and chemoprophylaxis to prevent DVT. Patient worked with PT and was meeting their goals regarding safe ambulation and transfers.  Patient benefited maximally from hospital stay and there were no complications.    Recent  vital signs: No data found.   Recent laboratory studies: No results for input(s): WBC, HGB, HCT, PLT, NA, K, CL, CO2, BUN, CREATININE, GLUCOSE, INR, CALCIUM in the last 72 hours.  Invalid input(s): PT, 2   Discharge Medications:   Allergies as of 01/20/2022   No Known Allergies      Medication List     STOP taking these medications    acetaminophen 650 MG CR tablet Commonly known as: TYLENOL   ondansetron 4 MG tablet Commonly known as: ZOFRAN       TAKE these medications    aspirin 81 MG chewable tablet Chew 1 tablet (81 mg total) by mouth 2 (two) times daily for 28 days.   celecoxib 200 MG capsule Commonly known as: CELEBREX Take 1 capsule (200 mg total) by mouth 2 (two) times daily.   cetirizine 10 MG tablet Commonly known as: ZYRTEC Take 10 mg by mouth every morning.   docusate sodium 100 MG capsule Commonly known as: COLACE Take 1 capsule (100 mg total) by mouth 2 (two) times daily.   finasteride 5 MG tablet Commonly known as: PROSCAR TAKE 1 TABLET DAILY What changed:  how to take this when to take this   GLUCOSAMINE-CHONDROITIN PO Take 1 tablet by mouth every morning.   hydrochlorothiazide 25 MG tablet Commonly known as: HYDRODIURIL Take 1 tablet (25 mg total) by mouth daily. Annual appt due in May must see provider for future refills What changed:  when to take this   HYDROcodone-acetaminophen 5-325 MG tablet Commonly known as: NORCO/VICODIN Take 1-2 tablets by mouth every 6 (six) hours as needed for severe pain.   lisinopril 40 MG tablet Commonly known as: ZESTRIL TAKE 1 TABLET DAILY What changed: when to take this   methocarbamol 500 MG tablet Commonly known as: ROBAXIN Take 1 tablet (500 mg total) by mouth every 6 (six) hours as needed for muscle spasms.   omeprazole 20 MG capsule Commonly known as: PRILOSEC Take 20 mg by mouth daily.   polyethylene glycol 17 g packet Commonly known as: MIRALAX / GLYCOLAX Take 17 g by mouth daily as  needed for mild constipation.   VITAMIN C PO Take 1 tablet by mouth every morning.   VITAMIN D PO Take 1 tablet by mouth every morning.   ZINC PO Take 1 tablet by mouth every morning.               Discharge Care Instructions  (From admission, onward)           Start     Ordered   01/20/22 0000  Change dressing       Comments: Maintain surgical dressing until follow up in the clinic. If the edges start to pull up, may reinforce with tape. If the dressing is no longer working, may remove and cover with gauze and tape, but must keep the area dry and clean.  Call with any questions or concerns.   01/20/22 0713            Diagnostic Studies: DG Pelvis Portable  Result Date: 01/19/2022 CLINICAL DATA:  Right hip replacement EXAM: PORTABLE PELVIS 1-2 VIEWS COMPARISON:  None. FINDINGS: Interval right total hip arthroplasty. Normal alignment. No fracture or dislocation. Prior left hip arthroplasty. Soft tissue postsurgical changes overlying the right hip. IMPRESSION: Interval right total hip arthroplasty. Electronically Signed   By: Kathreen Devoid M.D.   On: 01/19/2022 11:03   DG C-Arm 1-60 Min-No Report  Result Date: 01/19/2022 Fluoroscopy was utilized by the requesting physician.  No radiographic interpretation.   DG HIP OPERATIVE UNILAT W OR W/O PELVIS RIGHT  Result Date: 01/19/2022 CLINICAL DATA:  Intraoperative right anterior hip replacement EXAM: OPERATIVE right HIP (WITH PELVIS IF PERFORMED) 2 VIEWS TECHNIQUE: Fluoroscopic spot image(s) were submitted for interpretation post-operatively. COMPARISON:  Radiograph 09/30/2020. FINDINGS: Intraoperative images during right total hip arthroplasty. Normal alignment without evidence of complication. IMPRESSION: Intraoperative images during right total hip arthroplasty. Normal alignment without evidence of complication. Electronically Signed   By: Maurine Simmering M.D.   On: 01/19/2022 10:13    Disposition: Discharge disposition:  01-Home or Self Care       Discharge Instructions     Call MD / Call 911   Complete by: As directed    If you experience chest pain or shortness of breath, CALL 911 and be transported to the hospital emergency room.  If you develope a fever above 101 F, pus (white drainage) or increased drainage or redness at the wound, or calf pain, call your surgeon's office.   Change dressing   Complete by: As directed    Maintain surgical dressing until follow up in the clinic. If the edges start to pull up, may reinforce with tape. If the dressing is no longer working, may remove and cover with gauze and tape, but must keep the area dry and clean.  Call with any questions or concerns.   Constipation Prevention   Complete by: As directed  Drink plenty of fluids.  Prune juice may be helpful.  You may use a stool softener, such as Colace (over the counter) 100 mg twice a day.  Use MiraLax (over the counter) for constipation as needed.   Diet - low sodium heart healthy   Complete by: As directed    Increase activity slowly as tolerated   Complete by: As directed    Weight bearing as tolerated with assist device (walker, cane, etc) as directed, use it as long as suggested by your surgeon or therapist, typically at least 4-6 weeks.   Post-operative opioid taper instructions:   Complete by: As directed    POST-OPERATIVE OPIOID TAPER INSTRUCTIONS: It is important to wean off of your opioid medication as soon as possible. If you do not need pain medication after your surgery it is ok to stop day one. Opioids include: Codeine, Hydrocodone(Norco, Vicodin), Oxycodone(Percocet, oxycontin) and hydromorphone amongst others.  Long term and even short term use of opiods can cause: Increased pain response Dependence Constipation Depression Respiratory depression And more.  Withdrawal symptoms can include Flu like symptoms Nausea, vomiting And more Techniques to manage these symptoms Hydrate well Eat  regular healthy meals Stay active Use relaxation techniques(deep breathing, meditating, yoga) Do Not substitute Alcohol to help with tapering If you have been on opioids for less than two weeks and do not have pain than it is ok to stop all together.  Plan to wean off of opioids This plan should start within one week post op of your joint replacement. Maintain the same interval or time between taking each dose and first decrease the dose.  Cut the total daily intake of opioids by one tablet each day Next start to increase the time between doses. The last dose that should be eliminated is the evening dose.      TED hose   Complete by: As directed    Use stockings (TED hose) for 2 weeks on both leg(s).  You may remove them at night for sleeping.        Follow-up Information     Paralee Cancel, MD. Schedule an appointment as soon as possible for a visit in 2 week(s).   Specialty: Orthopedic Surgery Contact information: [address] [place] [address] [phone]                  Signed: Irving Copas 01/28/2022, 12:52 PM

## 2022-05-03 ENCOUNTER — Encounter: Payer: Self-pay | Admitting: Registered Nurse

## 2022-05-03 ENCOUNTER — Other Ambulatory Visit: Payer: Self-pay

## 2022-05-03 ENCOUNTER — Ambulatory Visit (INDEPENDENT_AMBULATORY_CARE_PROVIDER_SITE_OTHER): Payer: Medicare Other | Admitting: Registered Nurse

## 2022-05-03 VITALS — BP 135/87 | HR 81 | Temp 98.2°F | Resp 18 | Ht 66.0 in | Wt 169.8 lb

## 2022-05-03 DIAGNOSIS — R7303 Prediabetes: Secondary | ICD-10-CM

## 2022-05-03 DIAGNOSIS — Z0000 Encounter for general adult medical examination without abnormal findings: Secondary | ICD-10-CM | POA: Diagnosis not present

## 2022-05-03 DIAGNOSIS — I10 Essential (primary) hypertension: Secondary | ICD-10-CM | POA: Diagnosis not present

## 2022-05-03 DIAGNOSIS — R7989 Other specified abnormal findings of blood chemistry: Secondary | ICD-10-CM

## 2022-05-03 DIAGNOSIS — E782 Mixed hyperlipidemia: Secondary | ICD-10-CM

## 2022-05-03 DIAGNOSIS — R350 Frequency of micturition: Secondary | ICD-10-CM

## 2022-05-03 DIAGNOSIS — N401 Enlarged prostate with lower urinary tract symptoms: Secondary | ICD-10-CM

## 2022-05-03 LAB — CBC WITH DIFFERENTIAL/PLATELET
Basophils Absolute: 0 10*3/uL (ref 0.0–0.1)
Basophils Relative: 0.5 % (ref 0.0–3.0)
Eosinophils Absolute: 0.2 10*3/uL (ref 0.0–0.7)
Eosinophils Relative: 2.2 % (ref 0.0–5.0)
HCT: 44.1 % (ref 39.0–52.0)
Hemoglobin: 14.9 g/dL (ref 13.0–17.0)
Lymphocytes Relative: 27.8 % (ref 12.0–46.0)
Lymphs Abs: 1.9 10*3/uL (ref 0.7–4.0)
MCHC: 33.8 g/dL (ref 30.0–36.0)
MCV: 87.3 fl (ref 78.0–100.0)
Monocytes Absolute: 0.5 10*3/uL (ref 0.1–1.0)
Monocytes Relative: 7.4 % (ref 3.0–12.0)
Neutro Abs: 4.3 10*3/uL (ref 1.4–7.7)
Neutrophils Relative %: 62.1 % (ref 43.0–77.0)
Platelets: 230 10*3/uL (ref 150.0–400.0)
RBC: 5.05 Mil/uL (ref 4.22–5.81)
RDW: 14.1 % (ref 11.5–15.5)
WBC: 7 10*3/uL (ref 4.0–10.5)

## 2022-05-03 LAB — HEMOGLOBIN A1C: Hgb A1c MFr Bld: 6.8 % — ABNORMAL HIGH (ref 4.6–6.5)

## 2022-05-03 MED ORDER — SIMVASTATIN 20 MG PO TABS: 20.0000 mg | ORAL_TABLET | Freq: Every day | ORAL | 3 refills | Status: DC

## 2022-05-03 MED ORDER — HYDROCHLOROTHIAZIDE 25 MG PO TABS: 25.0000 mg | ORAL_TABLET | Freq: Every day | ORAL | 3 refills | Status: DC

## 2022-05-03 MED ORDER — LISINOPRIL 40 MG PO TABS: 40.0000 mg | ORAL_TABLET | Freq: Every day | ORAL | 3 refills | Status: DC

## 2022-05-03 NOTE — Assessment & Plan Note (Signed)
Labs collected. Will follow up with the patient as warranted.  

## 2022-05-03 NOTE — Assessment & Plan Note (Signed)
Continue to follow with [facility].  ?

## 2022-05-03 NOTE — Progress Notes (Signed)
? ?Complete physical exam ? ?Patient: Christian Dodson   DOB: [birth year]   76 y.o. Male  MRN: [id number] ?Visit Date: 05/03/2022 ? ?Subjective:  ?  ?Chief Complaint  ?Patient presents with  ? Transitions Of Care  ?  Patient states he is here for a TOC and CPE.  ? ? ?Christian Dodson is a 76 y.o. male who presents today for a complete physical exam. He reports consuming a general diet.     He generally feels well. He reports sleeping well. He does not have additional problems to discuss today.  ? ?Vision:Within the last year ?Dental:Within Last 6 months ?STD Screen:No ?PSA:Yes ? ?Hypertension: ?Patient Currently taking: lisinopril '40mg'$  po qd, hctz '25mg'$  po qd ?Good effect. No AEs. ?Denies CV symptoms including: chest pain, shob, doe, headache, visual changes, fatigue, claudication, and dependent edema.  ? ?Previous readings and labs: ?BP Readings from Last 3 Encounters:  ?05/03/22 135/87  ?01/20/22 117/68  ?01/07/22 132/78  ? ?Lab Results  ?Component Value Date  ? CREATININE 0.95 01/20/2022  ? ?HLD ?Simvastatin '20mg'$  po qd ?Good effect. No AE.  ?Lab Results  ?Component Value Date  ? CHOL 155 04/27/2021  ? HDL 33.10 (L) 04/27/2021  ? LDLCALC 131 (H) 03/07/2014  ? LDLDIRECT 85.0 04/27/2021  ? TRIG 277.0 (H) 04/27/2021  ? CHOLHDL 5 04/27/2021  ? ? ? ?Most recent fall risk assessment: ? ?  05/03/2022  ?  7:55 AM  ?Fall Risk   ?Falls in the past year? 0  ?Number falls in past yr: 0  ?Injury with Fall? 0  ?Risk for fall due to : No Fall Risks  ?Follow up Falls evaluation completed  ? ?  ?Most recent depression screenings: ? ?  05/03/2022  ?  7:55 AM 05/13/2021  ? 11:23 AM  ?PHQ 2/9 Scores  ?PHQ - 2 Score 0 0  ?PHQ- 9 Score 0   ? ? ? ?Patient Active Problem List  ? Diagnosis Date Noted  ? S/P total right hip arthroplasty 01/19/2022  ? Choledocholithiasis   ? Elevated LFTs   ? Dilated cbd, acquired 12/19/2021  ? Pre-diabetes 09/19/2020  ? Routine general medical examination at a health care facility 03/26/2015  ? HTN (hypertension)  10/19/2013  ? GERD (gastroesophageal reflux disease) 10/19/2013  ? BPH (benign prostatic hyperplasia) 10/19/2013  ? ?Past Medical History:  ?Diagnosis Date  ? Arthritis   ? BPH (benign prostatic hyperplasia) 10/19/2013  ? GERD (gastroesophageal reflux disease)   ? History of stomach ulcers   ? age 42  ? Hypertension   ? Pneumonia   ? x4  ? Pre-diabetes   ? Skin cancer   ? ?Past Surgical History:  ?Procedure Laterality Date  ? APPENDECTOMY    ? CHOLECYSTECTOMY N/A 12/21/2021  ? Procedure: LAPAROSCOPIC CHOLECYSTECTOMY;  Surgeon: Christian Bolt, MD;  Location: [facility];  Service: General;  Laterality: N/A;  ? COLONOSCOPY  2010  ? [place]=normal exam per pt  ? ERCP N/A 12/20/2021  ? Procedure: ENDOSCOPIC RETROGRADE CHOLANGIOPANCREATOGRAPHY (ERCP);  Surgeon: Christian Artist, MD;  Location: [facility];  Service: Endoscopy;  Laterality: N/A;  ? INGUINAL HERNIA REPAIR Right   ? left shoulder rotator and bicep  01/10/2017  ? MEDIAL PARTIAL KNEE REPLACEMENT Right 2013  ? REMOVAL OF STONES  12/20/2021  ? Procedure: REMOVAL OF STONES;  Surgeon: Christian Artist, MD;  Location: [facility];  Service: Endoscopy;;  ? SPHINCTEROTOMY  12/20/2021  ? Procedure: SPHINCTEROTOMY;  Surgeon: Christian Artist, MD;  Location: [facility];  Service: Endoscopy;;  ? TONSILLECTOMY AND ADENOIDECTOMY    ? TOTAL HIP ARTHROPLASTY Left 09/30/2020  ? Procedure: TOTAL HIP ARTHROPLASTY ANTERIOR APPROACH;  Surgeon: Christian Cancel, MD;  Location: [facility];  Service: Orthopedics;  Laterality: Left;  70 mins  ? TOTAL HIP ARTHROPLASTY Right 01/19/2022  ? Procedure: TOTAL HIP ARTHROPLASTY ANTERIOR APPROACH;  Surgeon: Christian Cancel, MD;  Location: [facility];  Service: Orthopedics;  Laterality: Right;  ? UPPER GI ENDOSCOPY    ? ?Social History  ? ?Tobacco Use  ? Smoking status: Never  ? Smokeless tobacco: Never  ?Vaping Use  ? Vaping Use: Never used  ?Substance Use Topics  ? Alcohol use: Not Currently  ? Drug use: No  ? ?Social History  ? ?Socioeconomic History  ? Marital  status: Married  ?  Spouse name: Not on file  ? Number of children: 2  ? Years of education: Not on file  ? Highest education level: Not on file  ?Occupational History  ? Occupation: retired  ?Tobacco Use  ? Smoking status: Never  ? Smokeless tobacco: Never  ?Vaping Use  ? Vaping Use: Never used  ?Substance and Sexual Activity  ? Alcohol use: Not Currently  ? Drug use: No  ? Sexual activity: Yes  ?Other Topics Concern  ? Not on file  ?Social History Narrative  ? Not on file  ? ?Social Determinants of Health  ? ?Financial Resource Strain: Low Risk   ? Difficulty of Paying Living Expenses: Not hard at all  ?Food Insecurity: No Food Insecurity  ? Worried About Charity fundraiser in the Last Year: Never true  ? Ran Out of Food in the Last Year: Never true  ?Transportation Needs: No Transportation Needs  ? Lack of Transportation (Medical): No  ? Lack of Transportation (Non-Medical): No  ?Physical Activity: Sufficiently Active  ? Days of Exercise per Week: 5 days  ? Minutes of Exercise per Session: 30 min  ?Stress: No Stress Concern Present  ? Feeling of Stress : Not at all  ?Social Connections: Socially Integrated  ? Frequency of Communication with Friends and Family: More than three times a week  ? Frequency of Social Gatherings with Friends and Family: More than three times a week  ? Attends Religious Services: More than 4 times per year  ? Active Member of Clubs or Organizations: Yes  ? Attends Archivist Meetings: More than 4 times per year  ? Marital Status: Married  ?Intimate Partner Violence: Not on file  ? ?Family Status  ?Relation Name Status  ? Mother  (Not Specified)  ? Father  (Not Specified)  ? MGF  (Not Specified)  ? PGM  (Not Specified)  ? PGF  (Not Specified)  ? Neg Hx  (Not Specified)  ? ?Family History  ?Problem Relation Age of Onset  ? Colon polyps Mother   ? Hypertension Mother   ? COPD Mother   ? Cancer Father   ?     lung - smoker  ? Hyperlipidemia Maternal Grandfather   ? Heart disease  Maternal Grandfather   ? Stroke Paternal Grandmother   ? Heart disease Paternal Grandmother   ? Hyperlipidemia Paternal Grandfather   ? Diabetes Neg Hx   ? Colon cancer Neg Hx   ? Esophageal cancer Neg Hx   ? Rectal cancer Neg Hx   ? Stomach cancer Neg Hx   ? ?No Known Allergies ?  ?Patient Care Team: ?Maximiano Coss, NP as PCP -  General (Adult Health Nurse Practitioner) ?Ardis Hughs, MD as Attending Physician (Urology) ?Jola Schmidt, MD as Consulting Physician (Ophthalmology)  ? ?Medications: ?Outpatient Medications Prior to Visit  ?Medication Sig  ? Ascorbic Acid (VITAMIN C PO) Take 1 tablet by mouth every morning.  ? cetirizine (ZYRTEC) 10 MG tablet Take 10 mg by mouth every morning.  ? finasteride (PROSCAR) 5 MG tablet TAKE 1 TABLET DAILY (Patient taking differently: 5 mg at bedtime.)  ? GLUCOSAMINE-CHONDROITIN PO Take 1 tablet by mouth every morning.  ? Multiple Vitamins-Minerals (ZINC PO) Take 1 tablet by mouth every morning.  ? omeprazole (PRILOSEC) 20 MG capsule Take 20 mg by mouth daily.  ? VITAMIN D PO Take 1 tablet by mouth every morning.  ? [DISCONTINUED] hydrochlorothiazide (HYDRODIURIL) 25 MG tablet Take 1 tablet (25 mg total) by mouth daily. Annual appt due in May must see provider for future refills (Patient taking differently: Take 25 mg by mouth every morning. Annual appt due in May must see provider for future refills)  ? [DISCONTINUED] lisinopril (ZESTRIL) 40 MG tablet TAKE 1 TABLET DAILY (Patient taking differently: Take 40 mg by mouth every morning.)  ? [DISCONTINUED] simvastatin (ZOCOR) 20 MG tablet Take 1 tablet (20 mg total) by mouth at bedtime.  ? [DISCONTINUED] celecoxib (CELEBREX) 200 MG capsule Take 1 capsule (200 mg total) by mouth 2 (two) times daily.  ? [DISCONTINUED] docusate sodium (COLACE) 100 MG capsule Take 1 capsule (100 mg total) by mouth 2 (two) times daily.  ? [DISCONTINUED] HYDROcodone-acetaminophen (NORCO/VICODIN) 5-325 MG tablet Take 1-2 tablets by mouth every 6  (six) hours as needed for severe pain.  ? [DISCONTINUED] methocarbamol (ROBAXIN) 500 MG tablet Take 1 tablet (500 mg total) by mouth every 6 (six) hours as needed for muscle spasms.  ? [DISCONTINUED] po

## 2022-05-03 NOTE — Patient Instructions (Addendum)
Mr. Catterton -  ? ?Great to meet you ? ?Call if you need anything ? ?I'll let you know how labs look ? ?Thanks, ? ?Rich  ? ? ? ?If you have lab work done today you will be contacted with your lab results within the next 2 weeks.  If you have not heard from [place] then please contact us. The fastest way to get your results is to register for My Chart. ? ? ?IF you received an x-ray today, you will receive an invoice from [facility]. Please contact [facility] at [phone] with questions or concerns regarding your invoice.  ? ?IF you received labwork today, you will receive an invoice from [facility]. Please contact [facility] at [phone] with questions or concerns regarding your invoice.  ? ?Our billing staff will not be able to assist you with questions regarding bills from these companies. ? ?You will be contacted with the lab results as soon as they are available. The fastest way to get your results is to activate your My Chart account. Instructions are located on the last page of this paperwork. If you have not heard from [place] regarding the results in 2 weeks, please contact this office. ?  ? ? ?

## 2022-05-03 NOTE — Assessment & Plan Note (Signed)
Stable on current regimen. Continue. Follow up annually. ?

## 2022-05-04 LAB — LIPID PANEL
Cholesterol: 114 mg/dL (ref 0–200)
HDL: 37 mg/dL — ABNORMAL LOW (ref >39.00)
LDL Cholesterol: 49 mg/dL (ref 0–99)
NonHDL: 77.19
Total CHOL/HDL Ratio: 3
Triglycerides: 141 mg/dL (ref 0.0–149.0)
VLDL: 28.2 mg/dL (ref 0.0–40.0)

## 2022-05-04 LAB — COMPREHENSIVE METABOLIC PANEL
ALT: 19 U/L (ref 0–53)
AST: 13 U/L (ref 0–37)
Albumin: 4.6 g/dL (ref 3.5–5.2)
Alkaline Phosphatase: 80 U/L (ref 39–117)
BUN: 16 mg/dL (ref 6–23)
CO2: 25 mEq/L (ref 19–32)
Calcium: 10.4 mg/dL (ref 8.4–10.5)
Chloride: 102 mEq/L (ref 96–112)
Creatinine, Ser: 0.96 mg/dL (ref 0.40–1.50)
GFR: 78.25 mL/min (ref >60.00)
Glucose, Bld: 153 mg/dL — ABNORMAL HIGH (ref 70–99)
Potassium: 3.8 mEq/L (ref 3.5–5.1)
Sodium: 139 mEq/L (ref 135–145)
Total Bilirubin: 0.5 mg/dL (ref 0.2–1.2)
Total Protein: 7.5 g/dL (ref 6.0–8.3)

## 2022-06-10 ENCOUNTER — Ambulatory Visit: Payer: Medicare HMO | Admitting: Registered Nurse

## 2022-06-11 ENCOUNTER — Ambulatory Visit: Payer: Medicare HMO | Admitting: Registered Nurse

## 2022-10-25 LAB — HM DIABETES EYE EXAM

## 2022-11-02 LAB — HM DIABETES EYE EXAM

## 2022-12-01 LAB — HM DIABETES EYE EXAM

## 2023-01-06 ENCOUNTER — Encounter: Payer: Self-pay | Admitting: Internal Medicine

## 2023-01-06 ENCOUNTER — Ambulatory Visit (INDEPENDENT_AMBULATORY_CARE_PROVIDER_SITE_OTHER): Payer: Medicare Other | Admitting: Internal Medicine

## 2023-01-06 VITALS — BP 125/71 | HR 82 | Temp 98.1°F | Ht 66.0 in | Wt 174.2 lb

## 2023-01-06 DIAGNOSIS — I10 Essential (primary) hypertension: Secondary | ICD-10-CM

## 2023-01-06 DIAGNOSIS — N401 Enlarged prostate with lower urinary tract symptoms: Secondary | ICD-10-CM | POA: Diagnosis not present

## 2023-01-06 DIAGNOSIS — E782 Mixed hyperlipidemia: Secondary | ICD-10-CM

## 2023-01-06 DIAGNOSIS — Z0000 Encounter for general adult medical examination without abnormal findings: Secondary | ICD-10-CM

## 2023-01-06 DIAGNOSIS — K219 Gastro-esophageal reflux disease without esophagitis: Secondary | ICD-10-CM | POA: Diagnosis not present

## 2023-01-06 DIAGNOSIS — Z136 Encounter for screening for cardiovascular disorders: Secondary | ICD-10-CM

## 2023-01-06 DIAGNOSIS — Z6825 Body mass index (BMI) 25.0-25.9, adult: Secondary | ICD-10-CM

## 2023-01-06 DIAGNOSIS — Z9049 Acquired absence of other specified parts of digestive tract: Secondary | ICD-10-CM

## 2023-01-06 DIAGNOSIS — Z23 Encounter for immunization: Secondary | ICD-10-CM

## 2023-01-06 DIAGNOSIS — E119 Type 2 diabetes mellitus without complications: Secondary | ICD-10-CM | POA: Diagnosis not present

## 2023-01-06 DIAGNOSIS — J302 Other seasonal allergic rhinitis: Secondary | ICD-10-CM | POA: Insufficient documentation

## 2023-01-06 DIAGNOSIS — R350 Frequency of micturition: Secondary | ICD-10-CM

## 2023-01-06 DIAGNOSIS — K915 Postcholecystectomy syndrome: Secondary | ICD-10-CM

## 2023-01-06 HISTORY — DX: Acquired absence of other specified parts of digestive tract: Z90.49

## 2023-01-06 HISTORY — DX: Other seasonal allergic rhinitis: J30.2

## 2023-01-06 LAB — HEMOGLOBIN A1C: Hgb A1c MFr Bld: 7 % — ABNORMAL HIGH (ref 4.6–6.5)

## 2023-01-06 LAB — COMPREHENSIVE METABOLIC PANEL
ALT: 26 U/L (ref 0–53)
AST: 17 U/L (ref 0–37)
Albumin: 4.6 g/dL (ref 3.5–5.2)
Alkaline Phosphatase: 71 U/L (ref 39–117)
BUN: 13 mg/dL (ref 6–23)
CO2: 26 mEq/L (ref 19–32)
Calcium: 10 mg/dL (ref 8.4–10.5)
Chloride: 100 mEq/L (ref 96–112)
Creatinine, Ser: 1 mg/dL (ref 0.40–1.50)
GFR: 74.15 mL/min (ref >60.00)
Glucose, Bld: 152 mg/dL — ABNORMAL HIGH (ref 70–99)
Potassium: 3.9 mEq/L (ref 3.5–5.1)
Sodium: 137 mEq/L (ref 135–145)
Total Bilirubin: 0.6 mg/dL (ref 0.2–1.2)
Total Protein: 7.3 g/dL (ref 6.0–8.3)

## 2023-01-06 LAB — LIPID PANEL
Cholesterol: 114 mg/dL (ref 0–200)
HDL: 39.3 mg/dL (ref >39.00)
LDL Cholesterol: 41 mg/dL (ref 0–99)
NonHDL: 74.21
Total CHOL/HDL Ratio: 3
Triglycerides: 166 mg/dL — ABNORMAL HIGH (ref 0.0–149.0)
VLDL: 33.2 mg/dL (ref 0.0–40.0)

## 2023-01-06 LAB — CBC
HCT: 43.2 % (ref 39.0–52.0)
Hemoglobin: 14.8 g/dL (ref 13.0–17.0)
MCHC: 34.4 g/dL (ref 30.0–36.0)
MCV: 89.3 fl (ref 78.0–100.0)
Platelets: 243 10*3/uL (ref 150.0–400.0)
RBC: 4.83 Mil/uL (ref 4.22–5.81)
RDW: 14 % (ref 11.5–15.5)
WBC: 7.4 10*3/uL (ref 4.0–10.5)

## 2023-01-06 LAB — MICROALBUMIN / CREATININE URINE RATIO
Creatinine,U: 87.5 mg/dL
Microalb Creat Ratio: 0.8 mg/g (ref 0.0–30.0)
Microalb, Ur: <0.7 mg/dL (ref 0.0–1.9)

## 2023-01-06 LAB — TSH: TSH: 1.67 u[IU]/mL (ref 0.35–5.50)

## 2023-01-06 MED ORDER — SIMVASTATIN 20 MG PO TABS: 20.0000 mg | ORAL_TABLET | Freq: Every day | ORAL | 3 refills | Status: DC

## 2023-01-06 MED ORDER — HYDROCHLOROTHIAZIDE 25 MG PO TABS: 25.0000 mg | ORAL_TABLET | Freq: Every day | ORAL | 3 refills | Status: DC

## 2023-01-06 MED ORDER — FINASTERIDE 5 MG PO TABS: 5.0000 mg | ORAL_TABLET | Freq: Every day | ORAL | 3 refills | Status: AC

## 2023-01-06 MED ORDER — CHOLESTYRAMINE 4 G PO PACK: 4.0000 g | PACK | Freq: Two times a day (BID) | ORAL | 3 refills | Status: DC

## 2023-01-06 MED ORDER — LISINOPRIL 40 MG PO TABS: 40.0000 mg | ORAL_TABLET | Freq: Every day | ORAL | 3 refills | Status: DC

## 2023-01-06 NOTE — Assessment & Plan Note (Addendum)
Stable controlled with omeprazole 20 daily Encouraged try taper omeprazole to pepcid complete   Due to long history stomach indigestion/sensitivity and childhood peptic ulcer disease encouraged h.pylori testing

## 2023-01-06 NOTE — Assessment & Plan Note (Signed)
Individualized Hypertension Management: Stable,  controlled today in clinic at  BP Readings from Last 1 Encounters:  01/06/23 125/71    Goal blood pressure of less than 140/90 explained Resistant/secondary hypertension workup: not necessary in my opinion Current hypertension medications:       Sig   hydrochlorothiazide (HYDRODIURIL) 25 MG tablet (Taking) Take 1 tablet (25 mg total) by mouth daily.   lisinopril (ZESTRIL) 40 MG tablet (Taking) Take 1 tablet (40 mg total) by mouth daily.       Adjust medications as follows:  Continue unchanged  Standardized hypertension counseling and management: Counseled him to limit: salt, alcohol, NSAIDS, excess body weight. He only has a beer every month or two Have explained risks of poor control are FUTURE stroke and heart attacks Encouragement for home blood pressure monitoring Explained Red Flag symptoms for ER: if blood pressure over 180 AND new headache, shortness of breath, confusion, or chest discomfort See AFTER VISIT SUMMARY for addition educational information provided Offered to refill meds.

## 2023-01-06 NOTE — Progress Notes (Signed)
[place]  Phone: [phone]  New patient visit  Visit Date: 01/06/2023 Patient: Christian Dodson   DOB: Jun 24, 1946   77 y.o. Male  MRN: [id number]  Today's healthcare provider: Loralee Pacas, MD  Assessment and Plan:   Moss was seen today for transfer of care, gassy when eating greasy foods, cough (tickle in throat) and irregular bm.  Post-cholecystectomy syndrome -     Cholestyramine; Take 1 packet (4 g total) by mouth 2 (two) times daily.  Dispense: 180 each; Refill: 3  Preventative health care -     Lipid panel -     Comprehensive metabolic panel -     CBC  Benign prostatic hyperplasia with urinary frequency Overview: Lab Results  Component Value Date   PSA 1.68 04/27/2021   PSA 1.62 04/24/2020   PSA 1.47 04/26/2019   Takes finasteride Following with [facility] Self-Discontinued Flomax History of 2 biopsy in [place], for PSA up to 10 at one point, but improved with "microwave procedure" and finasteride   Orders: -     Finasteride; Take 1 tablet (5 mg total) by mouth at bedtime.  Dispense: 90 tablet; Refill: 3  Primary hypertension Assessment & Plan: Individualized Hypertension Management: Stable,  controlled today in clinic at  BP Readings from Last 1 Encounters:  01/06/23 125/71    Goal blood pressure of less than 140/90 explained Resistant/secondary hypertension workup: not necessary in my opinion Current hypertension medications:       Sig   hydrochlorothiazide (HYDRODIURIL) 25 MG tablet (Taking) Take 1 tablet (25 mg total) by mouth daily.   lisinopril (ZESTRIL) 40 MG tablet (Taking) Take 1 tablet (40 mg total) by mouth daily.       Adjust medications as follows:  Continue unchanged  Standardized hypertension counseling and management: Counseled him to limit: salt, alcohol, NSAIDS, excess body weight. He only has a beer every month or two Have explained risks of poor control are FUTURE stroke and heart  attacks Encouragement for home blood pressure monitoring Explained Red Flag symptoms for ER: if blood pressure over 180 AND new headache, shortness of breath, confusion, or chest discomfort See AFTER VISIT SUMMARY for addition educational information provided Offered to refill meds.   Orders: -     TSH -     hydroCHLOROthiazide; Take 1 tablet (25 mg total) by mouth daily.  Dispense: 90 tablet; Refill: 3 -     Lisinopril; Take 1 tablet (40 mg total) by mouth daily.  Dispense: 90 tablet; Refill: 3  Gastroesophageal reflux disease, unspecified whether esophagitis present Overview: History of peptic ulcer disease as child  Assessment & Plan: Stable controlled with omeprazole 20 daily Encouraged try taper omeprazole to pepcid complete   Due to long history stomach indigestion/sensitivity and childhood peptic ulcer disease encouraged h.pylori testing  Orders: -     H. pylori antigen, stool; Future  Controlled type 2 diabetes mellitus without complication, without long-term current use of insulin (HCC) Overview: Lab Results  Component Value Date   HGBA1C 6.8 (H) 05/03/2022   HGBA1C 6.2 (A) 12/09/2021   HGBA1C 7.1 (H) 04/27/2021      Assessment & Plan: He reports 20 lb weight loss without medication just diet and exercise Encouraged continue diet and exercise and check a1c  Orders: -     Hemoglobin A1c -     TSH -     Microalbumin / creatinine urine ratio  History of cholecystectomy Overview: 12/21/2021, for gallstones/pain/dilated cbd   Seasonal  allergies Assessment & Plan: Suspect this explains tickle in throat Encouraged SimplySaline daily Advised using Flonase/nasacort after SimplySaline instead of zyrtec. Counseled on sensimist he uses nasacort   BMI 25.0-25.9,adult -     TSH  Mixed hyperlipidemia -     Simvastatin; Take 1 tablet (20 mg total) by mouth at bedtime.  Dispense: 90 tablet; Refill: 3  Need for Tdap vaccination -     Tdap vaccine greater than or  equal to 7yo IM        Subjective:  Patient presents today to establish care.  Chief Complaint  Patient presents with   Transfer of care   Gassy when eating greasy foods   Cough (tickle in throat)   Irregular bm    Had gall bladder surgery.    Problem-oriented charting was used to develop and update his medical history: Problem  History of Cholecystectomy   12/21/2021, for gallstones/pain/dilated cbd   Post-Cholecystectomy Syndrome  Seasonal Allergies  Controlled Type 2 Diabetes Mellitus (Hcc)   Lab Results  Component Value Date   HGBA1C 6.8 (H) 05/03/2022   HGBA1C 6.2 (A) 12/09/2021   HGBA1C 7.1 (H) 04/27/2021       Htn (Hypertension)  Gerd (Gastroesophageal Reflux Disease)   History of peptic ulcer disease as child   Bph (Benign Prostatic Hyperplasia)   Lab Results  Component Value Date   PSA 1.68 04/27/2021   PSA 1.62 04/24/2020   PSA 1.47 04/26/2019   Takes finasteride [facility] Self-Discontinued Flomax History of 2 biopsy in [place], for PSA up to 10 at one point, but improved with "microwave procedure" and finasteride    Choledocholithiasis (Resolved)  Elevated Lfts (Resolved)  Dilated Cbd, Acquired (Resolved)  Routine General Medical Examination At [place] (Resolved)     Depression Screen    01/06/2023    8:10 AM 05/03/2022    7:55 AM 05/13/2021   11:23 AM 04/27/2021    8:53 AM  PHQ 2/9 Scores  PHQ - 2 Score 0 0 0 0  PHQ- 9 Score  0     Results for orders placed or performed in visit on 01/06/23  Lipid panel  Result Value Ref Range   Cholesterol 114 0 - 200 mg/dL   Triglycerides 166.0 (H) 0.0 - 149.0 mg/dL   HDL 39.30 >39.00 mg/dL   VLDL 33.2 0.0 - 40.0 mg/dL   LDL Cholesterol 41 0 - 99 mg/dL   Total CHOL/HDL Ratio 3    NonHDL 74.21   Comp Met (CMET)  Result Value Ref Range   Sodium 137 135 - 145 mEq/L   Potassium 3.9 3.5 - 5.1 mEq/L   Chloride 100 96 - 112 mEq/L   CO2 26 19 - 32 mEq/L   Glucose,  Bld 152 (H) 70 - 99 mg/dL   BUN 13 6 - 23 mg/dL   Creatinine, Ser 1.00 0.40 - 1.50 mg/dL   Total Bilirubin 0.6 0.2 - 1.2 mg/dL   Alkaline Phosphatase 71 39 - 117 U/L   AST 17 0 - 37 U/L   ALT 26 0 - 53 U/L   Total Protein 7.3 6.0 - 8.3 g/dL   Albumin 4.6 3.5 - 5.2 g/dL   GFR 74.15 >60.00 mL/min   Calcium 10.0 8.4 - 10.5 mg/dL  CBC  Result Value Ref Range   WBC 7.4 4.0 - 10.5 K/uL   RBC 4.83 4.22 - 5.81 Mil/uL   Platelets 243.0 150.0 - 400.0 K/uL   Hemoglobin 14.8 13.0 -  17.0 g/dL   HCT 43.2 39.0 - 52.0 %   MCV 89.3 78.0 - 100.0 fl   MCHC 34.4 30.0 - 36.0 g/dL   RDW 14.0 11.5 - 15.5 %  HgB A1c  Result Value Ref Range   Hgb A1c MFr Bld 7.0 (H) 4.6 - 6.5 %  TSH  Result Value Ref Range   TSH 1.67 0.35 - 5.50 uIU/mL  Microalbumin / creatinine urine ratio  Result Value Ref Range   Microalb, Ur <0.7 0.0 - 1.9 mg/dL   Creatinine,U 87.5 mg/dL   Microalb Creat Ratio 0.8 0.0 - 30.0 mg/g    The following were reviewed and entered/updated into his MEDICAL RECORD NUMBERPast Medical History:  Diagnosis Date   Arthritis    BPH (benign prostatic hyperplasia) 10/19/2013   GERD (gastroesophageal reflux disease)    History of stomach ulcers    age 89   Hypertension    Pneumonia    x4   Pre-diabetes    Seasonal allergies 01/06/2023   Skin cancer    Past Surgical History:  Procedure Laterality Date   APPENDECTOMY     CHOLECYSTECTOMY N/A 12/21/2021   Procedure: LAPAROSCOPIC CHOLECYSTECTOMY;  Surgeon: Dwan Bolt, MD;  Location: [facility];  Service: General;  Laterality: N/A;   COLONOSCOPY  2010   [place]=normal exam per pt   ERCP N/A 12/20/2021   Procedure: ENDOSCOPIC RETROGRADE CHOLANGIOPANCREATOGRAPHY (ERCP);  Surgeon: Ladene Artist, MD;  Location: [facility];  Service: Endoscopy;  Laterality: N/A;   INGUINAL HERNIA REPAIR Right    left shoulder rotator and bicep  01/10/2017   MEDIAL PARTIAL KNEE REPLACEMENT Right 2013   REMOVAL OF STONES  12/20/2021   Procedure: REMOVAL OF  STONES;  Surgeon: Ladene Artist, MD;  Location: [facility];  Service: Endoscopy;;   SPHINCTEROTOMY  12/20/2021   Procedure: Joan Mayans;  Surgeon: Ladene Artist, MD;  Location: [facility];  Service: Endoscopy;;   TONSILLECTOMY AND ADENOIDECTOMY     TOTAL HIP ARTHROPLASTY Left 09/30/2020   Procedure: TOTAL HIP ARTHROPLASTY ANTERIOR APPROACH;  Surgeon: Paralee Cancel, MD;  Location: [facility];  Service: Orthopedics;  Laterality: Left;  70 mins   TOTAL HIP ARTHROPLASTY Right 01/19/2022   Procedure: TOTAL HIP ARTHROPLASTY ANTERIOR APPROACH;  Surgeon: Paralee Cancel, MD;  Location: [facility];  Service: Orthopedics;  Laterality: Right;   UPPER GI ENDOSCOPY     Family History  Problem Relation Age of Onset   Colon polyps Mother    Hypertension Mother    COPD Mother    Cancer Father        lung - smoker   Hyperlipidemia Maternal Grandfather    Heart disease Maternal Grandfather    Stroke Paternal Grandmother    Heart disease Paternal Grandmother    Hyperlipidemia Paternal Grandfather    Diabetes Neg Hx    Colon cancer Neg Hx    Esophageal cancer Neg Hx    Rectal cancer Neg Hx    Stomach cancer Neg Hx    Outpatient Medications Prior to Visit  Medication Sig Dispense Refill   Ascorbic Acid (VITAMIN C PO) Take 1 tablet by mouth every morning.     cetirizine (ZYRTEC) 10 MG tablet Take 10 mg by mouth every morning.     GLUCOSAMINE-CHONDROITIN PO Take 1 tablet by mouth every morning.     Multiple Vitamins-Minerals (ZINC PO) Take 1 tablet by mouth every morning.     omeprazole (PRILOSEC) 20 MG capsule Take 20 mg by mouth daily.  VITAMIN D PO Take 1 tablet by mouth every morning.     finasteride (PROSCAR) 5 MG tablet TAKE 1 TABLET DAILY (Patient taking differently: 5 mg at bedtime.) 90 tablet 2   hydrochlorothiazide (HYDRODIURIL) 25 MG tablet Take 1 tablet (25 mg total) by mouth daily. 90 tablet 3   lisinopril (ZESTRIL) 40 MG tablet Take 1 tablet (40 mg total) by mouth daily. 90 tablet 3    simvastatin (ZOCOR) 20 MG tablet Take 1 tablet (20 mg total) by mouth at bedtime. 90 tablet 3   No facility-administered medications prior to visit.    No Known Allergies Social History   Tobacco Use   Smoking status: Never   Smokeless tobacco: Never  Vaping Use   Vaping Use: Never used  Substance Use Topics   Alcohol use: Not Currently   Drug use: No    Immunization History  Administered Date(s) Administered   DT (Pediatric) 12/21/2011   Fluad Quad(high Dose 65+) 09/19/2020, 09/08/2021   Influenza, High Dose Seasonal PF 09/19/2013, 10/10/2016, 10/02/2018, 09/20/2019, 10/01/2022   Influenza,inj,Quad PF,6+ Mos 09/03/2014   Influenza-Unspecified 08/21/2015, 10/03/2017, 09/20/2019   PFIZER(Purple Top)SARS-COV-2 Vaccination 01/24/2020, 02/14/2020, 11/12/2020   Pneumococcal Conjugate-13 03/24/2016   Pneumococcal Polysaccharide-23 03/20/2013   Tdap 10/03/2012, 01/06/2023   Zoster Recombinat (Shingrix) 10/31/2018, 01/30/2019   Zoster, Live 09/03/2014    Objective:  BP 125/71 (BP Location: Left Arm, Patient Position: Sitting)   Pulse 82   Temp 98.1 F (36.7 C) (Temporal)   Ht '5\' 6"'$  (1.676 m)   Wt 174 lb 3.2 oz (79 kg)   SpO2 97%   BMI 28.12 kg/m  Body mass index is 28.12 kg/m. indicates this is an  Overweight male , but waist circumference is a better indicator of healthy body composition. He does have truncal adiposity Physical Exam  Vital signs reviewed.  Nursing notes reviewed. General Appearance/Constitutional:  polite male in no acute distress Musculoskeletal: All extremities are intact.  Neurological:  Awake, alert,  No obvious focal neurological deficits or cognitive impairments Psychiatric:  Appropriate mood, pleasant demeanor Problem-specific findings:  very intelligent, knowledgable about his medical care.    Results Reviewed: Results for orders placed or performed in visit on 01/06/23  Lipid panel  Result Value Ref Range   Cholesterol 114 0 - 200 mg/dL    Triglycerides 166.0 (H) 0.0 - 149.0 mg/dL   HDL 39.30 >39.00 mg/dL   VLDL 33.2 0.0 - 40.0 mg/dL   LDL Cholesterol 41 0 - 99 mg/dL   Total CHOL/HDL Ratio 3    NonHDL 74.21   Comp Met (CMET)  Result Value Ref Range   Sodium 137 135 - 145 mEq/L   Potassium 3.9 3.5 - 5.1 mEq/L   Chloride 100 96 - 112 mEq/L   CO2 26 19 - 32 mEq/L   Glucose, Bld 152 (H) 70 - 99 mg/dL   BUN 13 6 - 23 mg/dL   Creatinine, Ser 1.00 0.40 - 1.50 mg/dL   Total Bilirubin 0.6 0.2 - 1.2 mg/dL   Alkaline Phosphatase 71 39 - 117 U/L   AST 17 0 - 37 U/L   ALT 26 0 - 53 U/L   Total Protein 7.3 6.0 - 8.3 g/dL   Albumin 4.6 3.5 - 5.2 g/dL   GFR 74.15 >60.00 mL/min   Calcium 10.0 8.4 - 10.5 mg/dL  CBC  Result Value Ref Range   WBC 7.4 4.0 - 10.5 K/uL   RBC 4.83 4.22 - 5.81 Mil/uL   Platelets 243.0 150.0 -  400.0 K/uL   Hemoglobin 14.8 13.0 - 17.0 g/dL   HCT 43.2 39.0 - 52.0 %   MCV 89.3 78.0 - 100.0 fl   MCHC 34.4 30.0 - 36.0 g/dL   RDW 14.0 11.5 - 15.5 %  HgB A1c  Result Value Ref Range   Hgb A1c MFr Bld 7.0 (H) 4.6 - 6.5 %  TSH  Result Value Ref Range   TSH 1.67 0.35 - 5.50 uIU/mL  Microalbumin / creatinine urine ratio  Result Value Ref Range   Microalb, Ur <0.7 0.0 - 1.9 mg/dL   Creatinine,U 87.5 mg/dL   Microalb Creat Ratio 0.8 0.0 - 30.0 mg/g

## 2023-01-06 NOTE — Assessment & Plan Note (Signed)
He reports 20 lb weight loss without medication just diet and exercise Encouraged continue diet and exercise and check a1c

## 2023-01-06 NOTE — Assessment & Plan Note (Addendum)
Suspect this explains tickle in throat Encouraged SimplySaline daily Advised using Flonase/nasacort after SimplySaline instead of zyrtec. Counseled on sensimist he uses nasacort

## 2023-01-08 LAB — H. PYLORI ANTIGEN, STOOL: H pylori Ag, Stl: NEGATIVE

## 2023-01-24 ENCOUNTER — Telehealth: Payer: Self-pay | Admitting: Internal Medicine

## 2023-01-24 NOTE — Telephone Encounter (Signed)
Patient states: - Just received cholestyramine 4 g in the mail  - Upon taking it it made him sick and caused vomiting   States he is fine now but doesn't want to throw up after taking it each time. Requests an alternative. Please Advise.

## 2023-01-25 ENCOUNTER — Encounter: Payer: Self-pay | Admitting: Internal Medicine

## 2023-01-25 NOTE — Telephone Encounter (Signed)
Please call pt back with details about med that makes him vomit. And wants to know why he has to take Questran if he is already taking a cholesterol med. Please advise.

## 2023-01-26 NOTE — Telephone Encounter (Signed)
Message was sent to pt via MyChart

## 2023-01-26 NOTE — Telephone Encounter (Signed)
Please see message below

## 2023-01-27 ENCOUNTER — Encounter: Payer: Self-pay | Admitting: Internal Medicine

## 2023-02-16 ENCOUNTER — Telehealth: Payer: Self-pay | Admitting: Internal Medicine

## 2023-02-16 NOTE — Telephone Encounter (Signed)
Contacted Christian Dodson to schedule their annual wellness visit. Appointment made for 02/22/2023.  [place] Direct Dial [phone]

## 2023-02-22 ENCOUNTER — Ambulatory Visit (INDEPENDENT_AMBULATORY_CARE_PROVIDER_SITE_OTHER): Payer: Medicare Other

## 2023-02-22 VITALS — BP 124/70 | HR 76 | Temp 98.7°F | Wt 172.6 lb

## 2023-02-22 DIAGNOSIS — Z0000 Encounter for general adult medical examination without abnormal findings: Secondary | ICD-10-CM

## 2023-02-22 NOTE — Patient Instructions (Signed)
[name] , Thank you for taking time to come for your Medicare Wellness Visit. I appreciate your ongoing commitment to your health goals. Please review the following plan we discussed and let me know if I can assist you in the future.   These are the goals we discussed:  Goals      Be as healthy and active as long as possible     Continue to exercise and eat healthy     Patient Stated     I want to lose weight by cutting back on the amount of sugar and snacks I eat. Continue to exercise. Enjoy life, family and play golf.     Weight < 180 lb (81.647 kg)     Will start walking again; 5 days as tolerated;          This is a list of the screening recommended for you and due dates:  Health Maintenance  Topic Date Due   Pneumonia Vaccine (3 of 3 - PPSV23 or PCV20) 07/07/2023*   Complete foot exam   07/07/2023*   Eye exam for diabetics  07/07/2023*   COVID-19 Vaccine (4 - 2023-24 season) 07/07/2023*   Hemoglobin A1C  07/07/2023   Yearly kidney function blood test for diabetes  01/07/2024   Yearly kidney health urinalysis for diabetes  01/07/2024   Medicare Annual Wellness Visit  02/22/2024   Colon Cancer Screening  07/15/2024   DTaP/Tdap/Td vaccine (4 - Td or Tdap) 01/06/2033   Flu Shot  Completed   Hepatitis C Screening: USPSTF Recommendation to screen - Ages 18-79 yo.  Completed   Zoster (Shingles) Vaccine  Completed   HPV Vaccine  Aged Out  *Topic was postponed. The date shown is not the original due date.    Advanced directives: copies in chart   Conditions/risks identified: lose weight and stay active   Next appointment: Follow up in one year for your annual wellness visit.   Preventive Care 8 Years and Older, Male  Preventive care refers to lifestyle choices and visits with your health care provider that can promote health and wellness. What does preventive care include? A yearly physical exam. This is also called an annual well check. Dental exams once or twice a  year. Routine eye exams. Ask your health care provider how often you should have your eyes checked. Personal lifestyle choices, including: Daily care of your teeth and gums. Regular physical activity. Eating a healthy diet. Avoiding tobacco and drug use. Limiting alcohol use. Practicing safe sex. Taking low doses of aspirin every day. Taking vitamin and mineral supplements as recommended by your health care provider. What happens during an annual well check? The services and screenings done by your health care provider during your annual well check will depend on your age, overall health, lifestyle risk factors, and family history of disease. Counseling  Your health care provider may ask you questions about your: Alcohol use. Tobacco use. Drug use. Emotional well-being. Home and relationship well-being. Sexual activity. Eating habits. History of falls. Memory and ability to understand (cognition). Work and work Statistician. Screening  You may have the following tests or measurements: Height, weight, and BMI. Blood pressure. Lipid and cholesterol levels. These may be checked every 5 years, or more frequently if you are over 60 years old. Skin check. Lung cancer screening. You may have this screening every year starting at age 53 if you have a 30-pack-year history of smoking and currently smoke or have quit within the past 15 years.  Fecal occult blood test (FOBT) of the stool. You may have this test every year starting at age 20. Flexible sigmoidoscopy or colonoscopy. You may have a sigmoidoscopy every 5 years or a colonoscopy every 10 years starting at age 66. Prostate cancer screening. Recommendations will vary depending on your family history and other risks. Hepatitis C blood test. Hepatitis B blood test. Sexually transmitted disease (STD) testing. Diabetes screening. This is done by checking your blood sugar (glucose) after you have not eaten for a while (fasting). You may  have this done every 1-3 years. Abdominal aortic aneurysm (AAA) screening. You may need this if you are a current or former smoker. Osteoporosis. You may be screened starting at age 4 if you are at high risk. Talk with your health care provider about your test results, treatment options, and if necessary, the need for more tests. Vaccines  Your health care provider may recommend certain vaccines, such as: Influenza vaccine. This is recommended every year. Tetanus, diphtheria, and acellular pertussis (Tdap, Td) vaccine. You may need a Td booster every 10 years. Zoster vaccine. You may need this after age 72. Pneumococcal 13-valent conjugate (PCV13) vaccine. One dose is recommended after age 25. Pneumococcal polysaccharide (PPSV23) vaccine. One dose is recommended after age 46. Talk to your health care provider about which screenings and vaccines you need and how often you need them. This information is not intended to replace advice given to you by your health care provider. Make sure you discuss any questions you have with your health care provider. Document Released: 01/02/2016 Document Revised: 08/25/2016 Document Reviewed: 10/07/2015 Elsevier Interactive Patient Education  2017 [place] Prevention in the Home Falls can cause injuries. They can happen to people of all ages. There are many things you can do to make your home safe and to help prevent falls. What can I do on the outside of my home? Regularly fix the edges of walkways and driveways and fix any cracks. Remove anything that might make you trip as you walk through a door, such as a raised step or threshold. Trim any bushes or trees on the path to your home. Use bright outdoor lighting. Clear any walking paths of anything that might make someone trip, such as rocks or tools. Regularly check to see if handrails are loose or broken. Make sure that both sides of any steps have handrails. Any raised decks and porches  should have guardrails on the edges. Have any leaves, snow, or ice cleared regularly. Use sand or salt on walking paths during winter. Clean up any spills in your garage right away. This includes oil or grease spills. What can I do in the bathroom? Use night lights. Install grab bars by the toilet and in the tub and shower. Do not use towel bars as grab bars. Use non-skid mats or decals in the tub or shower. If you need to sit down in the shower, use a plastic, non-slip stool. Keep the floor dry. Clean up any water that spills on the floor as soon as it happens. Remove soap buildup in the tub or shower regularly. Attach bath mats securely with double-sided non-slip rug tape. Do not have throw rugs and other things on the floor that can make you trip. What can I do in the bedroom? Use night lights. Make sure that you have a light by your bed that is easy to reach. Do not use any sheets or blankets that are too big for your bed. They  should not hang down onto the floor. Have a firm chair that has side arms. You can use this for support while you get dressed. Do not have throw rugs and other things on the floor that can make you trip. What can I do in the kitchen? Clean up any spills right away. Avoid walking on wet floors. Keep items that you use a lot in easy-to-reach places. If you need to reach something above you, use a strong step stool that has a grab bar. Keep electrical cords out of the way. Do not use floor polish or wax that makes floors slippery. If you must use wax, use non-skid floor wax. Do not have throw rugs and other things on the floor that can make you trip. What can I do with my stairs? Do not leave any items on the stairs. Make sure that there are handrails on both sides of the stairs and use them. Fix handrails that are broken or loose. Make sure that handrails are as long as the stairways. Check any carpeting to make sure that it is firmly attached to the stairs.  Fix any carpet that is loose or worn. Avoid having throw rugs at the top or bottom of the stairs. If you do have throw rugs, attach them to the floor with carpet tape. Make sure that you have a light switch at the top of the stairs and the bottom of the stairs. If you do not have them, ask someone to add them for you. What else can I do to help prevent falls? Wear shoes that: Do not have high heels. Have rubber bottoms. Are comfortable and fit you well. Are closed at the toe. Do not wear sandals. If you use a stepladder: Make sure that it is fully opened. Do not climb a closed stepladder. Make sure that both sides of the stepladder are locked into place. Ask someone to hold it for you, if possible. Clearly mark and make sure that you can see: Any grab bars or handrails. First and last steps. Where the edge of each step is. Use tools that help you move around (mobility aids) if they are needed. These include: Canes. Walkers. Scooters. Crutches. Turn on the lights when you go into a dark area. Replace any light bulbs as soon as they burn out. Set up your furniture so you have a clear path. Avoid moving your furniture around. If any of your floors are uneven, fix them. If there are any pets around you, be aware of where they are. Review your medicines with your doctor. Some medicines can make you feel dizzy. This can increase your chance of falling. Ask your doctor what other things that you can do to help prevent falls. This information is not intended to replace advice given to you by your health care provider. Make sure you discuss any questions you have with your health care provider. Document Released: 10/02/2009 Document Revised: 05/13/2016 Document Reviewed: 01/10/2015 Elsevier Interactive Patient Education  2017 [employer].

## 2023-02-22 NOTE — Progress Notes (Signed)
Subjective:   Christian Dodson is a 78 y.o. male who presents for Medicare Annual/Subsequent preventive examination.   Patient Medicare AWV questionnaire was completed by the patient on 02/21/23; I have confirmed that all information answered by patient is correct and no changes since this date.         Review of Systems     Cardiac Risk Factors include: advanced age (>46mn, >>54women);diabetes mellitus;male gender;hypertension;dyslipidemia     Objective:    Today's Vitals   02/22/23 0920  BP: 124/70  Pulse: 76  Temp: 98.7 F (37.1 C)  SpO2: 94%  Weight: 172 lb 9.6 oz (78.3 kg)   Body mass index is 27.86 kg/m.     02/22/2023    9:26 AM 01/19/2022    4:30 PM 01/07/2022   10:04 AM 12/19/2021    8:35 AM 05/13/2021   11:24 AM 09/30/2020    3:40 PM 09/18/2020    8:55 AM  Advanced Directives  Does Patient Have a Medical Advance Directive? Yes Yes Yes No Yes Yes Yes  Type of AParamedicof AFifth StreetLiving will HT[place]Living will [place]Living will   H[place]Living will HWest [place]Living will  Does patient want to make changes to medical advance directive? No - Patient declined No - Patient declined   No - Patient declined No - Patient declined No - Patient declined  Copy of HCanyon Cityin Chart? Yes - validated most recent copy scanned in chart (See row information) No - copy requested No - copy requested   Yes - validated most recent copy scanned in chart (See row information) Yes - validated most recent copy scanned in chart (See row information)  Would patient like information on creating a medical advance directive?  No - Patient declined  No - Patient declined       Current Medications (verified) Outpatient Encounter Medications as of 02/22/2023  Medication Sig   Ascorbic Acid (VITAMIN C PO) Take 1 tablet by mouth every morning.   cetirizine (ZYRTEC) 10  MG tablet Take 10 mg by mouth every morning.   finasteride (PROSCAR) 5 MG tablet Take 1 tablet (5 mg total) by mouth at bedtime.   GLUCOSAMINE-CHONDROITIN PO Take 1 tablet by mouth every morning.   hydrochlorothiazide (HYDRODIURIL) 25 MG tablet Take 1 tablet (25 mg total) by mouth daily.   lisinopril (ZESTRIL) 40 MG tablet Take 1 tablet (40 mg total) by mouth daily.   Multiple Vitamins-Minerals (ZINC PO) Take 1 tablet by mouth every morning.   omeprazole (PRILOSEC) 20 MG capsule Take 20 mg by mouth daily.   simvastatin (ZOCOR) 20 MG tablet Take 1 tablet (20 mg total) by mouth at bedtime.   VITAMIN D PO Take 1 tablet by mouth every morning.   [DISCONTINUED] cholestyramine (QUESTRAN) 4 g packet Take 1 packet (4 g total) by mouth 2 (two) times daily.   No facility-administered encounter medications on file as of 02/22/2023.    Allergies (verified) Patient has no known allergies.   History: Past Medical History:  Diagnosis Date   Arthritis    BPH (benign prostatic hyperplasia) 10/19/2013   GERD (gastroesophageal reflux disease)    History of stomach ulcers    age 77  Hypertension    Pneumonia    x4   Pre-diabetes    Seasonal allergies 01/06/2023   Skin cancer    Past Surgical History:  Procedure Laterality Date  APPENDECTOMY     CHOLECYSTECTOMY N/A 12/21/2021   Procedure: LAPAROSCOPIC CHOLECYSTECTOMY;  Surgeon: Dwan Bolt, MD;  Location: [facility];  Service: General;  Laterality: N/A;   COLONOSCOPY  2010   [place]=normal exam per pt   ERCP N/A 12/20/2021   Procedure: ENDOSCOPIC RETROGRADE CHOLANGIOPANCREATOGRAPHY (ERCP);  Surgeon: Ladene Artist, MD;  Location: [facility];  Service: Endoscopy;  Laterality: N/A;   INGUINAL HERNIA REPAIR Right    left shoulder rotator and bicep  01/10/2017   MEDIAL PARTIAL KNEE REPLACEMENT Right 2013   REMOVAL OF STONES  12/20/2021   Procedure: REMOVAL OF STONES;  Surgeon: Ladene Artist, MD;  Location: [facility];  Service: Endoscopy;;    SPHINCTEROTOMY  12/20/2021   Procedure: Joan Mayans;  Surgeon: Ladene Artist, MD;  Location: [facility];  Service: Endoscopy;;   TONSILLECTOMY AND ADENOIDECTOMY     TOTAL HIP ARTHROPLASTY Left 09/30/2020   Procedure: TOTAL HIP ARTHROPLASTY ANTERIOR APPROACH;  Surgeon: Paralee Cancel, MD;  Location: [facility];  Service: Orthopedics;  Laterality: Left;  70 mins   TOTAL HIP ARTHROPLASTY Right 01/19/2022   Procedure: TOTAL HIP ARTHROPLASTY ANTERIOR APPROACH;  Surgeon: Paralee Cancel, MD;  Location: [facility];  Service: Orthopedics;  Laterality: Right;   UPPER GI ENDOSCOPY     Family History  Problem Relation Age of Onset   Colon polyps Mother    Hypertension Mother    COPD Mother    Cancer Father        lung - smoker   Hyperlipidemia Maternal Grandfather    Heart disease Maternal Grandfather    Stroke Paternal Grandmother    Heart disease Paternal Grandmother    Hyperlipidemia Paternal Grandfather    Diabetes Neg Hx    Colon cancer Neg Hx    Esophageal cancer Neg Hx    Rectal cancer Neg Hx    Stomach cancer Neg Hx    Social History   Socioeconomic History   Marital status: Married    Spouse name: Not on file   Number of children: 2   Years of education: Not on file   Highest education level: Not on file  Occupational History   Occupation: retired  Tobacco Use   Smoking status: Never   Smokeless tobacco: Never  Vaping Use   Vaping Use: Never used  Substance and Sexual Activity   Alcohol use: Not Currently   Drug use: No   Sexual activity: Yes  Other Topics Concern   Not on file  Social History Narrative   Not on file   Social Determinants of Health   Financial Resource Strain: Low Risk  (02/21/2023)   Overall Financial Resource Strain (CARDIA)    Difficulty of Paying Living Expenses: Not hard at all  Food Insecurity: No Food Insecurity (02/21/2023)   Hunger Vital Sign    Worried About Running Out of Food in the Last Year: Never true    [place] in the Last Year:  Never true  Transportation Needs: No Transportation Needs (02/21/2023)   PRAPARE - Hydrologist (Medical): No    Lack of Transportation (Non-Medical): No  Physical Activity: Sufficiently Active (02/21/2023)   Exercise Vital Sign    Days of Exercise per Week: 5 days    Minutes of Exercise per Session: 120 min  Stress: No Stress Concern Present (02/21/2023)   [place]    Feeling of Stress : Not at all  Social Connections:  Moderately Isolated (02/21/2023)   Social Connection and Isolation Panel [NHANES]    Frequency of Communication with Friends and Family: Once a week    Frequency of Social Gatherings with Friends and Family: More than three times a week    Attends Religious Services: Never    Marine scientist or Organizations: No    Attends Music therapist: Patient refused    Marital Status: Married    Tobacco Counseling Counseling given: Not Answered   Clinical Intake:     Pain : No/denies pain     BMI - recorded: 27.86 Nutritional Status: BMI 25 -29 Overweight Nutritional Risks: None Diabetes: Yes CBG done?: No Did pt. bring in CBG monitor from home?: No  How often do you need to have someone help you when you read instructions, pamphlets, or other written materials from your doctor or pharmacy?: 1 - Never  Diabetic?Nutrition Risk Assessment:  Has the patient had any N/V/D within the last 2 months?  No  Does the patient have any non-healing wounds?  No  Has the patient had any unintentional weight loss or weight gain?  No   Diabetes:  Is the patient diabetic?  Yes  If diabetic, was a CBG obtained today?  No  Did the patient bring in their glucometer from home?  No  How often do you monitor your CBG's? N/a.   Financial Strains and Diabetes Management:  Are you having any financial strains with the device, your supplies or your medication? No .  Does  the patient want to be seen by Chronic Care Management for management of their diabetes?  No  Would the patient like to be referred to a Nutritionist or for Diabetic Management?  No   Diabetic Exams:  Diabetic Eye Exam: Completed 09/16/22 Diabetic Foot Exam: Overdue, Pt has been advised about the importance in completing this exam. Pt is scheduled for diabetic foot exam on next appt .   Interpreter Needed?: No  Information entered by :: Charlott Rakes, LPN   Activities of Daily Living    02/21/2023    2:56 PM  In your present state of health, do you have any difficulty performing the following activities:  Hearing? 1  Vision? 0  Difficulty concentrating or making decisions? 0  Walking or climbing stairs? 0  Dressing or bathing? 0  Doing errands, shopping? 0  Preparing Food and eating ? N  Using the Toilet? N  In the past six months, have you accidently leaked urine? N  Do you have problems with loss of bowel control? N  Managing your Medications? N  Managing your Finances? N  Housekeeping or managing your Housekeeping? N    Patient Care Team: Loralee Pacas, MD as PCP - General (Internal Medicine) Ardis Hughs, MD as Attending Physician (Urology) Jola Schmidt, MD as Consulting Physician (Ophthalmology)  Indicate any recent Medical Services you may have received from other than [facility] providers in the past year (date may be approximate).     Assessment:   This is a routine wellness examination for [facility].  Hearing/Vision screen Hearing Screening - Comments:: Pt wears a hearing aids  Vision Screening - Comments:: Pt follows up with Dr Valetta Close for annual eye exams   Dietary issues and exercise activities discussed: Current Exercise Habits: Home exercise routine, Type of exercise: walking;Other - see comments, Time (Minutes): > 60, Frequency (Times/Week): 5, Weekly Exercise (Minutes/Week): 0   Goals Addressed  This Visit's Progress     keep  trying tolose weight and stay active (pt-stated)         Depression Screen    02/22/2023    9:27 AM 01/06/2023    8:10 AM 05/03/2022    7:55 AM 05/13/2021   11:23 AM 04/27/2021    8:53 AM 04/24/2020    8:23 AM 04/24/2019    1:39 PM  PHQ 2/9 Scores  PHQ - 2 Score 0 0 0 0 0 0 0  PHQ- 9 Score   0        Fall Risk    02/21/2023    2:56 PM 01/06/2023    8:10 AM 05/03/2022    7:55 AM 05/13/2021   11:24 AM 04/27/2021    8:54 AM  Fall Risk   Falls in the past year? 0 0 0 0 0  Number falls in past yr: 0 0 0 0 0  Injury with Fall? 0 0 0 0 0  Risk for fall due to : Impaired vision No Fall Risks No Fall Risks No Fall Risks   Follow up Falls prevention discussed Falls evaluation completed Falls evaluation completed Falls evaluation completed     [place]:  Any stairs in or around the home? Yes  If so, are there any without handrails? No  Home free of loose throw rugs in walkways, pet beds, electrical cords, etc? Yes  Adequate lighting in your home to reduce risk of falls? Yes   ASSISTIVE DEVICES UTILIZED TO PREVENT FALLS:  Life alert? Yes apple watch  Use of a cane, walker or w/c? No  Grab bars in the bathroom? No  Shower chair or bench in shower? No  Elevated toilet seat or a handicapped toilet? No   TIMED UP AND GO:  Was the test performed? Yes .  Length of time to ambulate 10 feet: 10 sec.   Gait steady and fast without use of assistive device  Cognitive Function:    04/17/2018    8:22 AM  MMSE - Mini Mental State Exam  Not completed: Refused        02/22/2023    9:30 AM  6CIT Screen  What Year? 0 points  What month? 0 points  What time? 0 points  Count back from 20 0 points  Months in reverse 0 points  Repeat phrase 0 points  Total Score 0 points    Immunizations Immunization History  Administered Date(s) Administered   DT (Pediatric) 12/21/2011   Fluad Quad(high Dose 65+) 09/19/2020, 09/08/2021   Influenza, High Dose Seasonal PF  09/19/2013, 10/10/2016, 10/02/2018, 09/20/2019, 10/01/2022   Influenza,inj,Quad PF,6+ Mos 09/03/2014   Influenza-Unspecified 08/21/2015, 10/03/2017, 09/20/2019   PFIZER(Purple Top)SARS-COV-2 Vaccination 01/24/2020, 02/14/2020, 11/12/2020   Pneumococcal Conjugate-13 03/24/2016   Pneumococcal Polysaccharide-23 03/20/2013   Tdap 10/03/2012, 01/06/2023   Zoster Recombinat (Shingrix) 10/31/2018, 01/30/2019   Zoster, Live 09/03/2014    TDAP status: Up to date  Flu Vaccine status: Up to date  Pneumococcal vaccine status: Up to date  Covid-19 vaccine status: Completed vaccines  Qualifies for Shingles Vaccine? Yes   Zostavax completed Yes   Shingrix Completed?: Yes  Screening Tests Health Maintenance  Topic Date Due   Pneumonia Vaccine 87+ Years old (3 of 3 - PPSV23 or PCV20) 07/07/2023 (Originally 03/24/2021)   FOOT EXAM  07/07/2023 (Originally 07/11/1956)   OPHTHALMOLOGY EXAM  07/07/2023 (Originally 07/11/1956)   COVID-19 Vaccine (4 - 2023-24 season) 07/07/2023 (Originally 08/20/2022)   HEMOGLOBIN  A1C  07/07/2023   Diabetic kidney evaluation - eGFR measurement  01/07/2024   Diabetic kidney evaluation - Urine ACR  01/07/2024   Medicare Annual Wellness (AWV)  02/22/2024   COLONOSCOPY (Pts 45-51yr Insurance coverage will need to be confirmed)  07/15/2024   DTaP/Tdap/Td (4 - Td or Tdap) 01/06/2033   INFLUENZA VACCINE  Completed   Hepatitis C Screening  Completed   Zoster Vaccines- Shingrix  Completed   HPV VACCINES  Aged Out    Health Maintenance  There are no preventive care reminders to display for this patient.  Colorectal cancer screening: Type of screening: Colonoscopy. Completed 07/15/21. Repeat every 3 years   Additional Screening:  Hepatitis C Screening: Completed 04/14/17  Vision Screening: Recommended annual ophthalmology exams for early detection of glaucoma and other disorders of the eye. Is the patient up to date with their annual eye exam?  Yes  Who is the provider  or what is the name of the office in which the patient attends annual eye exams? Dr BValetta Close If pt is not established with a provider, would they like to be referred to a provider to establish care? No .   Dental Screening: Recommended annual dental exams for proper oral hygiene  Community Resource Referral / Chronic Care Management: CRR required this visit?  No   CCM required this visit?  No      Plan:     I have personally reviewed and noted the following in the patient's chart:   Medical and social history Use of alcohol, tobacco or illicit drugs  Current medications and supplements including opioid prescriptions. Patient is not currently taking opioid prescriptions. Functional ability and status Nutritional status Physical activity Advanced directives List of other physicians Hospitalizations, surgeries, and ER visits in previous 12 months Vitals Screenings to include cognitive, depression, and falls Referrals and appointments  In addition, I have reviewed and discussed with patient certain preventive protocols, quality metrics, and best practice recommendations. A written personalized care plan for preventive services as well as general preventive health recommendations were provided to patient.     TWillette Brace LPN   [id number]  Nurse Notes: none

## 2023-05-05 ENCOUNTER — Encounter: Payer: Medicare Other | Admitting: Registered Nurse

## 2023-07-07 ENCOUNTER — Encounter: Payer: Medicare Other | Admitting: Internal Medicine

## 2023-07-08 ENCOUNTER — Ambulatory Visit (INDEPENDENT_AMBULATORY_CARE_PROVIDER_SITE_OTHER): Payer: Medicare Other | Admitting: Internal Medicine

## 2023-07-08 ENCOUNTER — Encounter: Payer: Self-pay | Admitting: Internal Medicine

## 2023-07-08 VITALS — BP 104/62 | HR 96 | Temp 97.8°F | Ht 66.0 in | Wt 170.2 lb

## 2023-07-08 DIAGNOSIS — R350 Frequency of micturition: Secondary | ICD-10-CM

## 2023-07-08 DIAGNOSIS — I10 Essential (primary) hypertension: Secondary | ICD-10-CM | POA: Diagnosis not present

## 2023-07-08 DIAGNOSIS — B351 Tinea unguium: Secondary | ICD-10-CM | POA: Diagnosis not present

## 2023-07-08 DIAGNOSIS — E663 Overweight: Secondary | ICD-10-CM

## 2023-07-08 DIAGNOSIS — E785 Hyperlipidemia, unspecified: Secondary | ICD-10-CM | POA: Insufficient documentation

## 2023-07-08 DIAGNOSIS — N401 Enlarged prostate with lower urinary tract symptoms: Secondary | ICD-10-CM | POA: Diagnosis not present

## 2023-07-08 DIAGNOSIS — E119 Type 2 diabetes mellitus without complications: Secondary | ICD-10-CM | POA: Diagnosis not present

## 2023-07-08 DIAGNOSIS — M6530 Trigger finger, unspecified finger: Secondary | ICD-10-CM | POA: Insufficient documentation

## 2023-07-08 DIAGNOSIS — K219 Gastro-esophageal reflux disease without esophagitis: Secondary | ICD-10-CM

## 2023-07-08 DIAGNOSIS — M65321 Trigger finger, right index finger: Secondary | ICD-10-CM

## 2023-07-08 HISTORY — DX: Tinea unguium: B35.1

## 2023-07-08 MED ORDER — TERBINAFINE HCL 250 MG PO TABS
250.0000 mg | ORAL_TABLET | Freq: Every day | ORAL | 0 refills | Status: AC
Start: 2023-07-08 — End: 2023-09-30

## 2023-07-08 MED ORDER — CICLOPIROX OLAMINE 0.77 % EX CREA: TOPICAL_CREAM | Freq: Two times a day (BID) | CUTANEOUS | 1 refills | Status: DC

## 2023-07-08 NOTE — Progress Notes (Signed)
[facility]: [phone]   Routine Medical Office Visit  Patient:  Christian Dodson      Age: 77 y.o.       Sex:  male  Date:   07/08/2023 Patient Care Team: Lula Olszewski, MD as PCP - General (Internal Medicine) Crist Fat, MD as Attending Physician (Urology) Sinda Du, MD as Consulting Physician (Ophthalmology) Today's Healthcare Provider: Lula Olszewski, MD   Assessment and Plan:     Christian Dodson was seen today for follow-up, gastroesophageal reflux, medical management of chronic issues and diabetes.  Onychomycosis Overview: Onychomycosis: Has a history of toenail fungus. We will prescribe topical Terbinafine for 12 weeks and Cyclopirox.  Orders: -     Terbinafine HCl; Take 1 tablet (250 mg total) by mouth daily. For toenail fungal infection  Dispense: 84 tablet; Refill: 0 -     Ciclopirox Olamine; Apply topically 2 (two) times daily.  Dispense: 90 g; Refill: 1  Controlled type 2 diabetes mellitus without complication, without long-term current use of insulin [facility]) Overview: July 08, 2023 interim history: He reports that his perception is that his diabetes as been very well controlled since last visit  He reports home capillary blood glucose readings since last visit are in the range of NOT checking due to weight loss  He reports he has NOT had any significant lows or highs or side effect(s)  He reports compliance/adherence with his current medication(s) which include: None, diet only  Labs and Risk Scores: Lab Results  Component Value Date   HGBA1C 7.0 (H) 01/06/2023   HGBA1C 6.8 (H) 05/03/2022   HGBA1C 6.2 (A) 12/09/2021   HGBA1C 7.1 (H) 04/27/2021   HGBA1C 6.2 (H) 09/18/2020   Lab Results  Component Value Date   LDLCALC 41 01/06/2023   Lab Results  Component Value Date   MICROALBUR <0.7 01/06/2023   Diabetes Composite Score: 5  Values used to calculate this score:   Points  Metrics      1        Blood Pressure: 104/62       1        Prescribed Statins: N/A - patient does not meet statin therapy criteria      1        Hemoglobin A1c: 7.0%      1        Smokes Tobacco: No      1        Prescribed Aspirin: N/A - patient does not meet cardiovascular risk criteria   Health Maintenance: Diabetes Health Maintenance Due  Topic Date Due   HEMOGLOBIN A1C  07/07/2023   OPHTHALMOLOGY EXAM  12/02/2023   FOOT EXAM  07/07/2024   07/08/2023 Diabetic Foot Exam - Simple   Simple Foot Form Diabetic Foot exam was performed with the following findings: Yes 07/08/2023  8:30 AM  Visual Inspection No deformities, no ulcerations, no other skin breakdown bilaterally: Yes Sensation Testing Intact to touch and monofilament testing bilaterally: Yes Pulse Check Posterior Tibialis and Dorsalis pulse intact bilaterally: Yes Comments Slightly diminished pulses, hair on toes, good sensation, mild to moderate diffuse onychomycosis    Care Team Ophthalmologist:  Sinda Du, MD     Assessment & Plan: Well-controlled.  Diabetes Mellitus: Well controlled with lifestyle modifications and an A1c of 7 in January, without current glucose monitoring. We will order labs including A1c and cholesterol, consider medication adjustment based on lab results, continue lifestyle modifications including a low carb diet, and  refer to a nutritionist for further dietary guidance.  Orders: -     CBC with Differential/Platelet; Future -     Comprehensive metabolic panel; Future -     TSH; Future -     Lipid panel; Future -     Hemoglobin A1c; Future  Benign prostatic hyperplasia with urinary frequency Overview: Lab Results  Component Value Date   PSA 1.68 04/27/2021   PSA 1.62 04/24/2020   PSA 1.47 04/26/2019   Takes finasteride Self-Discontinued Flomax Following with [facility] History of 2 biopsy in [place], for PSA up to 10 at one point, but improved with "microwave procedure" and finasteride   Assessment &  Plan: Enlarged Prostate: Has a history of transurethral resection of the prostate (TURP) and radiofrequency ablation, currently on Finasteride. We will order a PSA test.  Orders: -     PSA; Future  Primary hypertension Overview: Interim history from July 08, 2023:    Home readings: occasionally usually good under 130.  Patient reports taking current medications consistently and not experiencing any significant associated side effects or symptoms. Current hypertension medications:       Sig   hydrochlorothiazide (HYDRODIURIL) 25 MG tablet (Taking) Take 1 tablet (25 mg total) by mouth daily.   lisinopril (ZESTRIL) 40 MG tablet (Taking) Take 1 tablet (40 mg total) by mouth daily.      Lab Results  Component Value Date   NA 137 01/06/2023   K 3.9 01/06/2023   CREATININE 1.00 01/06/2023   GFR 74.15 01/06/2023    Assessment & Plan: Hypertension: Well controlled on Hydrochlorothiazide 25mg  and Lisinopril 40mg , with home readings under 130/80. He should continue current medications and monitor blood pressure at home, especially after alcohol consumption.   Gastroesophageal reflux disease, unspecified whether esophagitis present Overview: History of peptic ulcer disease as child Long term omeprazole, attempts to taper with Pepcid complete.  Assessment & Plan: Gastroesophageal Reflux Disease (GERD): On Omeprazole, with a history of flare-ups when attempting to taper off medication. He is advised to continue a slow taper of Omeprazole with a backup plan for breakthrough heartburn.   Trigger index finger of right hand Overview: Orthopedic injection helped a lot 07/07/23 R index   Overweight Overview: Increased body mass noted.  Body mass index is 27.47 kg/m.  Well proportioned with no abnormal fat distribution.  Good muscle tone.  Lab Results  Component Value Date   TSH 1.67 01/06/2023   Lab Results  Component Value Date   HGBA1C 7.0 (H) 01/06/2023    Wt Readings from  Last 10 Encounters:  07/08/23 170 lb 3.2 oz (77.2 kg)  02/22/23 172 lb 9.6 oz (78.3 kg)  01/06/23 174 lb 3.2 oz (79 kg)  05/03/22 169 lb 12.8 oz (77 kg)  01/19/22 165 lb 6.4 oz (75 kg)  01/07/22 165 lb 6.4 oz (75 kg)  12/28/21 165 lb 12.8 oz (75.2 kg)  12/20/21 166 lb (75.3 kg)  12/09/21 171 lb (77.6 kg)  07/15/21 179 lb (81.2 kg)        General Health Maintenance: He should continue daily baby aspirin, check feet daily for signs of infection, and plan for a 3-month follow-up and lab recheck.   Recommended follow up: 6 months  Future Appointments  Date Time Provider Department Center  03/01/2024 10:15 AM [facility] ANNUAL WELLNESS VISIT 1 [facility]           Clinical Presentation:    77 y.o. male who has HTN (hypertension); GERD (gastroesophageal reflux disease);  BPH (benign prostatic hyperplasia); Controlled type 2 diabetes mellitus (HCC); S/P total right hip arthroplasty; Overweight; Post-cholecystectomy syndrome; Seasonal allergies; Trigger finger; Hyperlipidemia; and Onychomycosis on their problem list. His reasons/main concerns/chief complaints for today's office visit are Follow-up, Gastroesophageal Reflux, Medical Management of Chronic Issues, and Diabetes   AI-Extracted: Discussed the use of AI scribe software for clinical note transcription with the patient, who gave verbal consent to proceed.  History of Present Illness   The patient, with a history of diabetes and hypertension, presented for an annual physical. He reported a significant weight loss from 195 lbs to 170 lbs over the past two years, which he attributed to dietary changes and increased physical activity. Despite this, the patient's BMI remained at 27.8, indicating a need for further weight management. The patient's diabetes was reportedly well-controlled without the need for medication, although he had not been monitoring his blood glucose levels at home. His last recorded A1c in January was 7.  The patient  also reported a history of an enlarged prostate, for which he was taking finasteride. He had undergone a procedure 15 years ago involving radiofrequency ablation of the prostate. He was due for a prostate-specific antigen (PSA) test, as he had a history of elevated PSA levels, with a peak of 10 at one point.  In addition, the patient mentioned a recent visit to an orthopedic doctor for a trigger finger issue in his hand. He received a cortisone shot, which significantly improved his symptoms within 24 hours.  The patient also reported a history of heartburn, which was managed with omeprazole. He had attempted to taper off the medication but experienced painful flare-ups.  Lastly, the patient reported occasional spring allergies, which he managed with a nasal spray during the spring season. He also had a history of toenail fungus, which he had attempted to treat with tea tree oil and coconut oil. He was considering oral and topical antifungal treatments.        Reviewed chart data:  has a past medical history of Arthritis, BPH (benign prostatic hyperplasia) (10/19/2013), GERD (gastroesophageal reflux disease), History of cholecystectomy (01/06/2023), History of stomach ulcers, Hypertension, Pneumonia, Pre-diabetes, Seasonal allergies (01/06/2023), and Skin cancer. Outpatient Medications Prior to Visit  Medication Sig   Ascorbic Acid (VITAMIN C PO) Take 1 tablet by mouth every morning.   cetirizine (ZYRTEC) 10 MG tablet Take 10 mg by mouth every morning.   finasteride (PROSCAR) 5 MG tablet Take 1 tablet (5 mg total) by mouth at bedtime.   GLUCOSAMINE-CHONDROITIN PO Take 1 tablet by mouth every morning.   hydrochlorothiazide (HYDRODIURIL) 25 MG tablet Take 1 tablet (25 mg total) by mouth daily.   lisinopril (ZESTRIL) 40 MG tablet Take 1 tablet (40 mg total) by mouth daily.   Multiple Vitamins-Minerals (ZINC PO) Take 1 tablet by mouth every morning.   omeprazole (PRILOSEC) 20 MG capsule Take 20 mg  by mouth daily.   simvastatin (ZOCOR) 20 MG tablet Take 1 tablet (20 mg total) by mouth at bedtime.   VITAMIN D PO Take 1 tablet by mouth every morning.   No facility-administered medications prior to visit.         Clinical Data Analysis:   Physical Exam  BP 104/62 (BP Location: Right Arm, Patient Position: Sitting)   Pulse 96   Temp 97.8 F (36.6 C) (Temporal)   Ht 5\' 6"  (1.676 m)   Wt 170 lb 3.2 oz (77.2 kg)   SpO2 97%   BMI 27.47 kg/m  Wt Readings  from Last 10 Encounters:  07/08/23 170 lb 3.2 oz (77.2 kg)  02/22/23 172 lb 9.6 oz (78.3 kg)  01/06/23 174 lb 3.2 oz (79 kg)  05/03/22 169 lb 12.8 oz (77 kg)  01/19/22 165 lb 6.4 oz (75 kg)  01/07/22 165 lb 6.4 oz (75 kg)  12/28/21 165 lb 12.8 oz (75.2 kg)  12/20/21 166 lb (75.3 kg)  12/09/21 171 lb (77.6 kg)  07/15/21 179 lb (81.2 kg)   Vital signs reviewed.  Nursing notes reviewed. Weight trend reviewed. Abnormalities and Problem-Specific physical exam findings:  foot exam done today.onychomycosis found  General Appearance:  No acute distress appreciable.   Well-groomed, healthy-appearing male.  Well proportioned with no abnormal fat distribution.  Good muscle tone. Skin: Clear and well-hydrated. Pulmonary:  Normal work of breathing at rest, no respiratory distress apparent. SpO2: 97 %  Musculoskeletal: All extremities are intact.  Neurological:  Awake, alert, oriented, and engaged.  No obvious focal neurological deficits or cognitive impairments.  Sensorium seems unclouded.   Speech is clear and coherent with logical content. Psychiatric:  Appropriate mood, pleasant and cooperative demeanor, thoughtful and engaged during the exam  Results Reviewed:      No results found for any visits on 07/08/23.  Scanned Document on 06/01/2023  Component Date Value   HM Diabetic Eye Exam 12/01/2022     HM Diabetic Eye Exam 11/02/2022     HM Diabetic Eye Exam 10/25/2022    Office Visit on 01/06/2023  Component Date Value    Cholesterol 01/06/2023 114    Triglycerides 01/06/2023 166.0 (H)    HDL 01/06/2023 39.30    VLDL 01/06/2023 33.2    LDL Cholesterol 01/06/2023 41    Total CHOL/HDL Ratio 01/06/2023 3    NonHDL 01/06/2023 74.21    Sodium 01/06/2023 137    Potassium 01/06/2023 3.9    Chloride 01/06/2023 100    CO2 01/06/2023 26    Glucose, Bld 01/06/2023 152 (H)    BUN 01/06/2023 13    Creatinine, Ser 01/06/2023 1.00    Total Bilirubin 01/06/2023 0.6    Alkaline Phosphatase 01/06/2023 71    AST 01/06/2023 17    ALT 01/06/2023 26    Total Protein 01/06/2023 7.3    Albumin 01/06/2023 4.6    GFR 01/06/2023 74.15    Calcium 01/06/2023 10.0    WBC 01/06/2023 7.4    RBC 01/06/2023 4.83    Platelets 01/06/2023 243.0    Hemoglobin 01/06/2023 14.8    HCT 01/06/2023 43.2    MCV 01/06/2023 89.3    MCHC 01/06/2023 34.4    RDW 01/06/2023 14.0    Hgb A1c MFr Bld 01/06/2023 7.0 (H)    TSH 01/06/2023 1.67    Microalb, Ur 01/06/2023 <0.7    Creatinine,U 01/06/2023 87.5    Microalb Creat Ratio 01/06/2023 0.8    H pylori Ag, Stl 01/06/2023 Negative    No image results found.   No results found.     This encounter employed real-time, collaborative documentation. The patient actively reviewed and updated their medical record on a shared screen, ensuring transparency and facilitating joint problem-solving for the problem list, overview, and plan. This approach promotes accurate, informed care. The treatment plan was discussed and reviewed in detail, including medication safety, potential side effects, and all patient questions. We confirmed understanding and comfort with the plan. Follow-up instructions were established, including contacting the office for any concerns, returning if symptoms worsen, persist, or new symptoms develop, and precautions for potential emergency  department visits. ----------------------------------------------------- Lula Olszewski, MD  07/08/2023 6:37 PM  [facility]:  [phone]

## 2023-07-08 NOTE — Patient Instructions (Addendum)
It was a pleasure seeing you today! Your health and satisfaction are our top priorities.  Glenetta Hew, MD  Your Providers PCP: Lula Olszewski, MD,  [phone]) Referring Provider: Lula Olszewski, MD,  [phone]) Care Team Provider: Crist Fat, MD,  [phone]) Care Team Provider: Sinda Du, MD,  [phone])  VISIT SUMMARY:  During your annual physical, we discussed your ongoing health conditions including diabetes, hypertension, an enlarged prostate, heartburn, and toenail fungus. We also discussed your weight loss and the need for further weight management. Your diabetes and hypertension are well-controlled, and we will continue to monitor these conditions. We will also order some tests and consider medication adjustments based on the results.  YOUR PLAN:  -DIABETES: Your diabetes is well-controlled with lifestyle changes. We will order some tests, including a test to measure your average blood sugar levels over the past few months (A1c) and cholesterol. Depending on the results, we may need to adjust your medication. We will also refer you to a nutritionist for further dietary guidance.  -HYPERTENSION: Your high blood pressure is well-controlled with your current medications. Continue taking these and monitor your blood pressure at home, especially after drinking alcohol.  -ENLARGED PROSTATE: We will order a test (PSA) to monitor your prostate health.  -HEARTBURN: Continue to slowly reduce your heartburn medication (Omeprazole) as planned, but have a backup plan for any sudden flare-ups.  -TOENAIL FUNGUS: We will prescribe a topical medication (Terbinafine) for your toenail fungus.  -GENERAL HEALTH MAINTENANCE: Continue taking daily baby aspirin, check your feet daily for signs of infection, and plan for a follow-up visit and lab recheck in 6 months.  INSTRUCTIONS:  Please continue to monitor your blood pressure at home, especially after drinking  alcohol. Also, check your feet daily for signs of infection. We will order some tests, and depending on the results, we may need to adjust your medication. We will also refer you to a nutritionist for further dietary guidance. Continue to slowly reduce your heartburn medication as planned, but have a backup plan for any sudden flare-ups. We will prescribe a topical medication for your toenail fungus. Plan for a follow-up visit and lab recheck in 6 months.   NEXT STEPS: [x]  Early Intervention: Schedule sooner appointment, call our on-call services, or go to emergency room if there is any significant Increase in pain or discomfort New or worsening symptoms Sudden or severe changes in your health [x]  Flexible Follow-Up: We recommend a No follow-ups on file. for optimal routine care. This allows for progress monitoring and treatment adjustments. [x]  Preventive Care: Schedule your annual preventive care visit! It's typically covered by insurance and helps identify potential health issues early. [x]  Lab & X-ray Appointments: Incomplete tests scheduled today, or call to schedule. X-rays: [facility] (M-F, 8:30am-noon or 1pm-5pm). [x]  Medical Information Release: Sign a release form at front desk to obtain relevant medical information we don't have.  MAKING THE MOST OF OUR FOCUSED 20 MINUTE APPOINTMENTS: [x]   Clearly state your top concerns at the beginning of the visit to focus our discussion [x]   If you anticipate you will need more time, please inform the front desk during scheduling - we can book multiple appointments in the same week. [x]   If you have transportation problems- use our convenient video appointments or ask about transportation support. [x]   We can get down to business faster if you use MyChart to update information before the visit and submit non-urgent questions before your visit. Thank you for  taking the time to provide details through MyChart.  Let our nurse know and  she can import this information into your encounter documents.  Arrival and Wait Times: [x]   Arriving on time ensures that everyone receives prompt attention. [x]   Early morning (8a) and afternoon (1p) appointments tend to have shortest wait times. [x]   Unfortunately, we cannot delay appointments for late arrivals or hold slots during phone calls.  Getting Answers and Following Up [x]   Simple Questions & Concerns: For quick questions or basic follow-up after your visit, reach [place] at [phone] or MyChart messaging. [x]   Complex Concerns: If your concern is more complex, scheduling an appointment might be best. Discuss this with the staff to find the most suitable option. [x]   Lab & Imaging Results: We'll contact you directly if results are abnormal or you don't use MyChart. Most normal results will be on MyChart within 2-3 business days, with a review message from Dr. Jon Billings. Haven't heard back in 2 weeks? Need results sooner? Contact us at [phone]. [x]   Referrals: Our referral coordinator will manage specialist referrals. The specialist's office should contact you within 2 weeks to schedule an appointment. Call us if you haven't heard from them after 2 weeks.  Staying Connected [x]   MyChart: Activate your MyChart for the fastest way to access results and message [place]. See the last page of this paperwork for instructions on how to activate.  Bring to Your Next Appointment [x]   Medications: Please bring all your medication bottles to your next appointment to ensure we have an accurate record of your prescriptions. [x]   Health Diaries: If you're monitoring any health conditions at home, keeping a diary of your readings can be very helpful for discussions at your next appointment.  Billing [x]   X-ray & Lab Orders: These are billed by separate companies. Contact the invoicing company directly for questions or concerns. [x]   Visit Charges: Discuss any billing inquiries with our  administrative services team.  Your Satisfaction Matters [x]   Share Your Experience: We strive for your satisfaction! If you have any complaints, or preferably compliments, please let Dr. Jon Billings know directly or contact our Practice Administrators, Edwena Felty or [employer], by asking at the front desk.   Reviewing Your Records [x]   Review this early draft of your clinical encounter notes below and the final encounter summary tomorrow on MyChart after its been completed.  All orders placed so far are visible here: Onychomycosis -     Terbinafine HCl; Take 1 tablet (250 mg total) by mouth daily. For toenail fungal infection  Dispense: 84 tablet; Refill: 0 -     Ciclopirox Olamine; Apply topically 2 (two) times daily.  Dispense: 90 g; Refill: 1  Controlled type 2 diabetes mellitus without complication, without long-term current use of insulin (HCC) Assessment & Plan: Well-controlled.  Diabetes Mellitus: Well controlled with lifestyle modifications and an A1c of 7 in January, without current glucose monitoring. We will order labs including A1c and cholesterol, consider medication adjustment based on lab results, continue lifestyle modifications including a low carb diet, and refer to a nutritionist for further dietary guidance.  Orders: -     CBC with Differential/Platelet; Future -     Comprehensive metabolic panel; Future -     TSH; Future -     Lipid panel; Future -     Hemoglobin A1c; Future  Benign prostatic hyperplasia with urinary frequency Assessment & Plan: Enlarged Prostate: Has a history of transurethral resection of the prostate (  TURP) and radiofrequency ablation, currently on Finasteride. We will order a PSA test.  Orders: -     PSA; Future  Primary hypertension Assessment & Plan: Hypertension: Well controlled on Hydrochlorothiazide 25mg  and Lisinopril 40mg , with home readings under 130/80. He should continue current medications and monitor blood pressure at home,  especially after alcohol consumption.   Gastroesophageal reflux disease, unspecified whether esophagitis present Assessment & Plan: Gastroesophageal Reflux Disease (GERD): On Omeprazole, with a history of flare-ups when attempting to taper off medication. He is advised to continue a slow taper of Omeprazole with a backup plan for breakthrough heartburn.   Trigger index finger of right hand  Overweight

## 2023-07-08 NOTE — Assessment & Plan Note (Signed)
Enlarged Prostate: Has a history of transurethral resection of the prostate (TURP) and radiofrequency ablation, currently on Finasteride. We will order a PSA test.

## 2023-07-08 NOTE — Assessment & Plan Note (Addendum)
Well-controlled.  Diabetes Mellitus: Well controlled with lifestyle modifications and an A1c of 7 in January, without current glucose monitoring. We will order labs including A1c and cholesterol, consider medication adjustment based on lab results, continue lifestyle modifications including a low carb diet, and refer to a nutritionist for further dietary guidance.

## 2023-07-08 NOTE — Assessment & Plan Note (Signed)
Hypertension: Well controlled on Hydrochlorothiazide 25mg  and Lisinopril 40mg , with home readings under 130/80. He should continue current medications and monitor blood pressure at home, especially after alcohol consumption.

## 2023-07-08 NOTE — Assessment & Plan Note (Signed)
Gastroesophageal Reflux Disease (GERD): On Omeprazole, with a history of flare-ups when attempting to taper off medication. He is advised to continue a slow taper of Omeprazole with a backup plan for breakthrough heartburn.

## 2023-07-12 ENCOUNTER — Other Ambulatory Visit (INDEPENDENT_AMBULATORY_CARE_PROVIDER_SITE_OTHER): Payer: Medicare Other

## 2023-07-12 DIAGNOSIS — N401 Enlarged prostate with lower urinary tract symptoms: Secondary | ICD-10-CM | POA: Diagnosis not present

## 2023-07-12 DIAGNOSIS — E119 Type 2 diabetes mellitus without complications: Secondary | ICD-10-CM

## 2023-07-12 DIAGNOSIS — R350 Frequency of micturition: Secondary | ICD-10-CM | POA: Diagnosis not present

## 2023-07-12 LAB — COMPREHENSIVE METABOLIC PANEL
ALT: 19 U/L (ref 0–53)
AST: 11 U/L (ref 0–37)
Albumin: 4.5 g/dL (ref 3.5–5.2)
Alkaline Phosphatase: 71 U/L (ref 39–117)
BUN: 22 mg/dL (ref 6–23)
CO2: 27 mEq/L (ref 19–32)
Calcium: 10.3 mg/dL (ref 8.4–10.5)
Chloride: 100 mEq/L (ref 96–112)
Creatinine, Ser: 1.11 mg/dL (ref 0.40–1.50)
GFR: 65.19 mL/min (ref >60.00)
Glucose, Bld: 144 mg/dL — ABNORMAL HIGH (ref 70–99)
Potassium: 4.3 mEq/L (ref 3.5–5.1)
Sodium: 136 mEq/L (ref 135–145)
Total Bilirubin: 0.9 mg/dL (ref 0.2–1.2)
Total Protein: 7.3 g/dL (ref 6.0–8.3)

## 2023-07-12 LAB — LIPID PANEL
Cholesterol: 120 mg/dL (ref 0–200)
HDL: 38.7 mg/dL — ABNORMAL LOW (ref >39.00)
LDL Cholesterol: 48 mg/dL (ref 0–99)
NonHDL: 81.61
Total CHOL/HDL Ratio: 3
Triglycerides: 169 mg/dL — ABNORMAL HIGH (ref 0.0–149.0)
VLDL: 33.8 mg/dL (ref 0.0–40.0)

## 2023-07-12 LAB — CBC WITH DIFFERENTIAL/PLATELET
Basophils Absolute: 0 10*3/uL (ref 0.0–0.1)
Basophils Relative: 0.3 % (ref 0.0–3.0)
Eosinophils Absolute: 0.2 10*3/uL (ref 0.0–0.7)
Eosinophils Relative: 2 % (ref 0.0–5.0)
HCT: 45 % (ref 39.0–52.0)
Hemoglobin: 15.2 g/dL (ref 13.0–17.0)
Lymphocytes Relative: 31.4 % (ref 12.0–46.0)
Lymphs Abs: 3 10*3/uL (ref 0.7–4.0)
MCHC: 33.7 g/dL (ref 30.0–36.0)
MCV: 92.4 fl (ref 78.0–100.0)
Monocytes Absolute: 0.8 10*3/uL (ref 0.1–1.0)
Monocytes Relative: 8 % (ref 3.0–12.0)
Neutro Abs: 5.6 10*3/uL (ref 1.4–7.7)
Neutrophils Relative %: 58.3 % (ref 43.0–77.0)
Platelets: 242 10*3/uL (ref 150.0–400.0)
RBC: 4.87 Mil/uL (ref 4.22–5.81)
RDW: 13.7 % (ref 11.5–15.5)
WBC: 9.6 10*3/uL (ref 4.0–10.5)

## 2023-07-12 LAB — PSA: PSA: 2.07 ng/mL (ref 0.10–4.00)

## 2023-07-12 LAB — TSH: TSH: 1.9 u[IU]/mL (ref 0.35–5.50)

## 2023-07-12 LAB — HEMOGLOBIN A1C: Hgb A1c MFr Bld: 6.6 % — ABNORMAL HIGH (ref 4.6–6.5)

## 2023-07-13 NOTE — Progress Notes (Signed)
Lab Review Summary: CBC, CMP, PSA, TSH, A1C, and Lipid Panel results have been evaluated. Findings:  All labs are within normal ranges, indicating good overall health. One exception: Triglycerides are slightly elevated.  Recommendation: To address the elevated triglycerides, consider:  Adding or increasing daily fish oil supplement intake. Reviewing diet for sources of excess sugar and refined carbohydrates. Increasing physical activity if not already at recommended levels.  Follow-up:  Continue current health practices that are contributing to these positive results. Recheck triglyceride levels in 3-6 months to assess improvement.  Overall assessment: Excellent lab results. Minor adjustment needed for triglycerides. Keep up the great work with your health management!

## 2023-08-11 ENCOUNTER — Telehealth: Payer: Self-pay | Admitting: Internal Medicine

## 2023-08-11 NOTE — Telephone Encounter (Signed)
CAN WE PLEASE RESEND RX TO THE PHARMACY BELOW. THE OTHER PHARMACY DID NOT HAVE THE RX.   Prescription Request  08/11/2023  LOV: 07/08/2023  What is the name of the medication or equipment?  ciclopirox (LOPROX) 0.77 % cream   Have you contacted your pharmacy to request a refill? Yes   Which pharmacy would you like this sent to? [id number] - [place], [place] - [address] [place] [address] Phone: [phone] Fax: [phone]   Patient notified that their request is being sent to the clinical staff for review and that they should receive a response within 2 business days.   Please advise at Mobile [phone] (mobile)

## 2023-08-16 ENCOUNTER — Other Ambulatory Visit: Payer: Self-pay

## 2023-08-16 DIAGNOSIS — B351 Tinea unguium: Secondary | ICD-10-CM

## 2023-08-16 MED ORDER — CICLOPIROX OLAMINE 0.77 % EX CREA
TOPICAL_CREAM | Freq: Two times a day (BID) | CUTANEOUS | 1 refills | Status: DC
Start: 2023-08-16 — End: 2024-01-23

## 2023-08-16 NOTE — Telephone Encounter (Signed)
Sent prescription to requested pharmacy. 

## 2023-09-15 ENCOUNTER — Telehealth: Payer: Self-pay | Admitting: Internal Medicine

## 2023-09-15 NOTE — Telephone Encounter (Signed)
Patient's spouse called to let PCP know that he got his flu shot completed @ [facility] on 09/08/23.

## 2023-09-16 NOTE — Telephone Encounter (Signed)
Charted flu vaccine in patient's chart.

## 2023-09-22 LAB — HM DIABETES EYE EXAM

## 2023-12-30 ENCOUNTER — Other Ambulatory Visit: Payer: Self-pay | Admitting: Internal Medicine

## 2023-12-30 DIAGNOSIS — E782 Mixed hyperlipidemia: Secondary | ICD-10-CM

## 2023-12-30 DIAGNOSIS — I10 Essential (primary) hypertension: Secondary | ICD-10-CM

## 2024-01-12 ENCOUNTER — Ambulatory Visit: Payer: Medicare Other | Admitting: Internal Medicine

## 2024-01-15 ENCOUNTER — Other Ambulatory Visit: Payer: Self-pay

## 2024-01-15 ENCOUNTER — Emergency Department (HOSPITAL_BASED_OUTPATIENT_CLINIC_OR_DEPARTMENT_OTHER): Payer: Medicare Other | Admitting: Radiology

## 2024-01-15 ENCOUNTER — Encounter (HOSPITAL_BASED_OUTPATIENT_CLINIC_OR_DEPARTMENT_OTHER): Payer: Self-pay

## 2024-01-15 ENCOUNTER — Emergency Department (HOSPITAL_BASED_OUTPATIENT_CLINIC_OR_DEPARTMENT_OTHER)
Admission: EM | Admit: 2024-01-15 | Discharge: 2024-01-15 | Disposition: A | Discharge status: Home / Self Care | Payer: Medicare Other | Attending: Emergency Medicine | Admitting: Emergency Medicine

## 2024-01-15 DIAGNOSIS — E119 Type 2 diabetes mellitus without complications: Secondary | ICD-10-CM | POA: Diagnosis not present

## 2024-01-15 DIAGNOSIS — I10 Essential (primary) hypertension: Secondary | ICD-10-CM | POA: Insufficient documentation

## 2024-01-15 DIAGNOSIS — R0981 Nasal congestion: Secondary | ICD-10-CM

## 2024-01-15 DIAGNOSIS — J101 Influenza due to other identified influenza virus with other respiratory manifestations: Secondary | ICD-10-CM

## 2024-01-15 DIAGNOSIS — R059 Cough, unspecified: Secondary | ICD-10-CM | POA: Diagnosis present

## 2024-01-15 DIAGNOSIS — J09X2 Influenza due to identified novel influenza A virus with other respiratory manifestations: Secondary | ICD-10-CM | POA: Diagnosis not present

## 2024-01-15 DIAGNOSIS — Z79899 Other long term (current) drug therapy: Secondary | ICD-10-CM | POA: Insufficient documentation

## 2024-01-15 DIAGNOSIS — Z20822 Contact with and (suspected) exposure to covid-19: Secondary | ICD-10-CM | POA: Insufficient documentation

## 2024-01-15 DIAGNOSIS — R051 Acute cough: Secondary | ICD-10-CM

## 2024-01-15 LAB — RESP PANEL BY RT-PCR (RSV, FLU A&B, COVID)  RVPGX2
Influenza A by PCR: POSITIVE — AB
Influenza B by PCR: NEGATIVE
Resp Syncytial Virus by PCR: NEGATIVE
SARS Coronavirus 2 by RT PCR: NEGATIVE

## 2024-01-15 MED ORDER — BENZONATATE 100 MG PO CAPS: 100.0000 mg | ORAL_CAPSULE | Freq: Three times a day (TID) | ORAL | 0 refills | Status: DC

## 2024-01-15 MED ORDER — OSELTAMIVIR PHOSPHATE 75 MG PO CAPS: 75.0000 mg | ORAL_CAPSULE | Freq: Two times a day (BID) | ORAL | 0 refills | Status: DC

## 2024-01-15 NOTE — ED Triage Notes (Signed)
In for eval of a cough and sinus pressure since Thursday. Temp 100.3 yesterday am.

## 2024-01-15 NOTE — Discharge Instructions (Signed)
Your history, exam, workup today confirm you have the flu as the cause of your symptoms.  The x-ray did not show pneumonia.  Your vital signs were reassuring and we feel safe with discharge home.  Please consider taking the Tamiflu as we discussed to help shorten the duration of your illness and consider the cough medicine Tessalon to help with the coughing.  Please treat for any fevers you have with Motrin and Tylenol and rest and stay hydrated.  If any symptoms change or worsen acutely, please return to the nearest emergency department.

## 2024-01-15 NOTE — ED Provider Notes (Signed)
[facility] EMERGENCY DEPARTMENT AT [facility] Provider Note   CSN: [id number] Arrival date & time: 01/15/24  4098     History  Chief Complaint  Patient presents with   Cough    Christian Dodson is a 78 y.o. male.  The history is provided by the patient and medical records. No language interpreter was used.  Cough Cough characteristics:  Non-productive and productive Severity:  Severe Onset quality:  Gradual Duration:  4 days Timing:  Constant Progression:  Worsening Chronicity:  New Context: sick contacts (wife)   Relieved by:  Nothing Worsened by:  Nothing Ineffective treatments:  None tried Associated symptoms: chills, fever, headaches (frontal sinus type ha per report), rhinorrhea and sinus congestion   Associated symptoms: no chest pain, no diaphoresis, no rash, no shortness of breath and no wheezing   Risk factors: recent travel (to [employer])        Home Medications Prior to Admission medications   Medication Sig Start Date End Date Taking? Authorizing Provider  Ascorbic Acid (VITAMIN C PO) Take 1 tablet by mouth every morning.    [provider]  cetirizine (ZYRTEC) 10 MG tablet Take 10 mg by mouth every morning.    [provider]  ciclopirox (LOPROX) 0.77 % cream Apply topically 2 (two) times daily. 08/16/23   Lula Olszewski, MD  finasteride (PROSCAR) 5 MG tablet Take 1 tablet (5 mg total) by mouth at bedtime. 01/06/23   Lula Olszewski, MD  GLUCOSAMINE-CHONDROITIN PO Take 1 tablet by mouth every morning.    [provider]  hydrochlorothiazide (HYDRODIURIL) 25 MG tablet TAKE 1 TABLET BY MOUTH DAILY 01/01/24   Lula Olszewski, MD  lisinopril (ZESTRIL) 40 MG tablet TAKE 1 TABLET BY MOUTH DAILY 01/01/24   Lula Olszewski, MD  Multiple Vitamins-Minerals (ZINC PO) Take 1 tablet by mouth every morning.    [provider]  omeprazole (PRILOSEC) 20 MG capsule Take 20 mg by mouth daily.    [provider]   simvastatin (ZOCOR) 20 MG tablet TAKE 1 TABLET BY MOUTH AT  BEDTIME 01/01/24   Lula Olszewski, MD  VITAMIN D PO Take 1 tablet by mouth every morning.    [provider]      Allergies    Patient has no known allergies.    Review of Systems   Review of Systems  Constitutional:  Positive for chills, fatigue and fever. Negative for diaphoresis.  HENT:  Positive for congestion and rhinorrhea.   Eyes:  Negative for visual disturbance.  Respiratory:  Positive for cough. Negative for chest tightness, shortness of breath, wheezing and stridor.   Cardiovascular:  Negative for chest pain, palpitations and leg swelling.  Gastrointestinal:  Negative for abdominal pain, constipation, diarrhea, nausea and vomiting.  Genitourinary:  Negative for dysuria and flank pain.  Musculoskeletal:  Negative for back pain, neck pain and neck stiffness.  Skin:  Negative for rash and wound.  Neurological:  Positive for headaches (frontal sinus type ha per report). Negative for facial asymmetry.  Psychiatric/Behavioral:  Negative for agitation and confusion.   All other systems reviewed and are negative.   Physical Exam Updated Vital Signs BP (!) 151/91 (BP Location: Right Arm)   Pulse 95   Temp 98.6 F (37 C) (Oral)   Resp 14   SpO2 97%  Physical Exam Vitals and nursing note reviewed.  Constitutional:      General: He is not in acute distress.    Appearance: He  is well-developed. He is not ill-appearing, toxic-appearing or diaphoretic.  HENT:     Head: Normocephalic and atraumatic.     Nose: Congestion and rhinorrhea present.     Mouth/Throat:     Mouth: Mucous membranes are moist.     Pharynx: No oropharyngeal exudate or posterior oropharyngeal erythema.  Eyes:     Extraocular Movements: Extraocular movements intact.     Conjunctiva/sclera: Conjunctivae normal.     Pupils: Pupils are equal, round, and reactive to light.  Cardiovascular:     Rate and Rhythm: Normal rate and regular  rhythm.     Heart sounds: No murmur heard. Pulmonary:     Effort: Pulmonary effort is normal. No respiratory distress.     Breath sounds: No stridor. Rhonchi present. No wheezing or rales.  Chest:     Chest wall: No tenderness.  Abdominal:     General: Abdomen is flat.     Palpations: Abdomen is soft.     Tenderness: There is no abdominal tenderness. There is no left CVA tenderness, guarding or rebound.  Musculoskeletal:        General: No swelling or tenderness.     Cervical back: Neck supple. No tenderness.     Right lower leg: No edema.     Left lower leg: No edema.  Skin:    General: Skin is warm and dry.     Capillary Refill: Capillary refill takes less than 2 seconds.     Findings: No erythema or rash.  Neurological:     Mental Status: He is alert.  Psychiatric:        Mood and Affect: Mood normal.     ED Results / Procedures / Treatments   Labs (all labs ordered are listed, but only abnormal results are displayed) Labs Reviewed  RESP PANEL BY RT-PCR (RSV, FLU A&B, COVID)  RVPGX2 - Abnormal; Notable for the following components:      Result Value   Influenza A by PCR POSITIVE (*)    All other components within normal limits    EKG None  Radiology DG Chest 2 View Result Date: 01/15/2024 CLINICAL DATA:  Cough, shortness of breath EXAM: CHEST - 2 VIEW COMPARISON:  12/19/2021 FINDINGS: Lungs are clear.  Moderate hiatal hernia with fluid level. Heart size and mediastinal contours are within normal limits. No effusion. Vertebral endplate spurring at multiple levels in the lower thoracic spine. IMPRESSION: 1. No acute cardiopulmonary disease. 2. Moderate hiatal hernia. Electronically Signed   By: Christian Dodson M.D.   On: 01/15/2024 08:18    Procedures Procedures    Medications Ordered in ED Medications - No data to display  ED Course/ Medical Decision Making/ A&P                                 Medical Decision Making Amount and/or Complexity of Data  Reviewed Radiology: ordered.    [name] is a 78 y.o. male with a past medical history significant for hypertension, GERD, previous cholecystectomy, hyperlipidemia, diabetes, and BPH who presents with congestion, cough, fever, and sinus pressure with congestion.  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  He denies any known contacts with flu or COVID or RSV but is concerned about this.  His wife also had URI symptoms over  the last few days.  He has had symptoms for about 4 days and cough seems to be worsening with some phlegm intermittently.  He denies any hemoptysis.  He denies any chest pain or shortness of breath focally.  He denies any nausea, vomiting, constipation, diarrhea, or urinary changes.  Denies a history of blood clots or any leg pain or leg swelling.  No pleuritic discomfort.  He reports some mild sinus pressure and he reports he is a history of sinus infections in the past in [place].  Denies other complaints at this time.  On exam, lungs had some very faint rhonchi but no wheezing or rales.  Chest was nontender.  Abdomen nontender.  Back and neck nontender.  No focal neurologic deficits.  Pupils symmetric and reactive with normal extract movements.  Legs nontender nonedematous.  Clear speech.   Given the patient's description of symptoms, I suspect is a viral illness and with his wife having similar symptoms I suspect it may be flu RSV or COVID given the ongoing illness in the community.  Will get viral swab will also get chest x-ray given his rhonchi.  Will hold on other labs at this time.  Anticipate reassessment after workup to determine disposition.  X-rays not show pneumonia and patient is positive for flu A.  Suspect this is the cause of all symptoms.  Will give patient prescription for Tamiflu and Tessalon and he will follow-up with PCP.   Patient agrees with plan of care and will be discharged for outpatient follow-up.         Final Clinical Impression(s) / ED Diagnoses Final diagnoses:  Influenza A  Acute cough  Head congestion    Rx / DC Orders ED Discharge Orders          Ordered    oseltamivir (TAMIFLU) 75 MG capsule  Every 12 hours        01/15/24 0946    benzonatate (TESSALON) 100 MG capsule  Every 8 hours        01/15/24 0946            Clinical Impression: 1. Influenza A   2. Acute cough   3. Head congestion     Disposition: Discharge  Condition: Good  I have discussed the results, Dx and Tx plan with the pt(& family if present). He/she/they expressed understanding and agree(s) with the plan. Discharge instructions discussed at great length. Strict return precautions discussed and pt &/or family have verbalized understanding of the instructions. No further questions at time of discharge.    New Prescriptions   BENZONATATE (TESSALON) 100 MG CAPSULE    Take 1 capsule (100 mg total) by mouth every 8 (eight) hours.   OSELTAMIVIR (TAMIFLU) 75 MG CAPSULE    Take 1 capsule (75 mg total) by mouth every 12 (twelve) hours.    Follow Up: Lula Olszewski, MD [address] [place] [address] [phone]     [facility] Emergency Department at [facility] [address] [place] [address] [phone]       Plumer Mittelstaedt, Canary Brim, MD 01/15/24 [phone]

## 2024-01-23 ENCOUNTER — Encounter: Payer: Self-pay | Admitting: Internal Medicine

## 2024-01-23 ENCOUNTER — Ambulatory Visit: Payer: Medicare Other | Admitting: Internal Medicine

## 2024-01-23 VITALS — BP 102/60 | HR 85 | Temp 97.7°F | Ht 66.0 in | Wt 173.0 lb

## 2024-01-23 DIAGNOSIS — E119 Type 2 diabetes mellitus without complications: Secondary | ICD-10-CM

## 2024-01-23 DIAGNOSIS — E782 Mixed hyperlipidemia: Secondary | ICD-10-CM

## 2024-01-23 DIAGNOSIS — I10 Essential (primary) hypertension: Secondary | ICD-10-CM | POA: Diagnosis not present

## 2024-01-23 DIAGNOSIS — K219 Gastro-esophageal reflux disease without esophagitis: Secondary | ICD-10-CM

## 2024-01-23 DIAGNOSIS — Z23 Encounter for immunization: Secondary | ICD-10-CM

## 2024-01-23 DIAGNOSIS — J111 Influenza due to unidentified influenza virus with other respiratory manifestations: Secondary | ICD-10-CM

## 2024-01-23 DIAGNOSIS — B351 Tinea unguium: Secondary | ICD-10-CM

## 2024-01-23 LAB — CBC WITH DIFFERENTIAL/PLATELET
Basophils Absolute: 0 10*3/uL (ref 0.0–0.1)
Basophils Relative: 0.3 % (ref 0.0–3.0)
Eosinophils Absolute: 0.2 10*3/uL (ref 0.0–0.7)
Eosinophils Relative: 2.9 % (ref 0.0–5.0)
HCT: 45.2 % (ref 39.0–52.0)
Hemoglobin: 15.6 g/dL (ref 13.0–17.0)
Lymphocytes Relative: 40.4 % (ref 12.0–46.0)
Lymphs Abs: 2.9 10*3/uL (ref 0.7–4.0)
MCHC: 34.4 g/dL (ref 30.0–36.0)
MCV: 92.6 fL (ref 78.0–100.0)
Monocytes Absolute: 0.5 10*3/uL (ref 0.1–1.0)
Monocytes Relative: 7.5 % (ref 3.0–12.0)
Neutro Abs: 3.5 10*3/uL (ref 1.4–7.7)
Neutrophils Relative %: 48.9 % (ref 43.0–77.0)
Platelets: 270 10*3/uL (ref 150.0–400.0)
RBC: 4.88 Mil/uL (ref 4.22–5.81)
RDW: 13.2 % (ref 11.5–15.5)
WBC: 7.2 10*3/uL (ref 4.0–10.5)

## 2024-01-23 LAB — COMPREHENSIVE METABOLIC PANEL
ALT: 29 U/L (ref 0–53)
AST: 21 U/L (ref 0–37)
Albumin: 4.5 g/dL (ref 3.5–5.2)
Alkaline Phosphatase: 56 U/L (ref 39–117)
BUN: 21 mg/dL (ref 6–23)
CO2: 24 meq/L (ref 19–32)
Calcium: 9.5 mg/dL (ref 8.4–10.5)
Chloride: 102 meq/L (ref 96–112)
Creatinine, Ser: 1.04 mg/dL (ref 0.40–1.50)
GFR: 70.23 mL/min (ref >60.00)
Glucose, Bld: 162 mg/dL — ABNORMAL HIGH (ref 70–99)
Potassium: 3.6 meq/L (ref 3.5–5.1)
Sodium: 137 meq/L (ref 135–145)
Total Bilirubin: 0.7 mg/dL (ref 0.2–1.2)
Total Protein: 7.2 g/dL (ref 6.0–8.3)

## 2024-01-23 LAB — MICROALBUMIN / CREATININE URINE RATIO
Creatinine,U: 105.1 mg/dL
Microalb Creat Ratio: 0.7 mg/g (ref 0.0–30.0)
Microalb, Ur: <0.7 mg/dL (ref 0.0–1.9)

## 2024-01-23 LAB — LIPID PANEL
Cholesterol: 117 mg/dL (ref 0–200)
HDL: 32.4 mg/dL — ABNORMAL LOW (ref >39.00)
LDL Cholesterol: 50 mg/dL (ref 0–99)
NonHDL: 84.54
Total CHOL/HDL Ratio: 4
Triglycerides: 173 mg/dL — ABNORMAL HIGH (ref 0.0–149.0)
VLDL: 34.6 mg/dL (ref 0.0–40.0)

## 2024-01-23 LAB — HEMOGLOBIN A1C: Hgb A1c MFr Bld: 6.9 % — ABNORMAL HIGH (ref 4.6–6.5)

## 2024-01-23 MED ORDER — CICLOPIROX OLAMINE 0.77 % EX CREA: TOPICAL_CREAM | Freq: Two times a day (BID) | CUTANEOUS | 1 refills | Status: DC

## 2024-01-23 MED ORDER — OMEPRAZOLE 10 MG PO CPDR: 10.0000 mg | DELAYED_RELEASE_CAPSULE | Freq: Every day | ORAL | 3 refills | Status: DC

## 2024-01-23 MED ORDER — CLOTRIMAZOLE 1 % EX OINT: 1.0000 | TOPICAL_OINTMENT | CUTANEOUS | 11 refills | Status: DC

## 2024-01-23 MED ORDER — UREA 40 % EX LOTN: 1.0000 | TOPICAL_LOTION | Freq: Every day | CUTANEOUS | 0 refills | Status: DC

## 2024-01-23 NOTE — Assessment & Plan Note (Signed)
Onychomycosis A previous adverse reaction to oral antifungal medication led to the use of a topical antifungal cream. Plan to add a second cream and urea to enhance treatment efficacy, with an 80% chance of cure over a year with regular use.

## 2024-01-23 NOTE — Patient Instructions (Signed)
VISIT SUMMARY:  During your routine check-up, we reviewed your blood pressure, cholesterol, and diabetes management. We also discussed your recent flu recovery, occasional heartburn, and treatment for toenail fungus. Your blood pressure and cholesterol levels are well-controlled with your current medications, and we have planned further tests to monitor your diabetes.  YOUR PLAN:  -INFLUENZA: You recently had a mild case of the flu despite being vaccinated, which resolved quickly with Tamiflu. We discussed the importance of staying up to date with vaccinations, especially for pneumonia, given your diabetes.  -TYPE 2 DIABETES MELLITUS: Your diabetes appears well-controlled with diet, but we need to confirm this with blood work. Regular monitoring of your blood sugar levels at home is important, and we may need to consider medication if your control is not adequate. We have ordered a comprehensive blood panel and a urine test to check for kidney damage.  -HYPERTENSION: Your blood pressure is well-controlled with your current medications, but it was lower than usual today. We discussed possibly reducing your hydrochlorothiazide if your blood pressure remains stable. You should monitor your blood pressure at home and let us know if it stays under 130/80.  -HYPERLIPIDEMIA: Your cholesterol levels have improved with simvastatin, but your HDL is still low, and triglycerides are slightly high. We recommend continuing with simvastatin and following a low-carb, Mediterranean diet to help manage your cholesterol and diabetes.  -GASTROESOPHAGEAL REFLUX DISEASE (GERD): You are currently taking omeprazole for heartburn. We discussed the long-term risks of this medication and recommended tapering to a lower dose and using Pepcid Complete as needed. This will help prevent severe heartburn and reduce the risk of esophageal cancer.  -ONYCHOMYCOSIS: You had a severe reaction to oral antifungal medication for toenail  fungus and are now using a topical cream. We plan to add a second cream and urea lotion to improve treatment effectiveness. Regular use can lead to an 80% cure rate over a year.  -GENERAL HEALTH MAINTENANCE: You are up to date on your diabetic eye and foot exams but need a pneumonia vaccine booster. We have scheduled your Medicare wellness visit and will administer the pneumonia vaccine booster. Regular vaccinations and screenings are important, especially with diabetes.  INSTRUCTIONS:  Please follow up in six months to review your lab results and adjust medications if necessary. Your Medicare wellness visit is scheduled for March 5th, and we will administer your pneumonia vaccine booster at that time. Additionally, please monitor your blood pressure at home and inform us if it remains under 130/80.

## 2024-01-23 NOTE — Assessment & Plan Note (Signed)
Hyperlipidemia On simvastatin 20 mg, with improved and consistently low cholesterol levels. Advised reducing carbohydrate intake due to previously high triglycerides and emphasized a Mediterranean diet for heart health.

## 2024-01-23 NOTE — Assessment & Plan Note (Signed)
Hypertension Currently taking lisinopril 40 mg and hydrochlorothiazide 25 mg. Blood pressure today was 102/60, which is lower than usual. Discussed the potential reduction of hydrochlorothiazide if blood pressure remains controlled. Advised home monitoring and medication adjustment if consistently below 130/80.

## 2024-01-23 NOTE — Assessment & Plan Note (Signed)
Type 2 Diabetes Mellitus Diabetes is well-controlled with diet alone. Previous visits showed well-controlled glucose levels. Blood work is planned to monitor the current status, with an emphasis on regular monitoring every six months to prevent unnoticed deterioration.

## 2024-01-23 NOTE — Assessment & Plan Note (Signed)
Gastroesophageal Reflux Disease (GERD) Currently on omeprazole 20 mg daily. Discussed risks of long-term use, including dementia and kidney disease. Advised switching to Pepcid Complete and tapering off omeprazole, along with dietary modifications to avoid trigger foods.

## 2024-01-23 NOTE — Progress Notes (Signed)
==============================  [place] [facility]: [phone]   -- Medical Office Visit --  Patient: Christian Dodson      Age: 78 y.o.       Sex:  male  Date:   01/23/2024 Today's Healthcare Provider: Lula Olszewski, MD  ==============================   CHIEF COMPLAINT: 6 month follow-up, Medical Management of Chronic Issues, Hypertension, Diabetes, and Hyperlipidemia  SUBJECTIVE: Background This is a 78 y.o. male who has HTN (hypertension); GERD (gastroesophageal reflux disease); BPH (benign prostatic hyperplasia); Controlled type 2 diabetes mellitus (HCC); S/P total right hip arthroplasty; Overweight; Post-cholecystectomy syndrome; Seasonal allergies; Trigger finger; Hyperlipidemia; and Onychomycosis on their problem list.  History of Present Illness The patient presents for a routine check-up to monitor blood pressure, cholesterol, and diabetes control.  No cardiovascular symptoms such as shortness of breath or chest pain. He has gained a couple of pounds recently, attributing it to reduced outdoor activities during winter.  He recently recovered from the flu despite having received a flu shot. He experienced mild symptoms, including a cough and fever, which resolved within 48 hours with Tamiflu. His wife also contracted the flu and was treated with an inhaler and Flonase.  His blood pressure today was 102/60, lower than his usual range of 120-130. He is taking lisinopril 40 mg and hydrochlorothiazide 25 mg for blood pressure management.  He is on simvastatin 20 mg for cholesterol. His diabetes is managed with diet alone, and he has not been checking his blood sugar at home.  He has a history of heartburn, currently managed with omeprazole and Pepcid Complete. Heartburn is food-related and occurs occasionally.  He experienced a reaction to medication prescribed for toe fungus, including nausea, chills, fever, diarrhea, and vomiting. He is now using  a topical cream for treatment.  He has a history of pneumonia and bronchitis and is due for a pneumonia vaccine. He mentions having had pneumonia three or four times in the past.  Reviewed chart records that patient  has a past medical history of Arthritis, BPH (benign prostatic hyperplasia) (10/19/2013), GERD (gastroesophageal reflux disease), History of cholecystectomy (01/06/2023), History of stomach ulcers, Hypertension, Pneumonia, Pre-diabetes, Seasonal allergies (01/06/2023), and Skin cancer. Discussed Past Medical History - History of hypertension - History of diabetes - History of heartburn - History of toe fungus - History of skin cancer - History of stomach ulcers (when was a kid)  Today's Verbally Confirmed Medications - Omeprazole - Lisinopril 40 mg - Hydrochlorothiazide 25 mg - Simvastatin 20 mg Current Outpatient Medications on File Prior to Visit  Medication Sig   amoxicillin (AMOXIL) 500 MG capsule For dental procedures.   Ascorbic Acid (VITAMIN C PO) Take 1 tablet by mouth every morning.   benzonatate (TESSALON) 100 MG capsule Take 1 capsule (100 mg total) by mouth every 8 (eight) hours.   cetirizine (ZYRTEC) 10 MG tablet Take 10 mg by mouth every morning.   clobetasol cream (TEMOVATE) 0.05 % Apply topically 2 (two) times daily.   finasteride (PROSCAR) 5 MG tablet Take 1 tablet (5 mg total) by mouth at bedtime.   GLUCOSAMINE-CHONDROITIN PO Take 1 tablet by mouth every morning.   hydrochlorothiazide (HYDRODIURIL) 25 MG tablet TAKE 1 TABLET BY MOUTH DAILY   lisinopril (ZESTRIL) 40 MG tablet TAKE 1 TABLET BY MOUTH DAILY   Multiple Vitamins-Minerals (ZINC PO) Take 1 tablet by mouth every morning.   omeprazole (PRILOSEC) 20 MG capsule Take 20 mg by mouth daily.   simvastatin (ZOCOR) 20 MG tablet  TAKE 1 TABLET BY MOUTH AT  BEDTIME   VITAMIN D PO Take 1 tablet by mouth every morning.   No current facility-administered medications on file prior to visit.   Medications  Discontinued During This Encounter  Medication Reason   oseltamivir (TAMIFLU) 75 MG capsule    ciclopirox (LOPROX) 0.77 % cream Reorder      Objective   Physical Exam     01/23/2024    8:08 AM 01/15/2024    8:30 AM 01/15/2024    8:00 AM  Vitals with BMI  Height 5\' 6"     Weight 173 lbs    BMI 27.94    Systolic 102 119 045  Diastolic 60 75 77  Pulse 85 95 91   Wt Readings from Last 10 Encounters:  01/23/24 173 lb (78.5 kg)  01/15/24 170 lb (77.1 kg)  07/08/23 170 lb 3.2 oz (77.2 kg)  02/22/23 172 lb 9.6 oz (78.3 kg)  01/06/23 174 lb 3.2 oz (79 kg)  05/03/22 169 lb 12.8 oz (77 kg)  01/19/22 165 lb 6.4 oz (75 kg)  01/07/22 165 lb 6.4 oz (75 kg)  12/28/21 165 lb 12.8 oz (75.2 kg)  12/20/21 166 lb (75.3 kg)   Vital signs reviewed.  Nursing notes reviewed. Weight trend reviewed. Abnormalities and Problem-Specific physical exam findings:  truncal adiposity   General Appearance:  No acute distress appreciable.   Well-groomed, healthy-appearing male.  Well proportioned with no abnormal fat distribution.  Good muscle tone. Pulmonary:  Normal work of breathing at rest, no respiratory distress apparent. SpO2: 97 %  Musculoskeletal: All extremities are intact.  Neurological:  Awake, alert, oriented, and engaged.  No obvious focal neurological deficits or cognitive impairments.  Sensorium seems unclouded.   Speech is clear and coherent with logical content. Psychiatric:  Appropriate mood, pleasant and cooperative demeanor, thoughtful and engaged during the exam    Results for orders placed or performed in visit on 01/23/24  HM DIABETES EYE EXAM  Result Value Ref Range   HM Diabetic Eye Exam    Results for orders placed or performed in visit on 01/23/24  CBC with Differential/Platelet  Result Value Ref Range   WBC 7.2 4.0 - 10.5 K/uL   RBC 4.88 4.22 - 5.81 Mil/uL   Hemoglobin 15.6 13.0 - 17.0 g/dL   HCT 40.9 81.1 - 91.4 %   MCV 92.6 78.0 - 100.0 fl   MCHC 34.4 30.0 - 36.0 g/dL    RDW 78.2 95.6 - 21.3 %   Platelets 270.0 150.0 - 400.0 K/uL   Neutrophils Relative % 48.9 43.0 - 77.0 %   Lymphocytes Relative 40.4 12.0 - 46.0 %   Monocytes Relative 7.5 3.0 - 12.0 %   Eosinophils Relative 2.9 0.0 - 5.0 %   Basophils Relative 0.3 0.0 - 3.0 %   Neutro Abs 3.5 1.4 - 7.7 K/uL   Lymphs Abs 2.9 0.7 - 4.0 K/uL   Monocytes Absolute 0.5 0.1 - 1.0 K/uL   Eosinophils Absolute 0.2 0.0 - 0.7 K/uL   Basophils Absolute 0.0 0.0 - 0.1 K/uL  Comprehensive metabolic panel  Result Value Ref Range   Sodium 137 135 - 145 mEq/L   Potassium 3.6 3.5 - 5.1 mEq/L   Chloride 102 96 - 112 mEq/L   CO2 24 19 - 32 mEq/L   Glucose, Bld 162 (H) 70 - 99 mg/dL   BUN 21 6 - 23 mg/dL   Creatinine, Ser 0.86 0.40 - 1.50 mg/dL   Total Bilirubin  0.7 0.2 - 1.2 mg/dL   Alkaline Phosphatase 56 39 - 117 U/L   AST 21 0 - 37 U/L   ALT 29 0 - 53 U/L   Total Protein 7.2 6.0 - 8.3 g/dL   Albumin 4.5 3.5 - 5.2 g/dL   GFR 14.78 >29.56 mL/min   Calcium 9.5 8.4 - 10.5 mg/dL  Lipid panel  Result Value Ref Range   Cholesterol 117 0 - 200 mg/dL   Triglycerides 213.0 (H) 0.0 - 149.0 mg/dL   HDL 86.57 (L) >84.69 mg/dL   VLDL 62.9 0.0 - 52.8 mg/dL   LDL Cholesterol 50 0 - 99 mg/dL   Total CHOL/HDL Ratio 4    NonHDL 84.54   Hemoglobin A1c  Result Value Ref Range   Hgb A1c MFr Bld 6.9 (H) 4.6 - 6.5 %  Microalbumin / creatinine urine ratio  Result Value Ref Range   Microalb, Ur <0.7 0.0 - 1.9 mg/dL   Creatinine,U 413.2 mg/dL   Microalb Creat Ratio 0.7 0.0 - 30.0 mg/g   Scanned Document on 01/23/2024  Component Date Value   HM Diabetic Eye Exam 09/22/2023    Office Visit on 01/23/2024  Component Date Value   WBC 01/23/2024 7.2    RBC 01/23/2024 4.88    Hemoglobin 01/23/2024 15.6    HCT 01/23/2024 45.2    MCV 01/23/2024 92.6    MCHC 01/23/2024 34.4    RDW 01/23/2024 13.2    Platelets 01/23/2024 270.0    Neutrophils Relative % 01/23/2024 48.9    Lymphocytes Relative 01/23/2024 40.4    Monocytes  Relative 01/23/2024 7.5    Eosinophils Relative 01/23/2024 2.9    Basophils Relative 01/23/2024 0.3    Neutro Abs 01/23/2024 3.5    Lymphs Abs 01/23/2024 2.9    Monocytes Absolute 01/23/2024 0.5    Eosinophils Absolute 01/23/2024 0.2    Basophils Absolute 01/23/2024 0.0    Sodium 01/23/2024 137    Potassium 01/23/2024 3.6    Chloride 01/23/2024 102    CO2 01/23/2024 24    Glucose, Bld 01/23/2024 162 (H)    BUN 01/23/2024 21    Creatinine, Ser 01/23/2024 1.04    Total Bilirubin 01/23/2024 0.7    Alkaline Phosphatase 01/23/2024 56    AST 01/23/2024 21    ALT 01/23/2024 29    Total Protein 01/23/2024 7.2    Albumin 01/23/2024 4.5    GFR 01/23/2024 70.23    Calcium 01/23/2024 9.5    Cholesterol 01/23/2024 117    Triglycerides 01/23/2024 173.0 (H)    HDL 01/23/2024 32.40 (L)    VLDL 01/23/2024 34.6    LDL Cholesterol 01/23/2024 50    Total CHOL/HDL Ratio 01/23/2024 4    NonHDL 01/23/2024 84.54    Hgb A1c MFr Bld 01/23/2024 6.9 (H)    Microalb, Ur 01/23/2024 <0.7    Creatinine,U 01/23/2024 105.1    Microalb Creat Ratio 01/23/2024 0.7   Admission on 01/15/2024, Discharged on 01/15/2024  Component Date Value   SARS Coronavirus 2 by RT* 01/15/2024 NEGATIVE    Influenza A by PCR 01/15/2024 POSITIVE (A)    Influenza B by PCR 01/15/2024 NEGATIVE    Resp Syncytial Virus by * 01/15/2024 NEGATIVE   Lab on 07/12/2023  Component Date Value   WBC 07/12/2023 9.6    RBC 07/12/2023 4.87    Hemoglobin 07/12/2023 15.2    HCT 07/12/2023 45.0    MCV 07/12/2023 92.4    MCHC 07/12/2023 33.7    RDW 07/12/2023 13.7  Platelets 07/12/2023 242.0    Neutrophils Relative % 07/12/2023 58.3    Lymphocytes Relative 07/12/2023 31.4    Monocytes Relative 07/12/2023 8.0    Eosinophils Relative 07/12/2023 2.0    Basophils Relative 07/12/2023 0.3    Neutro Abs 07/12/2023 5.6    Lymphs Abs 07/12/2023 3.0    Monocytes Absolute 07/12/2023 0.8    Eosinophils Absolute 07/12/2023 0.2    Basophils  Absolute 07/12/2023 0.0    Sodium 07/12/2023 136    Potassium 07/12/2023 4.3    Chloride 07/12/2023 100    CO2 07/12/2023 27    Glucose, Bld 07/12/2023 144 (H)    BUN 07/12/2023 22    Creatinine, Ser 07/12/2023 1.11    Total Bilirubin 07/12/2023 0.9    Alkaline Phosphatase 07/12/2023 71    AST 07/12/2023 11    ALT 07/12/2023 19    Total Protein 07/12/2023 7.3    Albumin 07/12/2023 4.5    GFR 07/12/2023 65.19    Calcium 07/12/2023 10.3    TSH 07/12/2023 1.90    Cholesterol 07/12/2023 120    Triglycerides 07/12/2023 169.0 (H)    HDL 07/12/2023 38.70 (L)    VLDL 07/12/2023 33.8    LDL Cholesterol 07/12/2023 48    Total CHOL/HDL Ratio 07/12/2023 3    NonHDL 07/12/2023 81.61    PSA 07/12/2023 2.07    Hgb A1c MFr Bld 07/12/2023 6.6 (H)   Scanned Document on 06/01/2023  Component Date Value   HM Diabetic Eye Exam 12/01/2022     HM Diabetic Eye Exam 11/02/2022     HM Diabetic Eye Exam 10/25/2022    No image results found. DG Chest 2 View Result Date: 01/15/2024 CLINICAL DATA:  Cough, shortness of breath EXAM: CHEST - 2 VIEW COMPARISON:  12/19/2021 FINDINGS: Lungs are clear.  Moderate hiatal hernia with fluid level. Heart size and mediastinal contours are within normal limits. No effusion. Vertebral endplate spurring at multiple levels in the lower thoracic spine. IMPRESSION: 1. No acute cardiopulmonary disease. 2. Moderate hiatal hernia. Electronically Signed   By: Corlis Leak M.D.   On: 01/15/2024 08:18  DG Chest 2 View Result Date: 01/15/2024 CLINICAL DATA:  Cough, shortness of breath EXAM: CHEST - 2 VIEW COMPARISON:  12/19/2021 FINDINGS: Lungs are clear.  Moderate hiatal hernia with fluid level. Heart size and mediastinal contours are within normal limits. No effusion. Vertebral endplate spurring at multiple levels in the lower thoracic spine. IMPRESSION: 1. No acute cardiopulmonary disease. 2. Moderate hiatal hernia. Electronically Signed   By: Corlis Leak M.D.   On: 01/15/2024 08:18        Assessment & Plan Controlled type 2 diabetes mellitus without complication, without long-term current use of insulin (HCC) Type 2 Diabetes Mellitus Diabetes is well-controlled with diet alone. Previous visits showed well-controlled glucose levels. Blood work is planned to monitor the current status, with an emphasis on regular monitoring every six months to prevent unnoticed deterioration. Primary hypertension Hypertension Currently taking lisinopril 40 mg and hydrochlorothiazide 25 mg. Blood pressure today was 102/60, which is lower than usual. Discussed the potential reduction of hydrochlorothiazide if blood pressure remains controlled. Advised home monitoring and medication adjustment if consistently below 130/80. Mixed hyperlipidemia Hyperlipidemia On simvastatin 20 mg, with improved and consistently low cholesterol levels. Advised reducing carbohydrate intake due to previously high triglycerides and emphasized a Mediterranean diet for heart health. Gastroesophageal reflux disease, unspecified whether esophagitis present Gastroesophageal Reflux Disease (GERD) Currently on omeprazole 20 mg daily. Discussed risks of long-term use, including  dementia and kidney disease. Advised switching to Pepcid Complete and tapering off omeprazole, along with dietary modifications to avoid trigger foods. Onychomycosis Onychomycosis A previous adverse reaction to oral antifungal medication led to the use of a topical antifungal cream. Plan to add a second cream and urea to enhance treatment efficacy, with an 80% chance of cure over a year with regular use. Need for vaccination against Streptococcus pneumoniae Vaccine given after discussion Influenza Influenza A recent episode of influenza occurred despite vaccination, but symptoms resolved within 48 hours with Tamiflu. This was a mild case compared to others. The importance of vaccinations, including the pneumococcal vaccine, was emphasized.   General  Health Maintenance Due for a pneumococcal booster as it has been more than five years since the last dose. Confirmed up-to-date on diabetic eye and foot exams. Scheduled a Medicare wellness visit for March 5th and requested the release of records for the eye exam and previous pneumococcal vaccines.  Follow-up A follow-up appointment is scheduled in six months to review lab results and adjust medications if necessary.      Orders Placed During this Encounter:   Orders Placed This Encounter  Procedures   Pneumococcal conjugate vaccine 20-valent (Prevnar 20)   CBC with Differential/Platelet   Comprehensive metabolic panel    [place]    Has the patient fasted?:   No   Lipid panel    [place]    Has the patient fasted?:   No   Hemoglobin A1c    [place]   Microalbumin / creatinine urine ratio   Meds ordered this encounter  Medications   ciclopirox (LOPROX) 0.77 % cream    Sig: Apply topically 2 (two) times daily.    Dispense:  90 g    Refill:  1   Clotrimazole 1 % OINT    Sig: Apply 1 Application topically 2 (two) times a week.    Dispense:  30 g    Refill:  11   Urea 40 % LOTN    Sig: Apply 1 Application topically daily. To help toenail antifungals work    Dispense:  325 mL    Refill:  0   omeprazole (PRILOSEC) 10 MG capsule    Sig: Take 1 capsule (10 mg total) by mouth daily.    Dispense:  90 capsule    Refill:  3    This document was synthesized by artificial intelligence ([employer]bridge) using HIPAA-compliant recording of the clinical interaction;   We discussed the use of AI scribe software for clinical note transcription with the patient, who gave verbal consent to proceed.    Additional Info: This encounter employed state-of-the-art, real-time, collaborative documentation. The patient actively reviewed and assisted in updating their electronic medical record on a shared screen, ensuring transparency and facilitating joint problem-solving for the problem list, overview, and plan.  This approach promotes accurate, informed care. The treatment plan was discussed and reviewed in detail, including medication safety, potential side effects, and all patient questions. We confirmed understanding and comfort with the plan. Follow-up instructions were established, including contacting the office for any concerns, returning if symptoms worsen, persist, or new symptoms develop, and precautions for potential emergency department visits.

## 2024-02-07 ENCOUNTER — Telehealth: Payer: Self-pay | Admitting: Internal Medicine

## 2024-02-07 NOTE — Telephone Encounter (Signed)
 I am not currently taking new patients sorry

## 2024-02-07 NOTE — Telephone Encounter (Signed)
 Copied from CRM [id number]. Topic: Appointments - Scheduling Inquiry for Clinic >> Feb 06, 2024  4:36 PM Christian Dodson wrote: Reason for CRM: patient was once a a patient of Dr Mikey Bussing he moved to another practice that was closer to his home.He's not happy with his provider he is seeing now and would like to know if Dr Okey Dupre would accept him back as her patient?

## 2024-03-01 ENCOUNTER — Ambulatory Visit: Payer: Medicare Other

## 2024-04-26 ENCOUNTER — Ambulatory Visit

## 2024-07-12 ENCOUNTER — Ambulatory Visit

## 2024-07-12 VITALS — Ht 66.0 in | Wt 165.0 lb

## 2024-07-12 DIAGNOSIS — Z0000 Encounter for general adult medical examination without abnormal findings: Secondary | ICD-10-CM

## 2024-07-12 DIAGNOSIS — Z1211 Encounter for screening for malignant neoplasm of colon: Secondary | ICD-10-CM | POA: Diagnosis not present

## 2024-07-12 NOTE — Patient Instructions (Signed)
 [name] , Thank you for taking time out of your busy schedule to complete your Annual Wellness Visit with me. I enjoyed our conversation and look forward to speaking with you again next year. I, as well as your care team,  appreciate your ongoing commitment to your health goals. Please review the following plan we discussed and let me know if I can assist you in the future. Your Game plan/ To Do List    Referrals: If you haven't heard from the office you've been referred to, please reach out to them at the phone provided.   [facility] [address] [place] [address] Floor [place],  [place]  [address] Main: [phone]    Follow up Visits: Next Medicare AWV with our clinical staff: 07/18/25 @ 8:40am (PHONE VISIT)   Have you seen your provider in the last 6 months (3 months if uncontrolled diabetes)? Yes Next Office Visit with your provider: 07/26/24 @ 8:20am with Dr. Bernardino Cone  Clinician Recommendations: Keep up the good work!!  Aim for 30 minutes of exercise or brisk walking, 6-8 glasses of water , and 5 servings of fruits and vegetables each day.        This is a list of the screening recommended for you and due dates:  Health Maintenance  Topic Date Due   Yearly kidney health urinalysis for diabetes  Never done   Complete foot exam   07/07/2024   Colon Cancer Screening  07/15/2024   Flu Shot  07/20/2024   Hemoglobin A1C  07/22/2024   Eye exam for diabetics  09/21/2024   Yearly kidney function blood test for diabetes  01/22/2025   Medicare Annual Wellness Visit  07/12/2025   DTaP/Tdap/Td vaccine (4 - Td or Tdap) 01/06/2033   Pneumococcal Vaccine for age over 38  Completed   Hepatitis C Screening  Completed   Zoster (Shingles) Vaccine  Completed   Hepatitis B Vaccine  Aged Out   HPV Vaccine  Aged Out   Meningitis B Vaccine  Aged Out   COVID-19 Vaccine  Discontinued    Advanced directives: (In Chart) A copy of your advanced directives are scanned into your chart  should your provider ever need it. Advance Care Planning is important because it:  [x]  Makes sure you receive the medical care that is consistent with your values, goals, and preferences  [x]  It provides guidance to your family and loved ones and reduces their decisional burden about whether or not they are making the right decisions based on your wishes.  Follow the link provided in your after visit summary or read over the paperwork we have mailed to you to help you started getting your Advance Directives in place. If you need assistance in completing these, please reach out to us  so that we can help you!  See attachments for Preventive Care and Fall Prevention Tips.   Fall Prevention in the Home, Adult Falls can cause injuries and affect people of all ages. There are many simple things that you can do to make your home safe and to help prevent falls. If you need it, ask for help making these changes. What actions can I take to prevent falls? General information Use good lighting in all rooms. Make sure to: Replace any light bulbs that burn out. Turn on lights if it is dark and use night-lights. Keep items that you use often in easy-to-reach places. Lower the shelves around your home if needed. Move furniture so that there are clear paths  around it. Do not keep throw rugs or other things on the floor that can make you trip. If any of your floors are uneven, fix them. Add color or contrast paint or tape to clearly mark and help you see: Grab bars or handrails. First and last steps of staircases. Where the edge of each step is. If you use a ladder or stepladder: Make sure that it is fully opened. Do not climb a closed ladder. Make sure the sides of the ladder are locked in place. Have someone hold the ladder while you use it. Know where your pets are as you move through your home. What can I do in the bathroom?     Keep the floor dry. Clean up any water  that is on the floor right  away. Remove soap buildup in the bathtub or shower. Buildup makes bathtubs and showers slippery. Use non-skid mats or decals on the floor of the bathtub or shower. Attach bath mats securely with double-sided, non-slip rug tape. If you need to sit down while you are in the shower, use a non-slip stool. Install grab bars by the toilet and in the bathtub and shower. Do not use towel bars as grab bars. What can I do in the bedroom? Make sure that you have a light by your bed that is easy to reach. Do not use any sheets or blankets on your bed that hang to the floor. Have a firm bench or chair with side arms that you can use for support when you get dressed. What can I do in the kitchen? Clean up any spills right away. If you need to reach something above you, use a sturdy step stool that has a grab bar. Keep electrical cables out of the way. Do not use floor polish or wax that makes floors slippery. What can I do with my stairs? Do not leave anything on the stairs. Make sure that you have a light switch at the top and the bottom of the stairs. Have them installed if you do not have them. Make sure that there are handrails on both sides of the stairs. Fix handrails that are broken or loose. Make sure that handrails are as long as the staircases. Install non-slip stair treads on all stairs in your home if they do not have carpet. Avoid having throw rugs at the top or bottom of stairs, or secure the rugs with carpet tape to prevent them from moving. Choose a carpet design that does not hide the edge of steps on the stairs. Make sure that carpet is firmly attached to the stairs. Fix any carpet that is loose or worn. What can I do on the outside of my home? Use bright outdoor lighting. Repair the edges of walkways and driveways and fix any cracks. Clear paths of anything that can make you trip, such as tools or rocks. Add color or contrast paint or tape to clearly mark and help you see high doorway  thresholds. Trim any bushes or trees on the main path into your home. Check that handrails are securely fastened and in good repair. Both sides of all steps should have handrails. Install guardrails along the edges of any raised decks or porches. Have leaves, snow, and ice cleared regularly. Use sand, salt, or ice melt on walkways during winter months if you live where there is ice and snow. In the garage, clean up any spills right away, including grease or oil spills. What other actions can I take? Review  your medicines with your health care provider. Some medicines can make you confused or feel dizzy. This can increase your chance of falling. Wear closed-toe shoes that fit well and support your feet. Wear shoes that have rubber soles and low heels. Use a cane, walker, scooter, or crutches that help you move around if needed. Talk with your provider about other ways that you can decrease your risk of falls. This may include seeing a physical therapist to learn to do exercises to improve movement and strength. Where to find more information Centers for Disease Control and Prevention, STEADI: TonerPromos.no [employer] on Aging: BaseRingTones.pl National Institute on Aging: BaseRingTones.pl Contact a health care provider if: You are afraid of falling at home. You feel weak, drowsy, or dizzy at home. You fall at home. Get help right away if you: Lose consciousness or have trouble moving after a fall. Have a fall that causes a head injury. These symptoms may be an emergency. Get help right away. Call 911. Do not wait to see if the symptoms will go away. Do not drive yourself to the hospital. This information is not intended to replace advice given to you by your health care provider. Make sure you discuss any questions you have with your health care provider. Document Revised: 08/09/2022 Document Reviewed: 08/09/2022 Elsevier Patient Education  2024 [employer].

## 2024-07-12 NOTE — Progress Notes (Signed)
 Subjective:   Christian Dodson is a 78 y.o. who presents for a Medicare Wellness preventive visit.  As a reminder, Annual Wellness Visits don't include a physical exam, and some assessments may be limited, especially if this visit is performed virtually. We may recommend an in-person follow-up visit with your provider if needed.  Visit Complete: Virtual I connected with  Christian Dodson on 07/12/24 by a audio enabled telemedicine application and verified that I am speaking with the correct person using two identifiers.  Patient Location: Home  Provider Location: Home Office  I discussed the limitations of evaluation and management by telemedicine. The patient expressed understanding and agreed to proceed.  Vital Signs: Because this visit was a virtual/telehealth visit, some criteria may be missing or patient reported. Any vitals not documented were not able to be obtained and vitals that have been documented are patient reported.  VideoDeclined- This patient declined Librarian, academic. Therefore the visit was completed with audio only.  Persons Participating in Visit: Patient.  AWV Questionnaire: Yes: Patient Medicare AWV questionnaire was completed by the patient on 07/08/24; I have confirmed that all information answered by patient is correct and no changes since this date.  Cardiac Risk Factors include: advanced age (>93men, >64 women);male gender;diabetes mellitus;hypertension;dyslipidemia     Objective:    Today's Vitals   07/12/24 1455  Weight: 165 lb (74.8 kg)  Height: 5' 6 (1.676 m)   Body mass index is 26.63 kg/m.     07/12/2024    3:10 PM 02/22/2023    9:26 AM 01/19/2022    4:30 PM 01/07/2022   10:04 AM 12/19/2021    8:35 AM 05/13/2021   11:24 AM 09/30/2020    3:40 PM  Advanced Directives  Does Patient Have a Medical Advance Directive? Yes Yes Yes Yes No Yes Yes  Type of Estate agent of [place];Living will  Healthcare Power of [place];Living will Healthcare Power of [place];Living will Healthcare Power of [place];Living will   Healthcare Power of [place];Living will  Does patient want to make changes to medical advance directive? No - Patient declined No - Patient declined No - Patient declined   No - Patient declined No - Patient declined  Copy of Healthcare Power of Attorney in Chart? Yes - validated most recent copy scanned in chart (See row information) Yes - validated most recent copy scanned in chart (See row information) No - copy requested No - copy requested   Yes - validated most recent copy scanned in chart (See row information)  Would patient like information on creating a medical advance directive?   No - Patient declined  No - Patient declined      Current Medications (verified) Outpatient Encounter Medications as of 07/12/2024  Medication Sig   amoxicillin  (AMOXIL ) 500 MG capsule For dental procedures.   Ascorbic Acid (VITAMIN C PO) Take 1 tablet by mouth every morning.   aspirin  EC 81 MG tablet Take 81 mg by mouth daily. Swallow whole.   cetirizine (ZYRTEC) 10 MG tablet Take 10 mg by mouth every morning.   ciclopirox  (LOPROX ) 0.77 % cream Apply topically 2 (two) times daily.   clobetasol cream (TEMOVATE) 0.05 % Apply topically 2 (two) times daily. (Patient taking differently: Apply topically 2 (two) times daily. PRN)   Clotrimazole  1 % OINT Apply 1 Application topically 2 (two) times a week.   finasteride  (PROSCAR ) 5 MG tablet Take 1 tablet (5 mg total) by mouth at bedtime.  GLUCOSAMINE-CHONDROITIN PO Take 1 tablet by mouth every morning.   hydrochlorothiazide  (HYDRODIURIL ) 25 MG tablet TAKE 1 TABLET BY MOUTH DAILY   lisinopril  (ZESTRIL ) 40 MG tablet TAKE 1 TABLET BY MOUTH DAILY   Multiple Vitamins-Minerals (ZINC PO) Take 1 tablet by mouth every morning.   omeprazole  (PRILOSEC) 10 MG capsule Take 1 capsule (10 mg total) by mouth daily.   omeprazole  (PRILOSEC) 20 MG capsule Take  20 mg by mouth daily.   Probiotic Product (PROBIOTIC DAILY PO) Take 1 tablet by mouth daily.   simvastatin  (ZOCOR ) 20 MG tablet TAKE 1 TABLET BY MOUTH AT  BEDTIME   VITAMIN D PO Take 1 tablet by mouth every morning.   Urea  40 % LOTN Apply 1 Application topically daily. To help toenail antifungals work (Patient not taking: Reported on 07/12/2024)   [DISCONTINUED] benzonatate  (TESSALON ) 100 MG capsule Take 1 capsule (100 mg total) by mouth every 8 (eight) hours.   No facility-administered encounter medications on file as of 07/12/2024.    Allergies (verified) Patient has no known allergies.   History: Past Medical History:  Diagnosis Date   Arthritis    BPH (benign prostatic hyperplasia) 10/19/2013   GERD (gastroesophageal reflux disease)    History of cholecystectomy 01/06/2023   12/21/2021, for gallstones/pain/dilated cbd     History of stomach ulcers    age 5   Hypertension    Pneumonia    x4   Pre-diabetes    Seasonal allergies 01/06/2023   Skin cancer    Ulcer    Had when I was 6 year's old   Past Surgical History:  Procedure Laterality Date   APPENDECTOMY     CHOLECYSTECTOMY N/A 12/21/2021   Procedure: LAPAROSCOPIC CHOLECYSTECTOMY;  Surgeon: Dasie Leonor CROME, MD;  Location: [facility];  Service: General;  Laterality: N/A;   COLONOSCOPY  2010   [place] =normal exam per pt   ERCP N/A 12/20/2021   Procedure: ENDOSCOPIC RETROGRADE CHOLANGIOPANCREATOGRAPHY (ERCP);  Surgeon: Aneita Gwendlyn DASEN, MD;  Location: [facility];  Service: Endoscopy;  Laterality: N/A;   EYE SURGERY  2023   Cataracts   INGUINAL HERNIA REPAIR Right    JOINT REPLACEMENT  2013, 2021 & 2023   Both hips and right knee   left shoulder rotator and bicep  01/10/2017   MEDIAL PARTIAL KNEE REPLACEMENT Right 2013   REMOVAL OF STONES  12/20/2021   Procedure: REMOVAL OF STONES;  Surgeon: Aneita Gwendlyn DASEN, MD;  Location: [facility];  Service: Endoscopy;;   SPHINCTEROTOMY  12/20/2021   Procedure: ANNETT;   Surgeon: Aneita Gwendlyn DASEN, MD;  Location: [facility];  Service: Endoscopy;;   TONSILLECTOMY AND ADENOIDECTOMY     TOTAL HIP ARTHROPLASTY Left 09/30/2020   Procedure: TOTAL HIP ARTHROPLASTY ANTERIOR APPROACH;  Surgeon: Ernie Cough, MD;  Location: [facility];  Service: Orthopedics;  Laterality: Left;  70 mins   TOTAL HIP ARTHROPLASTY Right 01/19/2022   Procedure: TOTAL HIP ARTHROPLASTY ANTERIOR APPROACH;  Surgeon: Ernie Cough, MD;  Location: [facility];  Service: Orthopedics;  Laterality: Right;   UPPER GI ENDOSCOPY     Family History  Problem Relation Age of Onset   Colon polyps Mother    Hypertension Mother    COPD Mother    Arthritis Mother    Cancer Father        lung - smoker   Hyperlipidemia Maternal Grandfather    Heart disease Maternal Grandfather    Stroke Maternal Grandfather    Stroke Paternal Grandmother    Heart disease Paternal  Grandmother    Hyperlipidemia Paternal Grandfather    Diabetes Neg Hx    Colon cancer Neg Hx    Esophageal cancer Neg Hx    Rectal cancer Neg Hx    Stomach cancer Neg Hx    Social History   Socioeconomic History   Marital status: Married    Spouse name: Inocente   Number of children: 2   Years of education: Not on file   Highest education level: Associate degree: occupational, Scientist, product/process development, or vocational program  Occupational History   Occupation: retired  Tobacco Use   Smoking status: Never   Smokeless tobacco: Never  Vaping Use   Vaping status: Never Used  Substance and Sexual Activity   Alcohol use: Yes    Alcohol/week: 1.0 standard drink of alcohol    Types: 1 Cans of beer per week    Comment: 1-2 drinks monthly or less   Drug use: No   Sexual activity: Yes  Other Topics Concern   Not on file  Social History Narrative   Not on file   Social Drivers of Health   Financial Resource Strain: Low Risk  (07/12/2024)   Overall Financial Resource Strain (CARDIA)    Difficulty of Paying Living Expenses: Not hard at all  Food Insecurity:  No Food Insecurity (07/12/2024)   Hunger Vital Sign    Worried About Running Out of Food in the Last Year: Never true    Ran Out of Food in the Last Year: Never true  Transportation Needs: No Transportation Needs (07/12/2024)   PRAPARE - Administrator, Civil Service (Medical): No    Lack of Transportation (Non-Medical): No  Physical Activity: Insufficiently Active (07/12/2024)   Exercise Vital Sign    Days of Exercise per Week: 3 days    Minutes of Exercise per Session: 30 min  Stress: No Stress Concern Present (07/12/2024)   [employer] of Occupational Health - Occupational Stress Questionnaire    Feeling of Stress: Not at all  Social Connections: Moderately Isolated (07/12/2024)   Social Connection and Isolation Panel    Frequency of Communication with Friends and Family: Twice a week    Frequency of Social Gatherings with Friends and Family: More than three times a week    Attends Religious Services: Never    Database administrator or Organizations: No    Attends Engineer, structural: Never    Marital Status: Married    Tobacco Counseling Counseling given: Not Answered    Clinical Intake:  Pre-visit preparation completed: Yes  Pain : No/denies pain     BMI - recorded: 26.63 Nutritional Status: BMI 25 -29 Overweight Nutritional Risks: None Diabetes: Yes CBG done?: No Did pt. bring in CBG monitor from home?: No  Lab Results  Component Value Date   HGBA1C 6.9 (H) 01/23/2024   HGBA1C 6.6 (H) 07/12/2023   HGBA1C 7.0 (H) 01/06/2023     How often do you need to have someone help you when you read instructions, pamphlets, or other written materials from your doctor or pharmacy?: 1 - Never  Interpreter Needed?: No  Information entered by :: Vina Ned, CMA   Activities of Daily Living     07/12/2024    2:57 PM 07/08/2024    9:16 AM  In your present state of health, do you have any difficulty performing the following activities:   Hearing? 1 1  Comment wears hearing aids   Vision? 0 0  Difficulty concentrating or making decisions?  0 0  Walking or climbing stairs? 0 0  Dressing or bathing? 0 0  Doing errands, shopping? 0 0  Preparing Food and eating ? N N  Using the Toilet? N N  In the past six months, have you accidently leaked urine? N N  Do you have problems with loss of bowel control? N N  Managing your Medications? N N  Managing your Finances? N N  Housekeeping or managing your Housekeeping? N N    Patient Care Team: Jesus Bernardino MATSU, MD as PCP - General (Internal Medicine) Cam Morene ORN, MD as Attending Physician (Urology) Waylan Cain, MD as Consulting Physician (Ophthalmology) Albertus, Gordy HERO, MD as Consulting Physician (Gastroenterology)  I have updated your Care Teams any recent Medical Services you may have received from other providers in the past year.     Assessment:   This is a routine wellness examination for [facility].  Hearing/Vision screen Hearing Screening - Comments:: Wears hearing aids Vision Screening - Comments:: Gets DM eye exams, Dr Waylan, [place], [place]   Goals Addressed               This Visit's Progress     Weight (lb) < 160 lb (72.6 kg) (pt-stated)   165 lb (74.8 kg)     COMPLETED: Weight < 180 lb (81.6 kg)   165 lb (74.8 kg)     Will start walking again; 5 days as tolerated;         Depression Screen     07/12/2024    3:08 PM 01/23/2024    8:14 AM 07/08/2023    8:21 AM 02/22/2023    9:27 AM 01/06/2023    8:10 AM 05/03/2022    7:55 AM 05/13/2021   11:23 AM  PHQ 2/9 Scores  PHQ - 2 Score 0 0 0 0 0 0 0  PHQ- 9 Score 0     0     Fall Risk     07/12/2024    3:11 PM 07/08/2024    9:16 AM 01/23/2024    8:14 AM 07/08/2023    8:21 AM 02/21/2023    2:56 PM  Fall Risk   Falls in the past year? 0 0 0 0 0  Number falls in past yr: 0  0 0 0  Injury with Fall? 0  0 0 0  Risk for fall due to : No Fall Risks  No Fall Risks No Fall Risks Impaired vision  Follow up  Falls evaluation completed  Falls evaluation completed Falls evaluation completed Falls prevention discussed    MEDICARE RISK AT HOME:  Medicare Risk at Home Any stairs in or around the home?: Yes If so, are there any without handrails?: No Home free of loose throw rugs in walkways, pet beds, electrical cords, etc?: Yes Adequate lighting in your home to reduce risk of falls?: Yes Life alert?: No Use of a cane, walker or w/c?: No Grab bars in the bathroom?: No Shower chair or bench in shower?: No Elevated toilet seat or a handicapped toilet?: No  TIMED UP AND GO:  Was the test performed?  No  Cognitive Function: 6CIT completed    04/17/2018    9:24 AM 04/17/2018    8:22 AM  MMSE - Mini Mental State Exam  Not completed: -- Refused        07/12/2024    3:12 PM 02/22/2023    9:30 AM  6CIT Screen  What Year? 0 points 0 points  What month? 0  points 0 points  What time? 0 points 0 points  Count back from 20 0 points 0 points  Months in reverse 0 points 0 points  Repeat phrase 0 points 0 points  Total Score 0 points 0 points    Immunizations Immunization History  Administered Date(s) Administered   DT (Pediatric) 12/21/2011   Fluad Quad(high Dose 65+) 09/19/2020, 09/08/2021, 10/01/2022   Influenza, High Dose Seasonal PF 09/19/2013, 10/10/2016, 10/02/2018, 09/20/2019, 10/01/2022, 09/07/2023   Influenza,inj,Quad PF,6+ Mos 09/03/2014   Influenza-Unspecified 08/21/2015, 10/03/2017, 09/20/2019, 09/17/2021   PFIZER(Purple Top)SARS-COV-2 Vaccination 01/24/2020, 02/14/2020, 11/12/2020   PNEUMOCOCCAL CONJUGATE-20 01/23/2024   Pneumococcal Conjugate-13 03/24/2016   Pneumococcal Polysaccharide-23 03/20/2013   Tdap 10/03/2012, 01/06/2023   Zoster Recombinant(Shingrix) 10/31/2018, 01/30/2019   Zoster, Live 09/03/2014, 09/17/2014    Screening Tests Health Maintenance  Topic Date Due   Diabetic kidney evaluation - Urine ACR  Never done   FOOT EXAM  07/07/2024   Colonoscopy   07/15/2024   INFLUENZA VACCINE  07/20/2024   HEMOGLOBIN A1C  07/22/2024   OPHTHALMOLOGY EXAM  09/21/2024   Diabetic kidney evaluation - eGFR measurement  01/22/2025   Medicare Annual Wellness (AWV)  07/12/2025   DTaP/Tdap/Td (4 - Td or Tdap) 01/06/2033   Pneumococcal Vaccine: 50+ Years  Completed   Hepatitis C Screening  Completed   Zoster Vaccines- Shingrix  Completed   Hepatitis B Vaccines  Aged Out   HPV VACCINES  Aged Out   Meningococcal B Vaccine  Aged Out   COVID-19 Vaccine  Discontinued    Health Maintenance  Health Maintenance Due  Topic Date Due   Diabetic kidney evaluation - Urine ACR  Never done   FOOT EXAM  07/07/2024   Colonoscopy  07/15/2024   Health Maintenance Items Addressed: Diabetic Foot Exam recommended, See Nurse Notes at the end of this note  Additional Screening:  Vision Screening: Recommended annual ophthalmology exams for early detection of glaucoma and other disorders of the eye. Would you like a referral to an eye doctor? No    Dental Screening: Recommended annual dental exams for proper oral hygiene  Community Resource Referral / Chronic Care Management: CRR required this visit?  No   CCM required this visit?  No   Plan:    I have personally reviewed and noted the following in the patient's chart:   Medical and social history Use of alcohol, tobacco or illicit drugs  Current medications and supplements including opioid prescriptions. Patient is not currently taking opioid prescriptions. Functional ability and status Nutritional status Physical activity Advanced directives List of other physicians Hospitalizations, surgeries, and ER visits in previous 12 months Vitals Screenings to include cognitive, depression, and falls Referrals and appointments  In addition, I have reviewed and discussed with patient certain preventive protocols, quality metrics, and best practice recommendations. A written personalized care plan for preventive  services as well as general preventive health recommendations were provided to patient.   Vina Ned, CMA   07/12/2024   After Visit Summary: (MyChart) Due to this being a telephonic visit, the after visit summary with patients personalized plan was offered to patient via MyChart   Notes:  Placed referral to [facility] for colonoscopy due ~ 07/15/24 Declined covid vaccine

## 2024-07-26 ENCOUNTER — Encounter: Payer: Self-pay | Admitting: Internal Medicine

## 2024-07-26 ENCOUNTER — Ambulatory Visit: Payer: Medicare Other | Admitting: Internal Medicine

## 2024-07-26 VITALS — BP 100/68 | HR 65 | Temp 98.0°F | Ht 66.0 in | Wt 165.4 lb

## 2024-07-26 DIAGNOSIS — B351 Tinea unguium: Secondary | ICD-10-CM

## 2024-07-26 DIAGNOSIS — Z125 Encounter for screening for malignant neoplasm of prostate: Secondary | ICD-10-CM

## 2024-07-26 DIAGNOSIS — Z0000 Encounter for general adult medical examination without abnormal findings: Secondary | ICD-10-CM | POA: Diagnosis not present

## 2024-07-26 DIAGNOSIS — E1169 Type 2 diabetes mellitus with other specified complication: Secondary | ICD-10-CM

## 2024-07-26 DIAGNOSIS — E782 Mixed hyperlipidemia: Secondary | ICD-10-CM | POA: Diagnosis not present

## 2024-07-26 DIAGNOSIS — Z0001 Encounter for general adult medical examination with abnormal findings: Secondary | ICD-10-CM

## 2024-07-26 DIAGNOSIS — R350 Frequency of micturition: Secondary | ICD-10-CM

## 2024-07-26 DIAGNOSIS — E663 Overweight: Secondary | ICD-10-CM

## 2024-07-26 DIAGNOSIS — N401 Enlarged prostate with lower urinary tract symptoms: Secondary | ICD-10-CM

## 2024-07-26 DIAGNOSIS — I10 Essential (primary) hypertension: Secondary | ICD-10-CM | POA: Diagnosis not present

## 2024-07-26 LAB — LIPID PANEL
Cholesterol: 120 mg/dL (ref 0–200)
HDL: 36.4 mg/dL — ABNORMAL LOW (ref >39.00)
LDL Cholesterol: 49 mg/dL (ref 0–99)
NonHDL: 83.32
Total CHOL/HDL Ratio: 3
Triglycerides: 171 mg/dL — ABNORMAL HIGH (ref 0.0–149.0)
VLDL: 34.2 mg/dL (ref 0.0–40.0)

## 2024-07-26 LAB — COMPREHENSIVE METABOLIC PANEL WITH GFR
ALT: 18 U/L (ref 0–53)
AST: 12 U/L (ref 0–37)
Albumin: 4.7 g/dL (ref 3.5–5.2)
Alkaline Phosphatase: 71 U/L (ref 39–117)
BUN: 19 mg/dL (ref 6–23)
CO2: 25 meq/L (ref 19–32)
Calcium: 10.4 mg/dL (ref 8.4–10.5)
Chloride: 99 meq/L (ref 96–112)
Creatinine, Ser: 1.02 mg/dL (ref 0.40–1.50)
GFR: 71.63 mL/min (ref >60.00)
Glucose, Bld: 156 mg/dL — ABNORMAL HIGH (ref 70–99)
Potassium: 4 meq/L (ref 3.5–5.1)
Sodium: 140 meq/L (ref 135–145)
Total Bilirubin: 0.6 mg/dL (ref 0.2–1.2)
Total Protein: 7.4 g/dL (ref 6.0–8.3)

## 2024-07-26 LAB — CBC WITH DIFFERENTIAL/PLATELET
Basophils Absolute: 0 K/uL (ref 0.0–0.1)
Basophils Relative: 0.2 % (ref 0.0–3.0)
Eosinophils Absolute: 0.2 K/uL (ref 0.0–0.7)
Eosinophils Relative: 3.6 % (ref 0.0–5.0)
HCT: 43.1 % (ref 39.0–52.0)
Hemoglobin: 14.6 g/dL (ref 13.0–17.0)
Lymphocytes Relative: 38.8 % (ref 12.0–46.0)
Lymphs Abs: 2.5 K/uL (ref 0.7–4.0)
MCHC: 34 g/dL (ref 30.0–36.0)
MCV: 89.7 fl (ref 78.0–100.0)
Monocytes Absolute: 0.5 K/uL (ref 0.1–1.0)
Monocytes Relative: 7.1 % (ref 3.0–12.0)
Neutro Abs: 3.2 K/uL (ref 1.4–7.7)
Neutrophils Relative %: 50.3 % (ref 43.0–77.0)
Platelets: 257 K/uL (ref 150.0–400.0)
RBC: 4.8 Mil/uL (ref 4.22–5.81)
RDW: 13.9 % (ref 11.5–15.5)
WBC: 6.4 K/uL (ref 4.0–10.5)

## 2024-07-26 LAB — HEMOGLOBIN A1C: Hgb A1c MFr Bld: 7.1 % — ABNORMAL HIGH (ref 4.6–6.5)

## 2024-07-26 LAB — PSA: PSA: 2.59 ng/mL (ref 0.10–4.00)

## 2024-07-26 NOTE — Assessment & Plan Note (Signed)
 Primary hypertension well-controlled with lisinopril  and hydrochlorothiazide . Recent low blood pressure readings suggest potential over-treatment. Discontinue hydrochlorothiazide  and monitor blood pressure at home. Resume hydrochlorothiazide  if blood pressure exceeds 130/80 mmHg.

## 2024-07-26 NOTE — Assessment & Plan Note (Signed)
 Onychomycosis showing improvement with topical treatment. Persistent mild infection on big toe with previous adverse reaction to oral antifungal medication. Continue topical antifungal cream as maintenance therapy. Consider referral to podiatrist if condition worsens.

## 2024-07-26 NOTE — Patient Instructions (Addendum)
 Try switching to riced cauliflower.    Try ratio yogurt    ?? Trans Fats: What You Need to Know (and How to Avoid Them) Protect Your Heart, Brain, and Overall Health  ? What Are Trans Fats? Trans fats are a type of unhealthy fat that can increase your risk of: Heart disease Stroke Type 2 diabetes Inflammation Memory problems They are artificially made through a process called hydrogenation and were once common in processed foods for better shelf life and texture.  ?? Why Should I Avoid Trans Fats? Even small amounts of trans fats can: Raise "bad" LDL cholesterol Lower "good" HDL cholesterol Cause inflammation in your blood vessels Increase your risk of heart attack or stroke There is no safe level of artificial trans fat.  ?? How to Spot Trans Fats (Even When the Label Says "0g") Food companies can legally say "0 grams trans fat" if the product contains less than 0.5 grams per serving -- but that can add up fast! Look at the ingredients list for these clues: ?? Partially hydrogenated oil ? this means trans fat is present. ? Avoid foods with "shortening" or "hydrogenated" oils.  ?? Common Foods That May Contain Trans Fats Even today, you may find trans fats in: Baked goods (cookies, cakes, pies) Microwave popcorn Crackers Margarine and shortening Fried fast foods Frozen pizza Peanut butters  ? Healthier Choices Choose products with 0g trans fat and no "partially hydrogenated oil" in the ingredients. Use olive oil, avocado oil, or canola oil for cooking. Eat more whole, unprocessed foods: fruits, vegetables, whole grains, and lean proteins. Choose baked over fried, and fresh over packaged.  ?? Takeaway Message Trans fats are harmful, even in small amounts. To protect your health: Read labels carefully. Look beyond "0g trans fat" and scan for "partially hydrogenated oils." Choose whole foods and heart-healthy  fats.      [email or web address] [facility] [address] [place], [place] [address] Phone:[phone]     VISIT SUMMARY: You had a routine follow-up and annual physical exam today. We discussed your current health status, including your hypertension, borderline diabetes, and other health concerns. We also reviewed your diet, exercise routine, and preventive care measures.  YOUR PLAN: -ADULT WELLNESS VISIT: This visit focused on preventive care and lifestyle changes. We performed a physical examination and reviewed preventive care, including colon cancer screening and skin cancer surveillance. We also discussed diet and lifestyle modifications to maintain your overall health.  -TYPE 2 DIABETES MELLITUS: Type 2 diabetes is a condition where your body does not use insulin properly, leading to high blood sugar levels. Your diabetes is borderline controlled with a previous A1c around 7. We ordered an A1c test and discussed dietary changes to reduce carbohydrate intake. If your A1c is over 7, we may consider starting you on metformin. We also ordered a kidney screening and performed a foot examination to check for complications.  -PRIMARY HYPERTENSION: Hypertension is high blood pressure. Your blood pressure is well-controlled with lisinopril  and hydrochlorothiazide , but recent low readings suggest you might be over-treated. We decided to discontinue hydrochlorothiazide  for now and ask you to monitor your blood pressure at home. If your blood pressure goes above 130/80 mmHg, you should resume taking hydrochlorothiazide .  -MIXED HYPERLIPIDEMIA: Mixed hyperlipidemia is a condition with low HDL (good cholesterol) and high triglycerides (a type of fat in the blood). We emphasized dietary changes to improve your lipid profile and ordered a lipid panel to check your current levels. We discussed reducing carbohydrate intake and  increasing healthy fats in your diet.  -ONYCHOMYCOSIS: Onychomycosis is a fungal infection of the nails. Your condition has improved with topical treatment, but there is still a mild infection on your big toe. Continue using the topical antifungal cream as maintenance therapy. If the condition worsens, we may refer you to a podiatrist.  INSTRUCTIONS: Please monitor your blood pressure at home and resume hydrochlorothiazide  if it exceeds 130/80 mmHg. Follow up with the ordered A1c test, kidney screening, and lipid panel. Continue using the topical antifungal cream for your nail fungus. If you notice any worsening of your nail condition, let us  know. Maintain your current diet and exercise routine, and consider the dietary changes we discussed to help manage your diabetes and lipid levels. Schedule your next annual physical and any recommended screenings.  Building Your Long-Term Health Plan  During today's preventive visit, we covered a variety of important health checks to help you stay on top of your well-being.  We also discussed strategies to maintain your health and identified some areas that might benefit from further exploration.   Preventive care visits like today's are designed to be proactive, but sometimes additional attention may be needed.  Rest assured, we're here for you.  If these areas require further evaluation or management, we'd be happy to schedule a separate, focused appointment to address them in detail.  Addressing Next Steps  [x]   Follow-up Visit: To ensure we address any unresolved issues and continue monitoring your overall health, we recommend scheduling a follow-up appointment in 1 year for your next preventive care visit. If you experience any new problems, need to discuss any medical concerns, or your condition worsens before then, please don't hesitate to call our office to schedule an appointment or seek emergency care as needed.  [x]   Preventive Measures: Maintaining  healthy habits plays a crucial role in overall wellness. We recommend considering these tips: [x]   Regular appointments with dental and vision professionals [x]   Nightly nasal saline mist to keep sinuses clear [x]   Consistent toothbrushing to maintain oral health [x]   Using an app like SnoreLab to track sleep quality [x]   Routine checks of blood pressure and heart rate [x]   Medical Information: In some instances, we may require additional medical information from other providers to create a comprehensive picture of your health. If applicable, we can provide a medical information release form at the front desk for you to sign, allowing us  to gather these records. [x]   Lab Tests: If any lab tests were ordered today, scheduling them within a week of your visit helps ensure the best possible insurance coverage.  Planning Follow Up to Work on a Problem? Make the Most of Our Focused (20 minute) Appointments  [x]   Clearly state your top concerns at the beginning of the visit to focus our discussion [x]   If you anticipate you will need more time, please inform the front desk during scheduling - we can book multiple appointments in the same week. [x]   If you have transportation problems- use our convenient video appointments or ask about transportation support. [x]   We can get down to business faster if you use MyChart to update information before the visit and submit non-urgent questions before your visit. Thank you for taking the time to provide details through MyChart.  Let our nurse know and she can import this information into your encounter documents.  Arrival and Wait Times  [x]   Arriving on time ensures that everyone receives prompt attention. [x]   Early morning (  8a) and afternoon (1p) appointments tend to have shortest wait times. [x]   Unfortunately, we cannot delay appointments for late arrivals or hold slots during phone calls.  Bring to Your Next Appointment:  [x]   Medications: Please  bring all your medication bottles to your next appointment to ensure we have an accurate record of your prescriptions. [x]   Health Diaries: If you're monitoring any health conditions at home, keeping a diary of your readings can be very helpful for discussions at your next appointment.  Reviewing Your Records  [x]   Review your attached preventive care information at the end of these patient instructions. [x]   Review this early draft of your clinical encounter notes below and the final encounter summary tomorrow on MyChart after its been completed.      Getting Answers and Following Up  [x]   Simple Questions & Concerns: For quick questions or basic follow-up after your visit, reach us  at [phone] or MyChart messaging. [x]   Complex Concerns: If your concern is more complex, scheduling an appointment might be best. Discuss this with the staff to find the most suitable option. [x]   Lab & Imaging Results: We'll contact you directly if results are abnormal or you don't use MyChart. Most normal results will be on MyChart within 2-3 business days, with a review message from Dr. Jesus. Haven't heard back in 2 weeks? Need results sooner? Contact us  at [phone]. [x]   Referrals: Our referral coordinator will manage specialist referrals. The specialist's office should contact you within 2 weeks to schedule an appointment. Call us  if you haven't heard from them after 2 weeks.  Staying Connected  [x]   MyChart: Activate your MyChart for the fastest way to access results and message us . See the last page of this paperwork for instructions on how to activate.  Billing  [x]   X-ray & Lab Orders: These are billed by separate companies. Contact the invoicing company directly for questions or concerns. [x]   Visit Charges: Discuss any billing inquiries with our administrative services team.  Your Satisfaction Matters  [x]   Share Your Experience: We strive for your satisfaction! If you have any  complaints, or preferably compliments, please let Dr. Jesus know directly or contact our Practice Administrators, Manuelita Rubin or [employer], by asking at the front desk.                 Next Steps  [x]   Schedule Follow-Up:  We recommend a follow-up appointment in 1 year for your next wellness visit.  If you develop any new problems, want to address any medical issues, or your condition worsens before then, please call us  for an appointment or seek emergency care. [x]   Preventive Care:  Make sure to keep regular appointments with dental and vision professionals, use nightly nasal saline mist sprays to keep your sinuses clear and toothbrushing to protect your teeth. Use SnoreLab App or other app to track your sleep quality. Check blood pressure and heart rate routinely. [x]   Medical Information Release:  For any relevant medical information we don't have, please sign a release form at the front desk so we can obtain it for your records. [x]   Lab Tests:  Schedule any lab tests from today for within a week to ensure best insurance coverage.    Making the Most of Our Focused (20 minute) Appointments:  [x]   Clearly state your top concerns at the beginning of the visit to focus our discussion [x]   If you anticipate you will need more time,  please inform the front desk during scheduling - we can book multiple appointments in the same week. [x]   If you have transportation problems- use our convenient video appointments or ask about transportation support. [x]   We can get down to business faster if you use MyChart to update information before the visit and submit non-urgent questions before your visit. Thank you for taking the time to provide details through MyChart.  Let our nurse know and she can import this information into your encounter documents.  Arrival and Wait Times: [x]   Arriving on time ensures that everyone receives prompt attention. [x]   Early morning (8a) and afternoon  (1p) appointments tend to have shortest wait times. [x]   Unfortunately, we cannot delay appointments for late arrivals or hold slots during phone calls.  Bring to Your Next Appointment  [x]   Medications: Please bring all your medication bottles to your next appointment to ensure we have an accurate record of your prescriptions. [x]   Health Diaries: If you're monitoring any health conditions at home, keeping a diary of your readings can be very helpful for discussions at your next appointment.  Reviewing Your Records  [x]   Review your attached preventive care information at the end of these patient instructions. [x]   Review this early draft of your clinical encounter notes below and the final encounter summary tomorrow on MyChart after its been completed.   Encounter for annual general medical examination with abnormal findings in adult -     Lipid panel -     Comprehensive metabolic panel with GFR -     CBC with Differential/Platelet -     TSH Rfx on Abnormal to Free T4 -     Hemoglobin A1c -     PSA -     Protein / creatinine ratio, urine  Mixed hyperlipidemia  Controlled type 2 diabetes mellitus with other specified complication, without long-term current use of insulin (HCC) -     Lipid panel -     Comprehensive metabolic panel with GFR -     CBC with Differential/Platelet -     Hemoglobin A1c  Primary hypertension  Overweight -     TSH Rfx on Abnormal to Free T4  Benign prostatic hyperplasia with urinary frequency  Screening examination for infectious disease  Screening for malignant neoplasm of prostate -     PSA     Getting Answers and Following Up  [x]   Simple Questions & Concerns: For quick questions or basic follow-up after your visit, reach us  at [phone] or MyChart messaging. [x]   Complex Concerns: If your concern is more complex, scheduling an appointment might be best. Discuss this with the staff to find the most suitable option. [x]   Lab & Imaging  Results: We'll contact you directly if results are abnormal or you don't use MyChart. Most normal results will be on MyChart within 2-3 business days, with a review message from Dr. Jesus. Haven't heard back in 2 weeks? Need results sooner? Contact us  at [phone]. [x]   Referrals: Our referral coordinator will manage specialist referrals. The specialist's office should contact you within 2 weeks to schedule an appointment. Call us  if you haven't heard from them after 2 weeks.  Staying Connected  [x]   MyChart: Activate your MyChart for the fastest way to access results and message us . See the last page of this paperwork for instructions on how to activate.  Billing  [x]   X-ray & Lab Orders: These are billed by separate companies. Contact the  invoicing company directly for questions or concerns. [x]   Visit Charges: Discuss any billing inquiries with our administrative services team.  Your Satisfaction Matters  [x]   Share Your Experience: We strive for your satisfaction! If you have any complaints, or preferably compliments, please let Dr. Jesus know directly or contact our Practice Administrators, Manuelita Rubin or [employer], by asking at the front desk.    Medical Screening Exam A medical screening exam (MSE) helps to determine whether you need immediate medical treatment relating to any number of symptoms you are having. This type of exam may be done in an emergency department, an urgent care setting, or your health care provider's office. Depending on your symptoms and severity, you may need additional tests or medical therapy. It is important to note that an MSE does not necessarily mean that you will need or receive further medical testing or interventions if your symptoms are not deemed to be medically urgent (emergent). Tell a health care provider about: Any allergies you have. All medicines you are taking, including vitamins, herbs, eye drops, creams, and over-the-counter  medicines. Any problems you or family members have had with anesthetic medicines. Any bleeding problems you have. Any surgeries you have had. Any medical conditions you have. Whether you are pregnant or may be pregnant. What happens during the test? During the exam, a health care provider does a short, often focused, physical exam and asks about your medical history to assess: Your current symptoms. Your overall health. Your need for possible further medical intervention. What can I expect after the test? If you have a regular health care provider, make an appointment for a follow-up visit with him or her. If you do not have a regular health care provider, ask about resources in your community. Your medical screening exam may determine that: You do not need emergency treatment at this time. You need treatment right away. You need to be transferred to another medical center. This may happen if you need an emergent specialist or consultant that is not available at the medical center you are at. You need to have more tests. A medical specialist may be consulted if needed. Get help right away if: Your condition gets worse. You develop new or troubling symptoms before you see your health care provider. These symptoms may represent a serious problem that is an emergency. Do not wait to see if the symptoms will go away. Get medical help right away. Call your local emergency services (911 in the [place] Do not drive yourself to the hospital. Summary A medical screening exam helps to determine whether you need medical treatment right away. This type of exam may be done in an emergency department, an urgent care setting, or your health care provider's office. During the exam, a health care provider does a short physical exam and asks about your current symptoms and overall health. Depending on the exam, more tests or therapies may be ordered. However, an MSE does not necessarily mean that you will  have further medical testing if your symptoms are not deemed to be urgent. If you need further care that is not offered at your current medical center, you may need to be transferred to another facility. This information is not intended to replace advice given to you by your health care provider. Make sure you discuss any questions you have with your health care provider. Document Revised: 08/19/2021 Document Reviewed: 04/16/2021 Elsevier Patient Education  2024 [employer].

## 2024-07-26 NOTE — Assessment & Plan Note (Signed)
 Mild. Encouraged weight loss exercise

## 2024-07-26 NOTE — Progress Notes (Signed)
 [facility] at [facility] [address] [place], [place] [address] Office:  [phone]  -- Annual Preventive Medical Office Visit --  Patient:  Christian Dodson      Age: 78 y.o.       Sex:  male  Date:   07/26/2024 Patient Care Team: Jesus Bernardino MATSU, MD as PCP - General (Internal Medicine) Cam Morene ORN, MD as Attending Physician (Urology) Waylan Cain, MD as Consulting Physician (Ophthalmology) Pyrtle, Gordy HERO, MD as Consulting Physician (Gastroenterology) Ernie Cough, MD as Consulting Physician (Orthopedic Surgery) Pyrtle, Gordy HERO, MD as Consulting Physician (Gastroenterology) Today's Healthcare Provider: Bernardino MATSU Jesus, MD  ========================================= Chief complaint: Hypertension  Purpose of Visit: Comprehensive preventive health assessment and personalized health maintenance planning.  This encounter was conducted as a Comprehensive Physical Exam (CPE) preventive care annual visit. The patient's medical history and problem list were reviewed to inform individualized preventive care recommendations.   We also managed diabetes and hypertension at this visit, in addition to Comprehensive Physical Exam (CPE) preventive care annual visit.  Assessment & Plan Encounter for annual general medical examination with abnormal findings in adult Routine adult wellness visit focused on preventive care and lifestyle changes. Verify eligibility for annual physical with insurance. Perform physical examination and review preventive care, including colon cancer screening and skin cancer surveillance. Discuss diet and lifestyle modifications. Mixed hyperlipidemia Mixed hyperlipidemia with previous low HDL and high triglycerides. Emphasize dietary modifications to improve lipid profile. Order lipid panel and discuss dietary changes to reduce carbohydrate intake and increase healthy fats. Controlled type 2 diabetes mellitus with other specified complication,  without long-term current use of insulin (HCC) Borderline controlled Type 2 diabetes mellitus with previous A1c around 7. He prefers to avoid injectable medications. Order A1c test and discuss dietary modifications to reduce carbohydrate intake. Consider metformin if A1c is over 7. Order kidney screening for proteinuria and perform foot examination for diabetic complications. Primary hypertension Primary hypertension well-controlled with lisinopril  and hydrochlorothiazide . Recent low blood pressure readings suggest potential over-treatment. Discontinue hydrochlorothiazide  and monitor blood pressure at home. Resume hydrochlorothiazide  if blood pressure exceeds 130/80 mmHg. Overweight Mild. Encouraged weight loss exercise Screening for malignant neoplasm of prostate Benign prostatic hyperplasia with urinary frequency Following with urology share PSA after shared decision making he will get done here Onychomycosis Onychomycosis showing improvement with topical treatment. Persistent mild infection on big toe with previous adverse reaction to oral antifungal medication. Continue topical antifungal cream as maintenance therapy. Consider referral to podiatrist if condition worsens.   Reviewed/updated/encouraged completion: Immunization History  Administered Date(s) Administered   DT (Pediatric) 12/21/2011   Fluad Quad(high Dose 65+) 09/19/2020, 09/08/2021, 10/01/2022   Influenza, High Dose Seasonal PF 09/19/2013, 10/10/2016, 10/02/2018, 09/20/2019, 10/01/2022, 09/07/2023   Influenza,inj,Quad PF,6+ Mos 09/03/2014   Influenza-Unspecified 08/21/2015, 10/03/2017, 09/20/2019, 09/17/2021   PFIZER(Purple Top)SARS-COV-2 Vaccination 01/24/2020, 02/14/2020, 11/12/2020   PNEUMOCOCCAL CONJUGATE-20 01/23/2024   Pneumococcal Conjugate-13 03/24/2016   Pneumococcal Polysaccharide-23 03/20/2013   Tdap 10/03/2012, 01/06/2023   Zoster Recombinant(Shingrix) 10/31/2018, 01/30/2019   Zoster, Live 09/03/2014,  09/17/2014   Health Maintenance Due  Topic Date Due   Diabetic kidney evaluation - Urine ACR  Never done   Colonoscopy  07/15/2024   FOOT EXAM  07/07/2024   INFLUENZA VACCINE  07/20/2024   HEMOGLOBIN A1C  07/22/2024   Health Maintenance  Topic Date Due   Diabetic kidney evaluation - Urine ACR  Never done   Colonoscopy  07/15/2024   FOOT EXAM  07/07/2024  INFLUENZA VACCINE  07/20/2024   HEMOGLOBIN A1C  07/22/2024   OPHTHALMOLOGY EXAM  09/21/2024   Diabetic kidney evaluation - eGFR measurement  01/22/2025   Medicare Annual Wellness (AWV)  07/12/2025   DTaP/Tdap/Td (4 - Td or Tdap) 01/06/2033   Pneumococcal Vaccine: 50+ Years  Completed   Hepatitis C Screening  Completed   Zoster Vaccines- Shingrix  Completed   Hepatitis B Vaccines  Aged Out   HPV VACCINES  Aged Out   Meningococcal B Vaccine  Aged Out   COVID-19 Vaccine  Discontinued    Reviewed the following verbally with patient and provided AVS materials:   HEALTH MAINTENANCE COUNSELING AND ANTICIPATORY GUIDANCE    Preventive Measure Recommendation  Eye Exams Every 1-2 years  he sees eye Dr.. coming in fall  Dental Care Cleanings every 6 months or more, brush/floss 3x daily he is doing every 6 month  Sinus Care Saline spray rinses daily- he does as needed   Sleep 8 hours nightly, good sleep hygiene, e-monitoring if any daytime drowsiness;  he only gets drowsy after golfing.  Getting good sleep.  Diet Fruits/vegetables/fiber/healthy fats, balance and moderation  Exercise 150 minutes weekly;   he golfs 4x weekly   Risk Behaviors Discouraged any/all high risk behaviors    CANCER SCREENING SHARED DECISION MAKING    Penile/Testicle/Scrotum Encouraged self-monitoring and reporting of genital abnormalities. Patient reports none.  Thyroid  Checked and advised to palpate thyroid  for nodules  Prostate Individualized risks/benefits/costs discussed Lab Results  Component Value Date   PSA 2.07 07/12/2023   PSA 1.68 04/27/2021    PSA 1.62 04/24/2020   PSA 1.47 04/26/2019   PSA 2.28 04/17/2018   PSA 1.66 04/14/2017   PSA 1.47 03/26/2016   PSA 1.76 03/24/2015   PSA 1.94 03/07/2014    Colon [facility] Colonoscopy          Current Care Gaps     Colonoscopy (Every 3 Years) Overdue since 07/15/2024    07/15/2021  COLONOSCOPY - planned to repeat with Dr. Albertus later this year   Only the first 1 history entries have been loaded, but more history exists.                Lung Current guidelines recommend individuals aged 27 to 46 who currently smoke or formerly smoked and have a >= 20 pack-year smoking history should undergo annual screening with low-dose computed tomography (LDCT). Tobacco Use: Low Risk  (07/26/2024)   Patient History    Smoking Tobacco Use: Never    Smokeless Tobacco Use: Never    Passive Exposure: Not on file    Skin Advised regular sunscreen use. Patient denies worrisome, changing, or new skin lesions. Offered to include images in chart for surveillance. Showed patient these pictures of melanomas for reference to educate for self-monitoring.  He following with dermatology every 6 months due to having spots removed - wears sunblock all the time        Discussed the use of AI scribe software for clinical note transcription with the patient, who gave verbal consent to proceed.  History of Present Illness ENOCH MOFFA is a 78 year old male with hypertension and borderline diabetes who presents for a routine follow-up and annual physical exam.  He has not been monitoring his blood pressure at home recently, but it typically ranges between 120/80 mmHg and 130/80 mmHg with medication. He is currently taking hydrochlorothiazide  and lisinopril  for blood pressure management. For his borderline diabetes, he is not on any medication  and manages his condition through diet alone. Dietary changes since his gallbladder removal include avoiding beef and greasy foods, focusing on chicken and fish, and trying to  avoid snacks, although he occasionally consumes them when available.  He has a history of hip and shoulder surgeries, performed by Dr. Ernie and Dr. Melita respectively, but has not had any follow-up since the surgeries. He does not have a cardiologist and last had a stress test in [place]  before retiring, around 2005-2008. He has not had any heart-related issues since then.  He has a history of nail fungus, which has improved with the use of a topical cream. He continues to use the cream as a preventive measure. He has had a reaction to oral antifungal medications in the past, causing nausea.  His diet includes limited carbohydrates, avoiding potatoes and opting for rice, although he is open to trying alternatives like riced cauliflower. He consumes regular yogurt and is interested in low-carb options. He exercises regularly, playing golf four times a week and participating in water  aerobics during bad weather.  He has a family history of lung cancer and COPD, with both parents being heavy smokers. He does not smoke and has regular dermatology visits due to sensitive skin and sun exposure. He uses sunscreen with SPF 50.  He experiences occasional drowsiness after playing golf but otherwise maintains a regular sleep schedule. He has hearing aids from [employer] in [facility] and reports a history of ear wax production, which he manages by cleaning his ears regularly. No fever, foamy urine, or significant drowsiness outside of post-golf naps.    ROS A comprehensive ROS was negative for any concerning symptoms.   Completed medication reconciliation: Current Outpatient Medications on File Prior to Visit  Medication Sig   Ascorbic Acid (VITAMIN C PO) Take 1 tablet by mouth every morning.   aspirin  EC 81 MG tablet Take 81 mg by mouth daily. Swallow whole.   cetirizine (ZYRTEC) 10 MG tablet Take 10 mg by mouth every morning.   ciclopirox  (LOPROX ) 0.77 % cream Apply topically 2 (two) times  daily.   Clotrimazole  1 % OINT Apply 1 Application topically 2 (two) times a week.   finasteride  (PROSCAR ) 5 MG tablet Take 1 tablet (5 mg total) by mouth at bedtime.   lisinopril  (ZESTRIL ) 40 MG tablet TAKE 1 TABLET BY MOUTH DAILY   Multiple Vitamins-Minerals (ZINC PO) Take 1 tablet by mouth every morning.   omeprazole  (PRILOSEC) 10 MG capsule Take 1 capsule (10 mg total) by mouth daily.   Probiotic Product (PROBIOTIC DAILY PO) Take 1 tablet by mouth daily.   simvastatin  (ZOCOR ) 20 MG tablet TAKE 1 TABLET BY MOUTH AT  BEDTIME   VITAMIN D PO Take 1 tablet by mouth every morning.   amoxicillin  (AMOXIL ) 500 MG capsule For dental procedures.   clobetasol cream (TEMOVATE) 0.05 % Apply topically 2 (two) times daily. (Patient taking differently: Apply topically 2 (two) times daily. PRN)   GLUCOSAMINE-CHONDROITIN PO Take 1 tablet by mouth every morning.   omeprazole  (PRILOSEC) 20 MG capsule Take 20 mg by mouth daily.   Urea  40 % LOTN Apply 1 Application topically daily. To help toenail antifungals work (Patient not taking: Reported on 07/12/2024)   No current facility-administered medications on file prior to visit.   Medications Discontinued During This Encounter  Medication Reason   hydrochlorothiazide  (HYDRODIURIL ) 25 MG tablet Completed Course  The following were reviewed and/or entered/updated into our electronic MEDICAL RECORD NUMBERPast Medical History:  Diagnosis Date  Arthritis    BPH (benign prostatic hyperplasia) 10/19/2013   GERD (gastroesophageal reflux disease)    History of cholecystectomy 01/06/2023   12/21/2021, for gallstones/pain/dilated cbd     History of stomach ulcers    age 24   Hypertension    Pneumonia    x4   Pre-diabetes    Seasonal allergies 01/06/2023   Skin cancer    Ulcer    Had when I was 6 year's old   Past Surgical History:  Procedure Laterality Date   APPENDECTOMY     CHOLECYSTECTOMY N/A 12/21/2021   Procedure: LAPAROSCOPIC CHOLECYSTECTOMY;  Surgeon:  Dasie Leonor CROME, MD;  Location: [facility];  Service: General;  Laterality: N/A;   COLONOSCOPY  2010   [place] =normal exam per pt   ERCP N/A 12/20/2021   Procedure: ENDOSCOPIC RETROGRADE CHOLANGIOPANCREATOGRAPHY (ERCP);  Surgeon: Aneita Gwendlyn DASEN, MD;  Location: [facility];  Service: Endoscopy;  Laterality: N/A;   EYE SURGERY  2023   Cataracts   INGUINAL HERNIA REPAIR Right    JOINT REPLACEMENT  2013, 2021 & 2023   Both hips and right knee   left shoulder rotator and bicep  01/10/2017   MEDIAL PARTIAL KNEE REPLACEMENT Right 2013   REMOVAL OF STONES  12/20/2021   Procedure: REMOVAL OF STONES;  Surgeon: Aneita Gwendlyn DASEN, MD;  Location: [facility];  Service: Endoscopy;;   SPHINCTEROTOMY  12/20/2021   Procedure: ANNETT;  Surgeon: Aneita Gwendlyn DASEN, MD;  Location: [facility];  Service: Endoscopy;;   TONSILLECTOMY AND ADENOIDECTOMY     TOTAL HIP ARTHROPLASTY Left 09/30/2020   Procedure: TOTAL HIP ARTHROPLASTY ANTERIOR APPROACH;  Surgeon: Ernie Cough, MD;  Location: [facility];  Service: Orthopedics;  Laterality: Left;  70 mins   TOTAL HIP ARTHROPLASTY Right 01/19/2022   Procedure: TOTAL HIP ARTHROPLASTY ANTERIOR APPROACH;  Surgeon: Ernie Cough, MD;  Location: [facility];  Service: Orthopedics;  Laterality: Right;   UPPER GI ENDOSCOPY     Social History   Socioeconomic History   Marital status: Married    Spouse name: Inocente   Number of children: 2   Years of education: Not on file   Highest education level: Associate degree: occupational, Scientist, product/process development, or vocational program  Occupational History   Occupation: retired  Tobacco Use   Smoking status: Never   Smokeless tobacco: Never  Vaping Use   Vaping status: Never Used  Substance and Sexual Activity   Alcohol use: Yes    Alcohol/week: 1.0 standard drink of alcohol    Types: 1 Cans of beer per week    Comment: 1-2 drinks monthly or less   Drug use: No   Sexual activity: Yes  Other Topics Concern   Not on file  Social History  Narrative   Not on file   Social Drivers of Health   Financial Resource Strain: Low Risk  (07/12/2024)   Overall Financial Resource Strain (CARDIA)    Difficulty of Paying Living Expenses: Not hard at all  Food Insecurity: No Food Insecurity (07/12/2024)   Hunger Vital Sign    Worried About Running Out of Food in the Last Year: Never true    Ran Out of Food in the Last Year: Never true  Transportation Needs: No Transportation Needs (07/12/2024)   PRAPARE - Administrator, Civil Service (Medical): No    Lack of Transportation (Non-Medical): No  Physical Activity: Insufficiently Active (07/12/2024)   Exercise Vital Sign    Days of Exercise per Week: 3 days  Minutes of Exercise per Session: 30 min  Stress: No Stress Concern Present (07/12/2024)   [employer] of Occupational Health - Occupational Stress Questionnaire    Feeling of Stress: Not at all  Social Connections: Moderately Isolated (07/12/2024)   Social Connection and Isolation Panel    Frequency of Communication with Friends and Family: Twice a week    Frequency of Social Gatherings with Friends and Family: More than three times a week    Attends Religious Services: Never    Database administrator or Organizations: No    Attends Banker Meetings: Never    Marital Status: Married  Catering manager Violence: Not At Risk (07/12/2024)   Humiliation, Afraid, Rape, and Kick questionnaire    Fear of Current or Ex-Partner: No    Emotionally Abused: No    Physically Abused: No    Sexually Abused: No      07/12/2024    3:07 PM  Alcohol Use Disorder Test (AUDIT)  1. How often do you have a drink containing alcohol? 1  2. How many drinks containing alcohol do you have on a typical day when you are drinking? 0  3. How often do you have six or more drinks on one occasion? 0  AUDIT-C Score 1   Family History  Problem Relation Age of Onset   Colon polyps Mother    Hypertension Mother    COPD Mother     Arthritis Mother    Cancer Father        lung - smoker   Hyperlipidemia Maternal Grandfather    Heart disease Maternal Grandfather    Stroke Maternal Grandfather    Stroke Paternal Grandmother    Heart disease Paternal Grandmother    Hyperlipidemia Paternal Grandfather    Diabetes Neg Hx    Colon cancer Neg Hx    Esophageal cancer Neg Hx    Rectal cancer Neg Hx    Stomach cancer Neg Hx   No Known Allergies Social History   Substance and Sexual Activity  Sexual Activity Yes  @    07/12/2024    3:08 PM  Depression screen PHQ 2/9  Decreased Interest 0  Down, Depressed, Hopeless 0  PHQ - 2 Score 0  Altered sleeping 0  Tired, decreased energy 0  Change in appetite 0  Feeling bad or failure about yourself  0  Trouble concentrating 0  Moving slowly or fidgety/restless 0  Suicidal thoughts 0  PHQ-9 Score 0  Difficult doing work/chores Not difficult at all      07/12/2024    3:11 PM  Fall Risk   Falls in the past year? 0  Number falls in past yr: 0  Injury with Fall? 0  Risk for fall due to : No Fall Risks  Follow up Falls evaluation completed     BP 100/68   Pulse 65   Temp 98 F (36.7 C) (Temporal)   Ht 5' 6 (1.676 m)   Wt 165 lb 6.4 oz (75 kg)   SpO2 98%   BMI 26.70 kg/m  BP Readings from Last 3 Encounters:  07/26/24 100/68  01/23/24 102/60  01/15/24 119/75   Wt Readings from Last 10 Encounters:  07/26/24 165 lb 6.4 oz (75 kg)  07/12/24 165 lb (74.8 kg)  01/23/24 173 lb (78.5 kg)  01/15/24 170 lb (77.1 kg)  07/08/23 170 lb 3.2 oz (77.2 kg)  02/22/23 172 lb 9.6 oz (78.3 kg)  01/06/23 174 lb 3.2 oz (79 kg)  05/03/22 169 lb 12.8 oz (77 kg)  01/19/22 165 lb 6.4 oz (75 kg)  01/07/22 165 lb 6.4 oz (75 kg)  Physical Exam  Physical Exam HEENT: Cerumen in ear behind hearing aid. Normal oropharynx. NECK: Neck supple, no lymphadenopathy. CHEST: Clear to auscultation bilaterally. CARDIOVASCULAR: Normal heart rate and rhythm, S1 and S2 normal without  murmurs. EXTREMITIES: Dry skin on legs. Difficult to feel pulse in extremities. Capillary refill time 3 seconds. Hair present on toes. Fungus present on toe. NEUROLOGICAL: Cranial nerves grossly intact.  GEN: No acute distress, resting comfortably. HEENT: Tympanic membranes normal appearing bilaterally, oropharynx clear, no thyromegaly noted, no palpable lymphadenopathy or thyroid  nodules. CARDIOVASCULAR: S1 and S2 heart sounds with regular rate and rhythm, no murmurs appreciated. PULMONARY: Normal work of breathing, clear to auscultation bilaterally, no crackles, wheezes, or rhonchi. ABDOMEN: Soft, nontender, nondistended. MSK: No edema, cyanosis, or clubbing noted. SKIN: Warm, dry, no lesions of concern observed. NEUROLOGICAL: Cranial nerves II-XII grossly intact, strength 5/5 in upper and lower extremities, reflexes symmetric and intact bilaterally. PSYCH: Normal affect and thought content, pleasant and cooperative.  Last CBC Lab Results  Component Value Date   WBC 7.2 01/23/2024   HGB 15.6 01/23/2024   HCT 45.2 01/23/2024   MCV 92.6 01/23/2024   MCH 32.8 01/20/2022   RDW 13.2 01/23/2024   PLT 270.0 01/23/2024   Last metabolic panel Lab Results  Component Value Date   GLUCOSE 162 (H) 01/23/2024   NA 137 01/23/2024   K 3.6 01/23/2024   CL 102 01/23/2024   CO2 24 01/23/2024   BUN 21 01/23/2024   CREATININE 1.04 01/23/2024   GFR 70.23 01/23/2024   CALCIUM 9.5 01/23/2024   PROT 7.2 01/23/2024   ALBUMIN  4.5 01/23/2024   BILITOT 0.7 01/23/2024   ALKPHOS 56 01/23/2024   AST 21 01/23/2024   ALT 29 01/23/2024   ANIONGAP 6 01/20/2022   Last lipids Lab Results  Component Value Date   CHOL 117 01/23/2024   HDL 32.40 (L) 01/23/2024   LDLCALC 50 01/23/2024   LDLDIRECT 85.0 04/27/2021   TRIG 173.0 (H) 01/23/2024   CHOLHDL 4 01/23/2024   Last hemoglobin A1c Lab Results  Component Value Date   HGBA1C 6.9 (H) 01/23/2024  No results found for: LABMICR,  MICROALBUR  Last thyroid  functions Lab Results  Component Value Date   TSH 1.90 07/12/2023   Last vitamin D No results found for: 25OHVITD2, 25OHVITD3, VD25OH Last vitamin B12 and Folate No results found for: VITAMINB12, FOLATE      ======================================  IMPORTANT HEALTH REMINDERS: Report any new or changing skin lesions promptly Maintain recommended screening schedules Discuss any new family history of cancer at future visits Follow up on any new symptoms that persist more than two weeks      Notes:  This document was synthesized by artificial intelligence (Abridge) using HIPAA-compliant recording of the clinical interaction;   We discussed the use of AI scribe software for clinical note transcription with the patient, who gave verbal consent to proceed.    This encounter employed state-of-the-art, real-time, collaborative documentation. The patient was empowered to actively review and assist in updating their electronic medical record on a shared monitor, ensuring transparency and improving accuracy.    Prior to and at the beginning of Comprehensive Physical Exam (CPE) preventive care annual visit appointment types  we clarify to patients Our goal today is to focus on your preventive or annual Comprehensive Physical Exam (CPE) preventive care annual visit, which typically covers routine  screenings and overall health maintenance. However, if you share any new or concerning symptoms--such as dizziness, passing out, severe pain, or anything else that may point to a more serious issue--we are both legally and ethically required to evaluate it. We cannot simply overlook or ignore such concerns, even if you later decide you don't want to discuss them, because it could jeopardize your health.  If addressing a new concern takes us  beyond the scope of the preventive visit, we may need to bill separately for that portion of care. We understand financial  considerations are important, and we're happy to discuss your options if something new comes up. However, we want to be clear that once you mention a potentially serious issue, we must investigate it; we can't ethically or legally exclude that from our records or our evaluation. Please let us  know all of your questions or worries. Together, we can decide how best to manage them and how to minimize any unexpected costs, but we want to keep you safe above all else.   This disclosure is mandated by professional ethics and legal obligations, as healthcare providers must address any substantial health concerns raised during any patient interaction and a comprehensive ROS is required by insurance companies for billing preventive-care visit type.   This disclosure ultimately discourages patients financially from reporting significant health issues.   Medical Screening Exam A medical screening exam (MSE) helps to determine whether you need immediate medical treatment relating to any number of symptoms you are having. This type of exam may be done in an emergency department, an urgent care setting, or your health care provider's office. Depending on your symptoms and severity, you may need additional tests or medical therapy. It is important to note that an MSE does not necessarily mean that you will need or receive further medical testing or interventions if your symptoms are not deemed to be medically urgent (emergent). Tell a health care provider about: Any allergies you have. All medicines you are taking, including vitamins, herbs, eye drops, creams, and over-the-counter medicines. Any problems you or family members have had with anesthetic medicines. Any bleeding problems you have. Any surgeries you have had. Any medical conditions you have. Whether you are pregnant or may be pregnant. What happens during the test? During the exam, a health care provider does a short, often focused, physical exam and  asks about your medical history to assess: Your current symptoms. Your overall health. Your need for possible further medical intervention. What can I expect after the test? If you have a regular health care provider, make an appointment for a follow-up visit with him or her. If you do not have a regular health care provider, ask about resources in your community. Your medical screening exam may determine that: You do not need emergency treatment at this time. You need treatment right away. You need to be transferred to another medical center. This may happen if you need an emergent specialist or consultant that is not available at the medical center you are at. You need to have more tests. A medical specialist may be consulted if needed. Get help right away if: Your condition gets worse. You develop new or troubling symptoms before you see your health care provider. These symptoms may represent a serious problem that is an emergency. Do not wait to see if the symptoms will go away. Get medical help right away. Call your local emergency services (911 in the [place] Do not drive yourself to the hospital. Summary  A medical screening exam helps to determine whether you need medical treatment right away. This type of exam may be done in an emergency department, an urgent care setting, or your health care provider's office. During the exam, a health care provider does a short physical exam and asks about your current symptoms and overall health. Depending on the exam, more tests or therapies may be ordered. However, an MSE does not necessarily mean that you will have further medical testing if your symptoms are not deemed to be urgent. If you need further care that is not offered at your current medical center, you may need to be transferred to another facility. This information is not intended to replace advice given to you by your health care provider. Make sure you discuss any questions you have  with your health care provider. Document Revised: 08/19/2021 Document Reviewed: 04/16/2021 Elsevier Patient Education  2024 [employer]   Health Maintenance, Male Adopting a healthy lifestyle and getting preventive care are important in promoting health and wellness. Ask your health care provider about: The right schedule for you to have regular tests and exams. Things you can do on your own to prevent diseases and keep yourself healthy. What should I know about diet, weight, and exercise? Eat a healthy diet  Eat a diet that includes plenty of vegetables, fruits, low-fat dairy products, and lean protein. Do not eat a lot of foods that are high in solid fats, added sugars, or sodium. Maintain a healthy weight Body mass index (BMI) is a measurement that can be used to identify possible weight problems. It estimates body fat based on height and weight. Your health care provider can help determine your BMI and help you achieve or maintain a healthy weight. Get regular exercise Get regular exercise. This is one of the most important things you can do for your health. Most adults should: Exercise for at least 150 minutes each week. The exercise should increase your heart rate and make you sweat (moderate-intensity exercise). Do strengthening exercises at least twice a week. This is in addition to the moderate-intensity exercise. Spend less time sitting. Even light physical activity can be beneficial. Watch cholesterol and blood lipids Have your blood tested for lipids and cholesterol at 76 years of age, then have this test every 5 years. You may need to have your cholesterol levels checked more often if: Your lipid or cholesterol levels are high. You are older than 76 years of age. You are at high risk for heart disease. What should I know about cancer screening? Many types of cancers can be detected early and may often be prevented. Depending on your health history and family history, you  may need to have cancer screening at various ages. This may include screening for: Colorectal cancer. Prostate cancer. Skin cancer. Lung cancer. What should I know about heart disease, diabetes, and high blood pressure? Blood pressure and heart disease High blood pressure causes heart disease and increases the risk of stroke. This is more likely to develop in people who have high blood pressure readings or are overweight. Talk with your health care provider about your target blood pressure readings. Have your blood pressure checked: Every 3-5 years if you are 56-23 years of age. Every year if you are 56 years old or older. If you are between the ages of 46 and 30 and are a current or former smoker, ask your health care provider if you should have a one-time screening for abdominal aortic aneurysm (AAA).  Diabetes Have regular diabetes screenings. This checks your fasting blood sugar level. Have the screening done: Once every three years after age 58 if you are at a normal weight and have a low risk for diabetes. More often and at a younger age if you are overweight or have a high risk for diabetes. What should I know about preventing infection? Hepatitis B If you have a higher risk for hepatitis B, you should be screened for this virus. Talk with your health care provider to find out if you are at risk for hepatitis B infection. Hepatitis C Blood testing is recommended for: Everyone born from [birth year] through 1963. Anyone with known risk factors for hepatitis C. Sexually transmitted infections (STIs) You should be screened each year for STIs, including gonorrhea and chlamydia, if: You are sexually active and are younger than 76 years of age. You are older than 76 years of age and your health care provider tells you that you are at risk for this type of infection. Your sexual activity has changed since you were last screened, and you are at increased risk for chlamydia or gonorrhea. Ask your  health care provider if you are at risk. Ask your health care provider about whether you are at high risk for HIV. Your health care provider may recommend a prescription medicine to help prevent HIV infection. If you choose to take medicine to prevent HIV, you should first get tested for HIV. You should then be tested every 3 months for as long as you are taking the medicine. Follow these instructions at home: Alcohol use Do not drink alcohol if your health care provider tells you not to drink. If you drink alcohol: Limit how much you have to 0-2 drinks a day. Know how much alcohol is in your drink. In the [place] one drink equals one 12 oz bottle of beer (355 mL), one 5 oz glass of wine (148 mL), or one 1 oz glass of hard liquor (44 mL). Lifestyle Do not use any products that contain nicotine or tobacco. These products include cigarettes, chewing tobacco, and vaping devices, such as e-cigarettes. If you need help quitting, ask your health care provider. Do not use street drugs. Do not share needles. Ask your health care provider for help if you need support or information about quitting drugs. General instructions Schedule regular health, dental, and eye exams. Stay current with your vaccines. Tell your health care provider if: You often feel depressed. You have ever been abused or do not feel safe at home. Summary Adopting a healthy lifestyle and getting preventive care are important in promoting health and wellness. Follow your health care provider's instructions about healthy diet, exercising, and getting tested or screened for diseases. Follow your health care provider's instructions on monitoring your cholesterol and blood pressure. This information is not intended to replace advice given to you by your health care provider. Make sure you discuss any questions you have with your health care provider. Document Revised: 04/27/2021 Document Reviewed: 04/27/2021 Elsevier Patient Education   2024 [employer].

## 2024-07-26 NOTE — Assessment & Plan Note (Signed)
 Following with urology share PSA after shared decision making he will get done here

## 2024-07-26 NOTE — Assessment & Plan Note (Signed)
 Borderline controlled Type 2 diabetes mellitus with previous A1c around 7. He prefers to avoid injectable medications. Order A1c test and discuss dietary modifications to reduce carbohydrate intake. Consider metformin if A1c is over 7. Order kidney screening for proteinuria and perform foot examination for diabetic complications.

## 2024-07-26 NOTE — Assessment & Plan Note (Signed)
 Mixed hyperlipidemia with previous low HDL and high triglycerides. Emphasize dietary modifications to improve lipid profile. Order lipid panel and discuss dietary changes to reduce carbohydrate intake and increase healthy fats.

## 2024-07-27 ENCOUNTER — Ambulatory Visit: Payer: Self-pay | Admitting: Internal Medicine

## 2024-07-27 LAB — PROTEIN / CREATININE RATIO, URINE
Creatinine, Urine: 79 mg/dL (ref 20–320)
Protein/Creat Ratio: 51 mg/g{creat} (ref 25–148)
Protein/Creatinine Ratio: 0.051 mg/mg{creat} (ref 0.025–0.148)
Total Protein, Urine: 4 mg/dL — ABNORMAL LOW (ref 5–25)

## 2024-07-27 LAB — TSH RFX ON ABNORMAL TO FREE T4: TSH: 1.98 u[IU]/mL (ref 0.450–4.500)

## 2024-07-27 NOTE — Progress Notes (Signed)
 Reviewed, all essentially normal except cholesterol.  Message shared to patient.

## 2024-08-29 ENCOUNTER — Telehealth: Payer: Self-pay | Admitting: Internal Medicine

## 2024-08-29 NOTE — Telephone Encounter (Signed)
 Good morning Dr. Albertus,   Referral received for patient to have recall colonoscopy that he was due for in July 2025. Patient is now age 78. Would you like for patient to be seen in office first? Or would you like a direct colonoscopy. Please advise further on scheduling.   Thank you.

## 2024-08-30 NOTE — Telephone Encounter (Signed)
 Okay for direct colonoscopy appointment for polyp surveillance via previsit

## 2024-09-14 ENCOUNTER — Encounter: Payer: Self-pay | Admitting: Internal Medicine

## 2024-11-06 ENCOUNTER — Other Ambulatory Visit: Payer: Self-pay | Admitting: Internal Medicine

## 2024-11-06 DIAGNOSIS — E782 Mixed hyperlipidemia: Secondary | ICD-10-CM

## 2024-11-06 MED ORDER — SIMVASTATIN 20 MG PO TABS: 20.0000 mg | ORAL_TABLET | Freq: Every day | ORAL | 3 refills | Status: AC

## 2024-11-06 NOTE — Telephone Encounter (Signed)
 Copied from CRM [id number]. Topic: Clinical - Medication Refill >> Nov 06, 2024  8:47 AM Viola F wrote: Medication: simvastatin  (ZOCOR ) 20 MG tablet [[id number]]  Has the patient contacted their pharmacy? Yes (Agent: If no, request that the patient contact the pharmacy for the refill. If patient does not wish to contact the pharmacy document the reason why and proceed with request.) (Agent: If yes, when and what did the pharmacy advise?)  This is the patient's preferred pharmacy:   [facility] - [place], [place] - [address] [place] [address] Phone: [phone] Fax: [phone]  Is this the correct pharmacy for this prescription? Yes If no, delete pharmacy and type the correct one.   Has the prescription been filled recently? Yes  Is the patient out of the medication? No  Has the patient been seen for an appointment in the last year OR does the patient have an upcoming appointment? Yes  Can we respond through MyChart? Yes  Agent: Please be advised that Rx refills may take up to 3 business days. We ask that you follow-up with your pharmacy.

## 2024-11-22 ENCOUNTER — Telehealth: Payer: Self-pay

## 2024-11-22 NOTE — Telephone Encounter (Signed)
 Will note this information on the PV chart

## 2024-11-22 NOTE — Telephone Encounter (Signed)
 Dr.Pyrtle, In completing the chart prep for this patient's Pre Visit appt- I was unable to find a recall assessment sheet.   Please advise if the patient can continue as scheduled with his PV and procedure appt or if the patient will need to be rescheduled for an OV.  The patient is 78 years of age. He does have a hx of colon polyps (TA x 3 frags) Please/thank you Bre, PV RN

## 2024-11-22 NOTE — Telephone Encounter (Signed)
 Yes, okay for surveillance colonoscopy in the [facility] History of adenomatous colonic polyps

## 2024-12-03 ENCOUNTER — Encounter

## 2024-12-04 ENCOUNTER — Ambulatory Visit

## 2024-12-04 ENCOUNTER — Telehealth: Payer: Self-pay | Admitting: Internal Medicine

## 2024-12-04 ENCOUNTER — Telehealth: Payer: Self-pay

## 2024-12-04 VITALS — Ht 66.0 in | Wt 176.0 lb

## 2024-12-04 DIAGNOSIS — Z860100 Personal history of colon polyps, unspecified: Secondary | ICD-10-CM

## 2024-12-04 MED ORDER — SUTAB 1479-225-188 MG PO TABS: ORAL_TABLET | ORAL | 0 refills | Status: DC

## 2024-12-04 NOTE — Telephone Encounter (Signed)
 Inbound call from patient stating that he is having issues with getting his prep medication. Requesting a call back to discuss. Please advise.

## 2024-12-04 NOTE — Progress Notes (Signed)
 No issues known to pt with past sedation with any surgeries or procedures Patient denies ever being told they had issues or difficulty with intubation  No FH of Malignant Hyperthermia Pt is not on diet pills Pt is not on home 02  Pt is not on blood thinners  Pt states periodic issues with chronic constipation;   No A fib or A flutter Have any cardiac testing pending--no Pt instructed to use [email or web address] or GoodRx for a price reduction on prep  Ambulates independently

## 2024-12-04 NOTE — Telephone Encounter (Signed)
 RN returned call to patient who was having difficulty obtaining his Sutab  prescription from a pharmacy. RN staff found an unopened sample box. RN told patient she will leave this box at the receptionist desk where he checked in earlier today for his PV.   Patient stated he will come now to pick this up. Patient was appreciative. He had no further quesitons.

## 2024-12-05 ENCOUNTER — Other Ambulatory Visit: Payer: Self-pay | Admitting: Internal Medicine

## 2024-12-05 DIAGNOSIS — I10 Essential (primary) hypertension: Secondary | ICD-10-CM

## 2024-12-10 ENCOUNTER — Encounter: Payer: Self-pay | Admitting: Internal Medicine

## 2024-12-17 ENCOUNTER — Ambulatory Visit: Admitting: Internal Medicine

## 2024-12-17 ENCOUNTER — Encounter: Payer: Self-pay | Admitting: Internal Medicine

## 2024-12-17 VITALS — BP 126/70 | HR 74 | Temp 97.9°F | Resp 11 | Ht 66.0 in | Wt 176.0 lb

## 2024-12-17 DIAGNOSIS — K5730 Diverticulosis of large intestine without perforation or abscess without bleeding: Secondary | ICD-10-CM

## 2024-12-17 DIAGNOSIS — Z860101 Personal history of adenomatous and serrated colon polyps: Secondary | ICD-10-CM

## 2024-12-17 DIAGNOSIS — Z1211 Encounter for screening for malignant neoplasm of colon: Secondary | ICD-10-CM | POA: Diagnosis not present

## 2024-12-17 DIAGNOSIS — D122 Benign neoplasm of ascending colon: Secondary | ICD-10-CM

## 2024-12-17 DIAGNOSIS — Z860100 Personal history of colon polyps, unspecified: Secondary | ICD-10-CM

## 2024-12-17 DIAGNOSIS — K648 Other hemorrhoids: Secondary | ICD-10-CM

## 2024-12-17 DIAGNOSIS — D123 Benign neoplasm of transverse colon: Secondary | ICD-10-CM

## 2024-12-17 MED ORDER — SODIUM CHLORIDE 0.9 % IV SOLN: 500.0000 mL | INTRAVENOUS | Status: AC

## 2024-12-17 NOTE — Progress Notes (Signed)
 Report to PACU, RN, vss, BBS= Clear.

## 2024-12-17 NOTE — Op Note (Signed)
 [facility] Patient Name: Christian Dodson Procedure Date: 12/17/2024 9:22 AM MRN: [id number] Endoscopist: Gordy CHRISTELLA Starch , MD, [id number] Age: 78 Referring MD:  Date of Birth: 24-Oct-1946 Gender: Male Account #: [id number] Procedure:                Colonoscopy Indications:              High risk colon cancer surveillance: Personal                            history of multiple adenomas, Last colonoscopy:                            July 2022 (TA x 3) Medicines:                Monitored Anesthesia Care Procedure:                Pre-Anesthesia Assessment:                           - Prior to the procedure, a History and Physical                            was performed, and patient medications and                            allergies were reviewed. The patient's tolerance of                            previous anesthesia was also reviewed. The risks                            and benefits of the procedure and the sedation                            options and risks were discussed with the patient.                            All questions were answered, and informed consent                            was obtained. Prior Anticoagulants: The patient has                            taken no anticoagulant or antiplatelet agents. ASA                            Grade Assessment: II - A patient with mild systemic                            disease. After reviewing the risks and benefits,                            the patient was deemed in satisfactory condition to  undergo the procedure.                           After obtaining informed consent, the colonoscope                            was passed under direct vision. Throughout the                            procedure, the patient's blood pressure, pulse, and                            oxygen saturations were monitored continuously. The                            CF [id number] #[id number] was introduced through the  anus                            and advanced to the cecum, identified by                            appendiceal orifice and ileocecal valve. The                            colonoscopy was performed without difficulty. The                            patient tolerated the procedure well. The quality                            of the bowel preparation was good. The ileocecal                            valve, appendiceal orifice, and rectum were                            photographed. Scope In: 9:38:49 AM Scope Out: 9:49:13 AM Scope Withdrawal Time: 0 hours 7 minutes 41 seconds  Total Procedure Duration: 0 hours 10 minutes 24 seconds  Findings:                 The digital rectal exam was normal.                           A 6 mm polyp was found in the ascending colon. The                            polyp was sessile. The polyp was removed with a                            cold snare. Resection and retrieval were complete.                           A 4 mm polyp was found in the transverse colon. The  polyp was sessile. The polyp was removed with a                            cold snare. Resection and retrieval were complete.                           Multiple small-mouthed diverticula were found in                            the sigmoid colon and descending colon.                           Internal hemorrhoids were found during                            retroflexion. The hemorrhoids were small. Complications:            No immediate complications. Estimated Blood Loss:     Estimated blood loss: none. Impression:               - One 6 mm polyp in the ascending colon, removed                            with a cold snare. Resected and retrieved.                           - One 4 mm polyp in the transverse colon, removed                            with a cold snare. Resected and retrieved.                           - Mild diverticulosis in the sigmoid colon and in                             the descending colon.                           - Small internal hemorrhoids. Recommendation:           - Patient has a contact number available for                            emergencies. The signs and symptoms of potential                            delayed complications were discussed with the                            patient. Return to normal activities tomorrow.                            Written discharge instructions were provided to the  patient.                           - Resume previous diet.                           - Continue present medications.                           - Await pathology results.                           - Repeat colonoscopy is recommended for                            surveillance. The colonoscopy date will be                            determined after pathology results from today's                            exam become available for review. Gordy CHRISTELLA Starch, MD 12/17/2024 9:53:59 AM This report has been signed electronically.

## 2024-12-17 NOTE — Progress Notes (Signed)
 VS by EJ  Pt's states no medical or surgical changes since previsit or office visit.

## 2024-12-17 NOTE — Progress Notes (Signed)
 "   GASTROENTEROLOGY PROCEDURE H&P NOTE   Primary Care Physician: Jesus Bernardino MATSU, MD    Reason for Procedure:  History of colonic polyps  Plan:    Surveillance colonoscopy  Patient is appropriate for endoscopic procedure(s) in the ambulatory ([facility]) setting.  The nature of the procedure, as well as the risks, benefits, and alternatives were carefully and thoroughly reviewed with the patient. Ample time for discussion and questions allowed.  All questions were answered. The patient understood, was satisfied, and agreed with the plan to proceed.    HPI: Christian Dodson is a 78 y.o. male who presents for surveillance colonoscopy.  Medical history as below.  Tolerated the prep.  No recent chest pain or shortness of breath.  No abdominal pain today.  Past Medical History:  Diagnosis Date   Arthritis    BPH (benign prostatic hyperplasia) 10/19/2013   GERD (gastroesophageal reflux disease)    History of cholecystectomy 01/06/2023   12/21/2021, for gallstones/pain/dilated cbd     History of stomach ulcers    age 82   Hypertension    Pneumonia    x4   Pre-diabetes    Seasonal allergies 01/06/2023   Skin cancer    Ulcer    Had when I was 78 years old    Past Surgical History:  Procedure Laterality Date   APPENDECTOMY     CHOLECYSTECTOMY N/A 12/21/2021   Procedure: LAPAROSCOPIC CHOLECYSTECTOMY;  Surgeon: Dasie Leonor CROME, MD;  Location: [facility];  Service: General;  Laterality: N/A;   COLONOSCOPY  2010   [place] =normal exam per pt   ERCP N/A 12/20/2021   Procedure: ENDOSCOPIC RETROGRADE CHOLANGIOPANCREATOGRAPHY (ERCP);  Surgeon: Aneita Gwendlyn DASEN, MD;  Location: [facility];  Service: Endoscopy;  Laterality: N/A;   EYE SURGERY  2023   Cataracts   INGUINAL HERNIA REPAIR Right    JOINT REPLACEMENT  2013, 2021 & 2023   Both hips and right knee   left shoulder rotator and bicep  01/10/2017   MEDIAL PARTIAL KNEE REPLACEMENT Right 2013   REMOVAL OF STONES  12/20/2021   Procedure:  REMOVAL OF STONES;  Surgeon: Aneita Gwendlyn DASEN, MD;  Location: [facility];  Service: Endoscopy;;   SPHINCTEROTOMY  12/20/2021   Procedure: ANNETT;  Surgeon: Aneita Gwendlyn DASEN, MD;  Location: [facility];  Service: Endoscopy;;   TONSILLECTOMY AND ADENOIDECTOMY     TOTAL HIP ARTHROPLASTY Left 09/30/2020   Procedure: TOTAL HIP ARTHROPLASTY ANTERIOR APPROACH;  Surgeon: Ernie Cough, MD;  Location: [facility];  Service: Orthopedics;  Laterality: Left;  70 mins   TOTAL HIP ARTHROPLASTY Right 01/19/2022   Procedure: TOTAL HIP ARTHROPLASTY ANTERIOR APPROACH;  Surgeon: Ernie Cough, MD;  Location: [facility];  Service: Orthopedics;  Laterality: Right;   UPPER GI ENDOSCOPY      Prior to Admission medications  Medication Sig Start Date End Date Taking? Authorizing Provider  Ascorbic Acid (VITAMIN C PO) Take 1 tablet by mouth every morning.   Yes [provider]  aspirin  EC 81 MG tablet Take 81 mg by mouth daily. Swallow whole.   Yes [provider]  cetirizine (ZYRTEC) 10 MG tablet Take 10 mg by mouth every morning.   Yes [provider]  Cholecalciferol (VITAMIN D3) POWD Take by mouth.   Yes [provider]  finasteride  (PROSCAR ) 5 MG tablet Take 1 tablet (5 mg total) by mouth at bedtime. 01/06/23  Yes Jesus Bernardino MATSU, MD  Glucosamine-Chondroitin (OSTEO BI-FLEX REGULAR STRENGTH PO) Take by mouth.   Yes  [provider]  GLUCOSAMINE-CHONDROITIN PO Take 1 tablet by mouth every morning.   Yes [provider]  hydrochlorothiazide  (HYDRODIURIL ) 25 MG tablet TAKE 1 TABLET BY MOUTH DAILY 12/06/24  Yes Jesus Bernardino MATSU, MD  lisinopril  (ZESTRIL ) 40 MG tablet TAKE 1 TABLET BY MOUTH DAILY 12/06/24  Yes Jesus Bernardino MATSU, MD  omeprazole  (PRILOSEC) 20 MG capsule Take 20 mg by mouth daily.   Yes [provider]  Probiotic Product (PROBIOTIC DAILY PO) Take 1 tablet by mouth daily.   Yes [provider]  simvastatin  (ZOCOR ) 20 MG tablet Take 1  tablet (20 mg total) by mouth at bedtime. 11/06/24  Yes Jesus Bernardino MATSU, MD  VITAMIN D PO Take 1 tablet by mouth every morning.   Yes [provider]  zinc gluconate 50 MG tablet Take 50 mg by mouth daily.   Yes [provider]  amoxicillin  (AMOXIL ) 500 MG capsule For dental procedures. Patient not taking: No sig reported 01/16/24   [provider]    Current Outpatient Medications  Medication Sig Dispense Refill   Ascorbic Acid (VITAMIN C PO) Take 1 tablet by mouth every morning.     aspirin  EC 81 MG tablet Take 81 mg by mouth daily. Swallow whole.     cetirizine (ZYRTEC) 10 MG tablet Take 10 mg by mouth every morning.     Cholecalciferol (VITAMIN D3) POWD Take by mouth.     finasteride  (PROSCAR ) 5 MG tablet Take 1 tablet (5 mg total) by mouth at bedtime. 90 tablet 3   Glucosamine-Chondroitin (OSTEO BI-FLEX REGULAR STRENGTH PO) Take by mouth.     GLUCOSAMINE-CHONDROITIN PO Take 1 tablet by mouth every morning.     hydrochlorothiazide  (HYDRODIURIL ) 25 MG tablet TAKE 1 TABLET BY MOUTH DAILY 90 tablet 3   lisinopril  (ZESTRIL ) 40 MG tablet TAKE 1 TABLET BY MOUTH DAILY 90 tablet 3   omeprazole  (PRILOSEC) 20 MG capsule Take 20 mg by mouth daily.     Probiotic Product (PROBIOTIC DAILY PO) Take 1 tablet by mouth daily.     simvastatin  (ZOCOR ) 20 MG tablet Take 1 tablet (20 mg total) by mouth at bedtime. 90 tablet 3   VITAMIN D PO Take 1 tablet by mouth every morning.     zinc gluconate 50 MG tablet Take 50 mg by mouth daily.     amoxicillin  (AMOXIL ) 500 MG capsule For dental procedures. (Patient not taking: No sig reported)     Current Facility-Administered Medications  Medication Dose Route Frequency Provider Last Rate Last Admin   0.9 %  sodium chloride  infusion  500 mL Intravenous Continuous Rodrigo Mcgranahan, Gordy HERO, MD        Allergies as of 12/17/2024   (No Known Allergies)    Family History  Problem Relation Age of Onset   Colon polyps Mother    Hypertension Mother     COPD Mother    Arthritis Mother    Cancer Father        lung - smoker   Hyperlipidemia Maternal Grandfather    Heart disease Maternal Grandfather    Stroke Maternal Grandfather    Stroke Paternal Grandmother    Heart disease Paternal Grandmother    Hyperlipidemia Paternal Grandfather    Diabetes Neg Hx    Colon cancer Neg Hx    Esophageal cancer Neg Hx    Rectal cancer Neg Hx    Stomach cancer Neg Hx     Social History   Socioeconomic History   Marital status: Married  Spouse name: Inocente   Number of children: 2   Years of education: Not on file   Highest education level: Associate degree: occupational, scientist, product/process development, or vocational program  Occupational History   Occupation: retired  Tobacco Use   Smoking status: Never   Smokeless tobacco: Never  Vaping Use   Vaping status: Never Used  Substance and Sexual Activity   Alcohol use: Yes    Alcohol/week: 1.0 standard drink of alcohol    Types: 1 Cans of beer per week    Comment: 1-2 drinks monthly or less   Drug use: No   Sexual activity: Yes  Other Topics Concern   Not on file  Social History Narrative   Not on file   Social Drivers of Health   Tobacco Use: Low Risk (12/17/2024)   Patient History    Smoking Tobacco Use: Never    Smokeless Tobacco Use: Never    Passive Exposure: Not on file  Financial Resource Strain: Low Risk (07/12/2024)   Overall Financial Resource Strain (CARDIA)    Difficulty of Paying Living Expenses: Not hard at all  Food Insecurity: No Food Insecurity (07/12/2024)   Epic    Worried About Radiation Protection Practitioner of Food in the Last Year: Never true    Ran Out of Food in the Last Year: Never true  Transportation Needs: No Transportation Needs (07/12/2024)   Epic    Lack of Transportation (Medical): No    Lack of Transportation (Non-Medical): No  Physical Activity: Insufficiently Active (07/12/2024)   Exercise Vital Sign    Days of Exercise per Week: 3 days    Minutes of Exercise per Session: 30 min   Stress: No Stress Concern Present (07/12/2024)   [employer] of Occupational Health - Occupational Stress Questionnaire    Feeling of Stress: Not at all  Social Connections: Moderately Isolated (07/12/2024)   Social Connection and Isolation Panel    Frequency of Communication with Friends and Family: Twice a week    Frequency of Social Gatherings with Friends and Family: More than three times a week    Attends Religious Services: Never    Database Administrator or Organizations: No    Attends Banker Meetings: Never    Marital Status: Married  Catering Manager Violence: Not At Risk (07/12/2024)   Epic    Fear of Current or Ex-Partner: No    Emotionally Abused: No    Physically Abused: No    Sexually Abused: No  Depression (PHQ2-9): Low Risk (07/12/2024)   Depression (PHQ2-9)    PHQ-2 Score: 0  Alcohol Screen: Low Risk (07/12/2024)   Alcohol Screen    Last Alcohol Screening Score (AUDIT): 1  Housing: Low Risk (07/12/2024)   Epic    Unable to Pay for Housing in the Last Year: No    Number of Times Moved in the Last Year: 0    Homeless in the Last Year: No  Utilities: Not At Risk (07/12/2024)   Epic    Threatened with loss of utilities: No  Health Literacy: Adequate Health Literacy (07/12/2024)   B1300 Health Literacy    Frequency of need for help with medical instructions: Never    Physical Exam: Vital signs in last 24 hours: @BP  122/71   Pulse 96   Temp 97.9 F (36.6 C) (Skin)   Ht 5' 6 (1.676 m)   Wt 176 lb (79.8 kg)   SpO2 97%   BMI 28.41 kg/m  GEN: NAD EYE: Sclerae anicteric ENT: MMM  CV: Non-tachycardic Pulm: CTA b/l GI: Soft, NT/ND NEURO:  Alert & Oriented x 3   Gordy Starch, MD  Gastroenterology  12/17/2024 9:25 AM  "

## 2024-12-17 NOTE — Patient Instructions (Signed)
 YOU HAD AN ENDOSCOPIC PROCEDURE TODAY AT THE [facility]:   Refer to the procedure report that was given to you for any specific questions about what was found during the examination.  If the procedure report does not answer your questions, please call your gastroenterologist to clarify.  If you requested that your care partner not be given the details of your procedure findings, then the procedure report has been included in a sealed envelope for you to review at your convenience later.  YOU SHOULD EXPECT: Some feelings of bloating in the abdomen. Passage of more gas than usual.  Walking can help get rid of the air that was put into your GI tract during the procedure and reduce the bloating. If you had a lower endoscopy (such as a colonoscopy or flexible sigmoidoscopy) you may notice spotting of blood in your stool or on the toilet paper. If you underwent a bowel prep for your procedure, you may not have a normal bowel movement for a few days.  Please Note:  You might notice some irritation and congestion in your nose or some drainage.  This is from the oxygen used during your procedure.  There is no need for concern and it should clear up in a day or so.  SYMPTOMS TO REPORT IMMEDIATELY:  Following lower endoscopy (colonoscopy or flexible sigmoidoscopy):  Excessive amounts of blood in the stool  Significant tenderness or worsening of abdominal pains  Swelling of the abdomen that is new, acute  Fever of 100F or higher   For urgent or emergent issues, a gastroenterologist can be reached at any hour by calling [phone]. Do not use MyChart messaging for urgent concerns.    DIET:  We do recommend a small meal at first, but then you may proceed to your regular diet.  Drink plenty of fluids but you should avoid alcoholic beverages for 24 hours.  MEDICATIONS: Continue present medications.  FOLLOW UP: Await pathology results. Repeat colonoscopy is recommended for surveillance. The  colonoscopy date will be determined after pathology results from today's exam become available for review.  Educational handouts given to patient: Polyps, Diverticulosis, Hemorrhoids.  Thank you for allowing us  to provide for your healthcare needs today.  ACTIVITY:  You should plan to take it easy for the rest of today and you should NOT DRIVE or use heavy machinery until tomorrow (because of the sedation medicines used during the test).    FOLLOW UP: Our staff will call the number listed on your records the next business day following your procedure.  We will call around 7:15- 8:00 am to check on you and address any questions or concerns that you may have regarding the information given to you following your procedure. If we do not reach you, we will leave a message.     If any biopsies were taken you will be contacted by phone or by letter within the next 1-3 weeks.  Please call us  at [phone] if you have not heard about the biopsies in 3 weeks.    SIGNATURES/CONFIDENTIALITY: You and/or your care partner have signed paperwork which will be entered into your electronic medical record.  These signatures attest to the fact that that the information above on your After Visit Summary has been reviewed and is understood.  Full responsibility of the confidentiality of this discharge information lies with you and/or your care-partner.

## 2024-12-17 NOTE — Progress Notes (Signed)
 Called to room to assist during endoscopic procedure.  Patient ID and intended procedure confirmed with present staff. Received instructions for my participation in the procedure from the performing physician.

## 2024-12-18 ENCOUNTER — Telehealth: Payer: Self-pay

## 2024-12-18 NOTE — Telephone Encounter (Signed)
" °  Follow up Call-     12/17/2024    8:50 AM  Call back number  Post procedure Call Back phone  # [phone]  Permission to leave phone message Yes     Patient questions:  Do you have a fever, pain , or abdominal swelling? No. Pain Score  0 *  Have you tolerated food without any problems? Yes.    Have you been able to return to your normal activities? Yes.    Do you have any questions about your discharge instructions: Diet   No. Medications  No. Follow up visit  No.  Do you have questions or concerns about your Care? No.  Actions: * If pain score is 4 or above: No action needed, pain <4.   "

## 2024-12-19 LAB — SURGICAL PATHOLOGY

## 2024-12-24 ENCOUNTER — Ambulatory Visit: Payer: Self-pay | Admitting: Internal Medicine

## 2025-01-24 ENCOUNTER — Ambulatory Visit: Admitting: Internal Medicine

## 2025-01-24 ENCOUNTER — Encounter: Payer: Self-pay | Admitting: Internal Medicine

## 2025-01-24 VITALS — BP 118/62 | HR 89 | Temp 98.1°F | Resp 16 | Ht 66.0 in | Wt 168.2 lb

## 2025-01-24 DIAGNOSIS — E119 Type 2 diabetes mellitus without complications: Secondary | ICD-10-CM

## 2025-01-24 DIAGNOSIS — K219 Gastro-esophageal reflux disease without esophagitis: Secondary | ICD-10-CM

## 2025-01-24 DIAGNOSIS — K5909 Other constipation: Secondary | ICD-10-CM

## 2025-01-24 DIAGNOSIS — I10 Essential (primary) hypertension: Secondary | ICD-10-CM

## 2025-01-24 DIAGNOSIS — N401 Enlarged prostate with lower urinary tract symptoms: Secondary | ICD-10-CM

## 2025-01-24 LAB — MICROALBUMIN / CREATININE URINE RATIO
Creatinine,U: 94.1 mg/dL
Microalb Creat Ratio: UNDETERMINED mg/g (ref 0.0–30.0)
Microalb, Ur: <0.7 mg/dL

## 2025-01-24 LAB — CBC WITH DIFFERENTIAL/PLATELET
Basophils Absolute: 0 10*3/uL (ref 0.0–0.1)
Basophils Relative: 0.4 % (ref 0.0–3.0)
Eosinophils Absolute: 0.2 10*3/uL (ref 0.0–0.7)
Eosinophils Relative: 2.8 % (ref 0.0–5.0)
HCT: 41.7 % (ref 39.0–52.0)
Hemoglobin: 14.3 g/dL (ref 13.0–17.0)
Lymphocytes Relative: 34 % (ref 12.0–46.0)
Lymphs Abs: 2.2 10*3/uL (ref 0.7–4.0)
MCHC: 34.3 g/dL (ref 30.0–36.0)
MCV: 89.7 fl (ref 78.0–100.0)
Monocytes Absolute: 0.5 10*3/uL (ref 0.1–1.0)
Monocytes Relative: 7.4 % (ref 3.0–12.0)
Neutro Abs: 3.7 10*3/uL (ref 1.4–7.7)
Neutrophils Relative %: 55.4 % (ref 43.0–77.0)
Platelets: 290 10*3/uL (ref 150.0–400.0)
RBC: 4.65 Mil/uL (ref 4.22–5.81)
RDW: 13.3 % (ref 11.5–15.5)
WBC: 6.6 10*3/uL (ref 4.0–10.5)

## 2025-01-24 LAB — COMPREHENSIVE METABOLIC PANEL WITH GFR
ALT: 23 U/L (ref 3–53)
AST: 15 U/L (ref 5–37)
Albumin: 4.5 g/dL (ref 3.5–5.2)
Alkaline Phosphatase: 75 U/L (ref 39–117)
BUN: 18 mg/dL (ref 6–23)
CO2: 31 meq/L (ref 19–32)
Calcium: 10.4 mg/dL (ref 8.4–10.5)
Chloride: 100 meq/L (ref 96–112)
Creatinine, Ser: 0.95 mg/dL (ref 0.40–1.50)
GFR: 77.73 mL/min
Glucose, Bld: 168 mg/dL — ABNORMAL HIGH (ref 70–99)
Potassium: 4.2 meq/L (ref 3.5–5.1)
Sodium: 138 meq/L (ref 135–145)
Total Bilirubin: 0.6 mg/dL (ref 0.2–1.2)
Total Protein: 7.4 g/dL (ref 6.0–8.3)

## 2025-01-24 LAB — LIPID PANEL
Cholesterol: 101 mg/dL (ref 28–200)
HDL: 32.9 mg/dL — ABNORMAL LOW
LDL Cholesterol: 29 mg/dL (ref 10–99)
NonHDL: 68.42
Total CHOL/HDL Ratio: 3
Triglycerides: 197 mg/dL — ABNORMAL HIGH (ref 10.0–149.0)
VLDL: 39.4 mg/dL (ref 0.0–40.0)

## 2025-01-24 LAB — HEMOGLOBIN A1C: Hgb A1c MFr Bld: 7.8 % — ABNORMAL HIGH (ref 4.6–6.5)

## 2025-01-24 MED ORDER — POLYETHYLENE GLYCOL 3350 17 GM/SCOOP PO POWD: 17.0000 g | Freq: Two times a day (BID) | ORAL | 1 refills | Status: AC | PRN

## 2025-01-24 NOTE — Progress Notes (Signed)
 ==============================  [facility]: [id number]   -- Medical Office Visit --  Patient: Christian Dodson      Age: 79 y.o.       Sex:  male  Date:   01/24/2025 Today's Healthcare Provider: Bernardino KANDICE Cone, MD  ==============================   Chief Complaint: Follow-up (No questions or concerns at this time. Says things are going well. Does want to inquire on blood work.)   Discussed the use of AI scribe software for clinical note transcription with the patient, who gave verbal consent to proceed.  History of Present Illness 79 year old male who presents for a six-month follow-up visit.  He monitors his blood pressure at home, with readings typically around 120/70-80 mmHg. He has been on hydrochlorothiazide  and lisinopril  for years without issues. He recalls that his kidney function has been described as age-related mild kidney disease, and he is monitoring it regularly.  He experiences heartburn and occasional reflux, especially at night, and uses Pepcid Complete to manage symptoms. He is unable to taper from 20 mg of omeprazole . He has a history of ulcers, with reflux sometimes triggered by sinus drainage or spicy foods.  He has benign prostatic hyperplasia (BPH) and is on finasteride . His PSA has been slowly rising, but he follows up with a urologist. He had two biopsies in the past and underwent a procedure to shrink his prostate using microwave therapy.  His diabetes is diet-controlled, and he does not take medication for it. He recalls a past incident in the [employer] where his blood sugar was high, described as a 'high urethra threshold.' He monitors his diet and had his vision checked in October.  He experiences constipation following gallbladder removal, with episodes lasting a couple of days. He has not tried fiber supplements.  He had a trigger finger that resolved after a cortisone shot and a fungal toe infection that has  cleared.   Updated Problem List Entries: Problem  Controlled Type 2 Diabetes Mellitus (Hcc)   Labs and Risk Scores: Lab Results  Component Value Date   HGBA1C 7.1 (H) 07/26/2024   HGBA1C 6.9 (H) 01/23/2024   HGBA1C 6.6 (H) 07/12/2023   HGBA1C 7.0 (H) 01/06/2023   HGBA1C 6.8 (H) 05/03/2022   Lab Results  Component Value Date   LDLCALC 49 07/26/2024   No results found for: LABMICR, MICROALBUR  Diabetes Composite Score: 5  Values used to calculate this score:   Points  Metrics      1        Blood Pressure: 118/62      1        Prescribed Statins: N/A - patient does not meet statin therapy criteria      1        Hemoglobin A1c: 7.1%      1        Smokes Tobacco: No      1        Prescribed Aspirin : N/A - patient does not meet cardiovascular risk criteria   Health Maintenance: Diabetes Health Maintenance Due  Topic Date Due   HEMOGLOBIN A1C  01/26/2025   FOOT EXAM  07/26/2025   OPHTHALMOLOGY EXAM  10/03/2025   07/26/2024 Diabetic Foot Exam - Simple   No data filed    Care Team Ophthalmologist:  Waylan Cain, MD      Htn (Hypertension)    Current hypertension medications:       Sig   hydrochlorothiazide  (HYDRODIURIL ) 25  MG tablet (Taking) TAKE 1 TABLET BY MOUTH DAILY   lisinopril  (ZESTRIL ) 40 MG tablet (Taking) TAKE 1 TABLET BY MOUTH DAILY      Lab Results  Component Value Date   NA 140 07/26/2024   K 4.0 07/26/2024   CREATININE 1.02 07/26/2024   GFR 71.63 07/26/2024     Bph (Benign Prostatic Hyperplasia)   Lab Results  Component Value Date   PSA 2.59 07/26/2024   PSA 2.07 07/12/2023   PSA 1.68 04/27/2021   Takes finasteride  Self-Discontinued Flomax  Following with [facility] History of 2 biopsy in [place] , for PSA up to 10 at one point, but improved with microwave procedure and finasteride     Onychomycosis (Resolved)   Onychomycosis: Has a history of toenail fungus. We will prescribe topical Terbinafine  for 12 weeks and  Cyclopirox.    Background Reviewed: Problem List: has HTN (hypertension); GERD (gastroesophageal reflux disease); BPH (benign prostatic hyperplasia); Controlled type 2 diabetes mellitus (HCC); S/P total right hip arthroplasty; Overweight; Post-cholecystectomy syndrome; Seasonal allergies; Trigger finger; and Hyperlipidemia on their problem list. Past Medical History:  has a past medical history of Arthritis, BPH (benign prostatic hyperplasia) (10/19/2013), GERD (gastroesophageal reflux disease), History of cholecystectomy (01/06/2023), History of stomach ulcers, Hypertension, Onychomycosis (07/08/2023), Pneumonia, Pre-diabetes, Seasonal allergies (01/06/2023), Skin cancer, and Ulcer. Past Surgical History:   has a past surgical history that includes left shoulder rotator and bicep (01/10/2017); Medial partial knee replacement (Right, 2013); Appendectomy; Tonsillectomy and adenoidectomy; Inguinal hernia repair (Right); Colonoscopy (2010); Upper gi endoscopy; Total hip arthroplasty (Left, 09/30/2020); Cholecystectomy (N/A, 12/21/2021); ERCP (N/A, 12/20/2021); sphincterotomy (12/20/2021); removal of stones (12/20/2021); Total hip arthroplasty (Right, 01/19/2022); Eye surgery (2023); and Joint replacement (2013, 2021 & 2023). Social History:   reports that he has never smoked. He has never been exposed to tobacco smoke. He has never used smokeless tobacco. He reports current alcohol use of about 1.0 standard drink of alcohol per week. He reports that he does not use drugs. Family History:  family history includes Arthritis in his mother; COPD in his mother; Cancer in his father; Colon polyps in his mother; Heart disease in his maternal grandfather and paternal grandmother; Hyperlipidemia in his maternal grandfather and paternal grandfather; Hypertension in his mother; Stroke in his maternal grandfather and paternal grandmother. Allergies:  has no known allergies.   Medication Reconciliation: Current  Outpatient Medications on File Prior to Visit  Medication Sig   Ascorbic Acid (VITAMIN C PO) Take 1 tablet by mouth every morning.   aspirin  EC 81 MG tablet Take 81 mg by mouth daily. Swallow whole.   cetirizine (ZYRTEC) 10 MG tablet Take 10 mg by mouth every morning.   Cholecalciferol (VITAMIN D3) POWD Take by mouth.   finasteride  (PROSCAR ) 5 MG tablet Take 1 tablet (5 mg total) by mouth at bedtime.   Glucosamine-Chondroitin (OSTEO BI-FLEX REGULAR STRENGTH PO) Take by mouth.   hydrochlorothiazide  (HYDRODIURIL ) 25 MG tablet TAKE 1 TABLET BY MOUTH DAILY   lisinopril  (ZESTRIL ) 40 MG tablet TAKE 1 TABLET BY MOUTH DAILY   omeprazole  (PRILOSEC) 20 MG capsule Take 20 mg by mouth daily.   Probiotic Product (PROBIOTIC DAILY PO) Take 1 tablet by mouth daily.   simvastatin  (ZOCOR ) 20 MG tablet Take 1 tablet (20 mg total) by mouth at bedtime.   VITAMIN D PO Take 1 tablet by mouth every morning.   zinc gluconate 50 MG tablet Take 50 mg by mouth daily.   No current facility-administered medications on file prior to visit.   Medications  Discontinued During This Encounter  Medication Reason   GLUCOSAMINE-CHONDROITIN PO Duplicate   amoxicillin  (AMOXIL ) 500 MG capsule Completed Course     Physical Exam:    01/24/2025    8:33 AM 12/17/2024   10:12 AM 12/17/2024   10:02 AM  Vitals with BMI  Height 5' 6    Weight 168 lbs 3 oz    BMI 27.16    Systolic 118 126 897  Diastolic 62 70 64  Pulse 89 74 80  Vital signs reviewed.  Nursing notes reviewed. Weight trend reviewed. Physical Activity: Insufficiently Active (01/20/2025)   Exercise Vital Sign    Days of Exercise per Week: 2 days    Minutes of Exercise per Session: 10 min   General Appearance:  No acute distress appreciable.   Well-groomed, healthy-appearing male.  Well proportioned with no abnormal fat distribution.  Good muscle tone. Pulmonary:  Normal work of breathing at rest, no respiratory distress apparent. SpO2: 95 %  Musculoskeletal: All  extremities are intact.  Neurological:  Awake, alert, oriented, and engaged.  No obvious focal neurological deficits or cognitive impairments.  Sensorium seems unclouded.   Speech is clear and coherent with logical content. Psychiatric:  Appropriate mood, pleasant and cooperative demeanor, thoughtful and engaged during the exam   Verbalized to patient: Physical Exam VITALS: BP- 118/62   Verbalized to patient: Results Labs Renal function panel (07/2024): Estimated glomerular filtration rate 71%, stable and age-appropriate PSA (09/2024): Stable, slowly increasing over years; not concerning per urologist A1c (07/2024): Approximately 7%     01/24/2025    8:30 AM 07/12/2024    3:08 PM 01/23/2024    8:14 AM 07/08/2023    8:21 AM  PHQ 2/9 Scores  PHQ - 2 Score 0 0 0 0  PHQ- 9 Score  0        Data saved with a previous flowsheet row definition    {   No results found for any visits on 01/24/25.} Procedure visit on 12/17/2024  Component Date Value Ref Range Status   SURGICAL PATHOLOGY 12/17/2024    Final-Edited                   Value:SURGICAL PATHOLOGY [facility] [address], [address] [place], [place] [address] Telephone [phone] or [phone] Fax [phone]  REPORT OF SURGICAL PATHOLOGY   Accession #: [id number] Patient Name: JORON, VELIS Visit # : [id number]  MRN: [id number] Physician: Albertus Gordy HERO. DOB/Age May 06, 1946 (Age: 74) Gender: M Collected Date: 12/17/2024 Received Date: 12/18/2024  FINAL DIAGNOSIS       1. Surgical [P], colon, transverse and ascending, polyp (2) :       -  TUBULAR ADENOMA, FRAGMENTS.       DATE SIGNED OUT: 12/19/2024 ELECTRONIC SIGNATURE : Legolvan Do, Mark, Pathologist, Electronic Signature  MICROSCOPIC DESCRIPTION  CASE COMMENTS STAINS USED IN DIAGNOSIS: H&E H&E-2    CLINICAL HISTORY  SPECIMEN(S) OBTAINED 1. Surgical [P], Colon, Transverse And Ascending, Polyp (2)  SPECIMEN  COMMENTS: 1. History of colonic polyps; benign neoplasm of ascending colon; benign neoplasm of transverse colon SPECIMEN CLINICAL                          INFORMATION: 1. R/O adenoma    Gross Description 1. Received in formalin are tan, soft tissue fragments that are submitted in toto. Number: multiple, Size: 0.3 cm smallest to 0.5 cm largest, (1B) ( TA )  Report signed out from the following location(s) [place] [address] [place], [place] [address] CLIA #: [id number]  [facility] [address] [place], [place] [address] CLIA #: [id number]   Office Visit on 07/26/2024  Component Date Value Ref Range Status   Cholesterol 07/26/2024 120  0 - 200 mg/dL Final   Triglycerides 91/92/7974 171.0 (H)  0.0 - 149.0 mg/dL Final   HDL 91/92/7974 36.40 (L)  >60.99 mg/dL Final   VLDL 91/92/7974 34.2  0.0 - 40.0 mg/dL Final   LDL Cholesterol 07/26/2024 49  0 - 99 mg/dL Final   Total CHOL/HDL Ratio 07/26/2024 3   Final   NonHDL 07/26/2024 83.32   Final   Sodium 07/26/2024 140  135 - 145 mEq/L Final   Potassium 07/26/2024 4.0  3.5 - 5.1 mEq/L Final   Chloride 07/26/2024 99  96 - 112 mEq/L Final   CO2 07/26/2024 25  19 - 32 mEq/L Final   Glucose, Bld 07/26/2024 156 (H)  70 - 99 mg/dL Final   BUN 91/92/7974 19  6 - 23 mg/dL Final   Creatinine, Ser 07/26/2024 1.02  0.40 - 1.50 mg/dL Final   Total Bilirubin 07/26/2024 0.6  0.2 - 1.2 mg/dL Final   Alkaline Phosphatase 07/26/2024 71  39 - 117 U/L Final   AST 07/26/2024 12  0 - 37 U/L Final   ALT 07/26/2024 18  0 - 53 U/L Final   Total Protein 07/26/2024 7.4  6.0 - 8.3 g/dL Final   Albumin  07/26/2024 4.7  3.5 - 5.2 g/dL Final   GFR 91/92/7974 71.63  >60.00 mL/min Final   Calcium 07/26/2024 10.4  8.4 - 10.5 mg/dL Final   WBC 91/92/7974 6.4  4.0 - 10.5 K/uL Final   RBC 07/26/2024 4.80  4.22 - 5.81 Mil/uL Final   Hemoglobin 07/26/2024 14.6  13.0 - 17.0 g/dL Final   HCT 91/92/7974 43.1  39.0 -  52.0 % Final   MCV 07/26/2024 89.7  78.0 - 100.0 fl Final   MCHC 07/26/2024 34.0  30.0 - 36.0 g/dL Final   RDW 91/92/7974 13.9  11.5 - 15.5 % Final   Platelets 07/26/2024 257.0  150.0 - 400.0 K/uL Final   Neutrophils Relative % 07/26/2024 50.3  43.0 - 77.0 % Final   Lymphocytes Relative 07/26/2024 38.8  12.0 - 46.0 % Final   Monocytes Relative 07/26/2024 7.1  3.0 - 12.0 % Final   Eosinophils Relative 07/26/2024 3.6  0.0 - 5.0 % Final   Basophils Relative 07/26/2024 0.2  0.0 - 3.0 % Final   Neutro Abs 07/26/2024 3.2  1.4 - 7.7 K/uL Final   Lymphs Abs 07/26/2024 2.5  0.7 - 4.0 K/uL Final   Monocytes Absolute 07/26/2024 0.5  0.1 - 1.0 K/uL Final   Eosinophils Absolute 07/26/2024 0.2  0.0 - 0.7 K/uL Final   Basophils Absolute 07/26/2024 0.0  0.0 - 0.1 K/uL Final   TSH 07/26/2024 1.980  0.450 - 4.500 uIU/mL Final   Hgb A1c MFr Bld 07/26/2024 7.1 (H)  4.6 - 6.5 % Final   PSA 07/26/2024 2.59  0.10 - 4.00 ng/mL Final   Creatinine, Urine 07/26/2024 79  20 - 320 mg/dL Final   Protein/Creat Ratio 07/26/2024 51  25 - 148 mg/g creat Final   Protein/Creatinine Ratio 07/26/2024 0.051  0.025 - 0.148 mg/mg creat Final   Total Protein, Urine 07/26/2024 4 (L)  5 - 25 mg/dL Final  Scanned Document on 01/23/2024  Component Date  Value Ref Range Status   HM Diabetic Eye Exam 09/22/2023    Final  Office Visit on 01/23/2024  Component Date Value Ref Range Status   WBC 01/23/2024 7.2  4.0 - 10.5 K/uL Final   RBC 01/23/2024 4.88  4.22 - 5.81 Mil/uL Final   Hemoglobin 01/23/2024 15.6  13.0 - 17.0 g/dL Final   HCT 97/96/7974 45.2  39.0 - 52.0 % Final   MCV 01/23/2024 92.6  78.0 - 100.0 fl Final   MCHC 01/23/2024 34.4  30.0 - 36.0 g/dL Final   RDW 97/96/7974 13.2  11.5 - 15.5 % Final   Platelets 01/23/2024 270.0  150.0 - 400.0 K/uL Final   Neutrophils Relative % 01/23/2024 48.9  43.0 - 77.0 % Final   Lymphocytes Relative 01/23/2024 40.4  12.0 - 46.0 % Final   Monocytes Relative 01/23/2024 7.5  3.0 - 12.0 %  Final   Eosinophils Relative 01/23/2024 2.9  0.0 - 5.0 % Final   Basophils Relative 01/23/2024 0.3  0.0 - 3.0 % Final   Neutro Abs 01/23/2024 3.5  1.4 - 7.7 K/uL Final   Lymphs Abs 01/23/2024 2.9  0.7 - 4.0 K/uL Final   Monocytes Absolute 01/23/2024 0.5  0.1 - 1.0 K/uL Final   Eosinophils Absolute 01/23/2024 0.2  0.0 - 0.7 K/uL Final   Basophils Absolute 01/23/2024 0.0  0.0 - 0.1 K/uL Final   Sodium 01/23/2024 137  135 - 145 mEq/L Final   Potassium 01/23/2024 3.6  3.5 - 5.1 mEq/L Final   Chloride 01/23/2024 102  96 - 112 mEq/L Final   CO2 01/23/2024 24  19 - 32 mEq/L Final   Glucose, Bld 01/23/2024 162 (H)  70 - 99 mg/dL Final   BUN 97/96/7974 21  6 - 23 mg/dL Final   Creatinine, Ser 01/23/2024 1.04  0.40 - 1.50 mg/dL Final   Total Bilirubin 01/23/2024 0.7  0.2 - 1.2 mg/dL Final   Alkaline Phosphatase 01/23/2024 56  39 - 117 U/L Final   AST 01/23/2024 21  0 - 37 U/L Final   ALT 01/23/2024 29  0 - 53 U/L Final   Total Protein 01/23/2024 7.2  6.0 - 8.3 g/dL Final   Albumin  01/23/2024 4.5  3.5 - 5.2 g/dL Final   GFR 97/96/7974 70.23  >60.00 mL/min Final   Calcium 01/23/2024 9.5  8.4 - 10.5 mg/dL Final   Cholesterol 97/96/7974 117  0 - 200 mg/dL Final   Triglycerides 97/96/7974 173.0 (H)  0.0 - 149.0 mg/dL Final   HDL 97/96/7974 32.40 (L)  >60.99 mg/dL Final   VLDL 97/96/7974 34.6  0.0 - 40.0 mg/dL Final   LDL Cholesterol 01/23/2024 50  0 - 99 mg/dL Final   Total CHOL/HDL Ratio 01/23/2024 4   Final   NonHDL 01/23/2024 84.54   Final   Hgb A1c MFr Bld 01/23/2024 6.9 (H)  4.6 - 6.5 % Final  Admission on 01/15/2024, Discharged on 01/15/2024  Component Date Value Ref Range Status   SARS Coronavirus 2 by RT PCR 01/15/2024 NEGATIVE  NEGATIVE Final   Influenza A by PCR 01/15/2024 POSITIVE (A)  NEGATIVE Final   Influenza B by PCR 01/15/2024 NEGATIVE  NEGATIVE Final   Resp Syncytial Virus by PCR 01/15/2024 NEGATIVE  NEGATIVE Final  Lab on 07/12/2023  Component Date Value Ref Range Status    WBC 07/12/2023 9.6  4.0 - 10.5 K/uL Final   RBC 07/12/2023 4.87  4.22 - 5.81 Mil/uL Final   Hemoglobin 07/12/2023 15.2  13.0 - 17.0  g/dL Final   HCT 92/76/7975 45.0  39.0 - 52.0 % Final   MCV 07/12/2023 92.4  78.0 - 100.0 fl Final   MCHC 07/12/2023 33.7  30.0 - 36.0 g/dL Final   RDW 92/76/7975 13.7  11.5 - 15.5 % Final   Platelets 07/12/2023 242.0  150.0 - 400.0 K/uL Final   Neutrophils Relative % 07/12/2023 58.3  43.0 - 77.0 % Final   Lymphocytes Relative 07/12/2023 31.4  12.0 - 46.0 % Final   Monocytes Relative 07/12/2023 8.0  3.0 - 12.0 % Final   Eosinophils Relative 07/12/2023 2.0  0.0 - 5.0 % Final   Basophils Relative 07/12/2023 0.3  0.0 - 3.0 % Final   Neutro Abs 07/12/2023 5.6  1.4 - 7.7 K/uL Final   Lymphs Abs 07/12/2023 3.0  0.7 - 4.0 K/uL Final   Monocytes Absolute 07/12/2023 0.8  0.1 - 1.0 K/uL Final   Eosinophils Absolute 07/12/2023 0.2  0.0 - 0.7 K/uL Final   Basophils Absolute 07/12/2023 0.0  0.0 - 0.1 K/uL Final   Sodium 07/12/2023 136  135 - 145 mEq/L Final   Potassium 07/12/2023 4.3  3.5 - 5.1 mEq/L Final   Chloride 07/12/2023 100  96 - 112 mEq/L Final   CO2 07/12/2023 27  19 - 32 mEq/L Final   Glucose, Bld 07/12/2023 144 (H)  70 - 99 mg/dL Final   BUN 92/76/7975 22  6 - 23 mg/dL Final   Creatinine, Ser 07/12/2023 1.11  0.40 - 1.50 mg/dL Final   Total Bilirubin 07/12/2023 0.9  0.2 - 1.2 mg/dL Final   Alkaline Phosphatase 07/12/2023 71  39 - 117 U/L Final   AST 07/12/2023 11  0 - 37 U/L Final   ALT 07/12/2023 19  0 - 53 U/L Final   Total Protein 07/12/2023 7.3  6.0 - 8.3 g/dL Final   Albumin  07/12/2023 4.5  3.5 - 5.2 g/dL Final   GFR 92/76/7975 65.19  >60.00 mL/min Final   Calcium 07/12/2023 10.3  8.4 - 10.5 mg/dL Final   TSH 92/76/7975 1.90  0.35 - 5.50 uIU/mL Final   Cholesterol 07/12/2023 120  0 - 200 mg/dL Final   Triglycerides 92/76/7975 169.0 (H)  0.0 - 149.0 mg/dL Final   HDL 92/76/7975 38.70 (L)  >60.99 mg/dL Final   VLDL 92/76/7975 33.8  0.0 - 40.0 mg/dL  Final   LDL Cholesterol 07/12/2023 48  0 - 99 mg/dL Final   Total CHOL/HDL Ratio 07/12/2023 3   Final   NonHDL 07/12/2023 81.61   Final   PSA 07/12/2023 2.07  0.10 - 4.00 ng/mL Final   Hgb A1c MFr Bld 07/12/2023 6.6 (H)  4.6 - 6.5 % Final  Scanned Document on 06/01/2023  Component Date Value Ref Range Status   HM Diabetic Eye Exam 12/01/2022    Final   HM Diabetic Eye Exam 11/02/2022    Final   HM Diabetic Eye Exam 10/25/2022    Final  No image results found. No results found.       ASSESSMENT & PLAN   Assessment & Plan Primary hypertension Blood pressure is well-controlled with hydrochlorothiazide  and lisinopril , with home readings around 120/70-80 mmHg. Kidney function is at 71%, normal for his age, but requires monitoring due to potential medication impact. Blood work is ordered to monitor kidney function and adjust medication if necessary. Benign prostatic hyperplasia with urinary frequency BPH is managed with finasteride . PSA levels are slowly rising but not concerning. Previous microwave therapy was used for prostate reduction. Stronger  medications were discussed but no changes are planned. Finasteride  will continue, and PSA levels will be monitored as per urologist's recommendations. Defer to urology Controlled type 2 diabetes mellitus (HCC) Diabetes is diet-controlled, though past blood sugar levels have been high due to unique metabolism. Discussed NAD supplementation for metabolism and cardiovascular health, and the potential use of Unicity supplement for diabetes management, despite its cost. Ozempic will be considered if A1c levels increase significantly. Diabetes blood work and microalbumin to creatinine urine ratio are ordered to assess kidney function. Chronic constipation Intermittent constipation is likely related to dietary habits, with no alarm symptoms present. Emphasized the importance of regular bowel movements to prevent complications. Recommended a high fiber diet  or supplements like Metamucil or Miralax , and considering Unicity supplement for fiber intake. Gastroesophageal reflux disease, unspecified whether esophagitis present GERD is effectively managed with omeprazole  20 mg. Occasional laryngeal reflux, especially at night, is managed with Pepcid Complete. Discussed switching to Dexalot for stronger relief, but he prefers omeprazole . Risks of long-term omeprazole  use, including kidney impact and vitamin depletion, were discussed. Omeprazole  will continue, with Pepcid Complete as needed, and a potential switch to Dexalot will be monitored if insurance allows.  ORDER ASSOCIATIONS  #   DIAGNOSIS / CONDITION ICD-10 ENCOUNTER ORDER     ICD-10-CM   1. Primary hypertension  I10     2. Benign prostatic hyperplasia with urinary frequency  N40.1    R35.0     3. Controlled type 2 diabetes mellitus (HCC)  E11.9 CBC with Differential/Platelet    Comprehensive metabolic panel with GFR    Lipid panel    Hemoglobin A1c    Microalbumin / creatinine urine ratio    4. Chronic constipation  K59.09 polyethylene glycol powder (GLYCOLAX /MIRALAX ) 17 GM/SCOOP powder    5. Gastroesophageal reflux disease, unspecified whether esophagitis present  K21.9          Orders Placed in Encounter:   Lab Orders         CBC with Differential/Platelet         Comprehensive metabolic panel with GFR         Lipid panel         Hemoglobin A1c         Microalbumin / creatinine urine ratio     Meds ordered this encounter  Medications   polyethylene glycol powder (GLYCOLAX /MIRALAX ) 17 GM/SCOOP powder    Sig: Take 17 g by mouth 2 (two) times daily as needed. Dissolve 1 capful (17g) in 4-8 ounces of liquid and take by mouth daily.    Dispense:  3350 g    Refill:  1   Orders Placed This Encounter  Procedures   CBC with Differential/Platelet   Comprehensive metabolic panel with GFR    [place]    Has the patient fasted?:   No   Lipid panel    [place]    Has the patient  fasted?:   No   Hemoglobin A1c    [place]   Microalbumin / creatinine urine ratio       This document was synthesized by artificial intelligence (Abridge) using HIPAA-compliant recording of the clinical interaction;   We discussed the use of AI scribe software for clinical note transcription with the patient, who gave verbal consent to proceed. additional Info: This encounter employed state-of-the-art, real-time, collaborative documentation. The patient actively reviewed and assisted in updating their electronic medical record on a shared screen, ensuring transparency and facilitating joint problem-solving for the problem list, overview, and  plan. This approach promotes accurate, informed care. The treatment plan was discussed and reviewed in detail, including medication safety, potential side effects, and all patient questions. We confirmed understanding and comfort with the plan. Follow-up instructions were established, including contacting the office for any concerns, returning if symptoms worsen, persist, or new symptoms develop, and precautions for potential emergency department visits.

## 2025-01-24 NOTE — Assessment & Plan Note (Signed)
 BPH is managed with finasteride . PSA levels are slowly rising but not concerning. Previous microwave therapy was used for prostate reduction. Stronger medications were discussed but no changes are planned. Finasteride  will continue, and PSA levels will be monitored as per urologist's recommendations. Defer to urology

## 2025-01-24 NOTE — Assessment & Plan Note (Signed)
 Blood pressure is well-controlled with hydrochlorothiazide  and lisinopril , with home readings around 120/70-80 mmHg. Kidney function is at 71%, normal for his age, but requires monitoring due to potential medication impact. Blood work is ordered to monitor kidney function and adjust medication if necessary.

## 2025-01-24 NOTE — Assessment & Plan Note (Signed)
 Diabetes is diet-controlled, though past blood sugar levels have been high due to unique metabolism. Discussed NAD supplementation for metabolism and cardiovascular health, and the potential use of Unicity supplement for diabetes management, despite its cost. Ozempic will be considered if A1c levels increase significantly. Diabetes blood work and microalbumin to creatinine urine ratio are ordered to assess kidney function.

## 2025-01-24 NOTE — Patient Instructions (Addendum)
 It was a pleasure seeing you today! Your health and satisfaction are our top priorities.  Bernardino Cone, MD  VISIT SUMMARY: During your six-month follow-up visit, we reviewed your blood pressure, diabetes, BPH, constipation, and GERD management. Your blood pressure is well-controlled, and your diabetes remains diet-controlled. We discussed your BPH management and the importance of monitoring your PSA levels. We also addressed your constipation and GERD symptoms, providing recommendations for both.  YOUR PLAN: -PRIMARY HYPERTENSION: Primary hypertension means high blood pressure. Your blood pressure is well-controlled with your current medications, hydrochlorothiazide  and lisinopril . We will continue to monitor your kidney function with blood work to ensure your medications are not affecting it adversely.  -TYPE 2 DIABETES MELLITUS: Type 2 diabetes mellitus is a condition where your body does not use insulin properly. Your diabetes is currently controlled through diet. We discussed the potential benefits of NAD supplementation for metabolism and cardiovascular health, and the use of Unicity supplement for diabetes management. We will consider Ozempic if your A1c levels increase significantly. Blood work and a urine test are ordered to assess your kidney function.  -BENIGN PROSTATIC HYPERPLASIA WITH LOWER URINARY TRACT SYMPTOMS: Benign prostatic hyperplasia (BPH) is an enlarged prostate gland that can cause urinary symptoms. Your BPH is managed with finasteride , and your PSA levels are being monitored. No changes to your medication are planned at this time, and we will continue to follow your urologist's recommendations.  -CHRONIC CONSTIPATION: Chronic constipation is infrequent or difficult bowel movements. It is likely related to your diet. We recommend a high fiber diet or supplements like Metamucil or Miralax  to help manage this. You may also consider the Unicity supplement for additional fiber  intake.  -GASTROESOPHAGEAL REFLUX DISEASE WITH LARYNGEAL REFLUX: Gastroesophageal reflux disease (GERD) is a condition where stomach acid frequently flows back into the tube connecting your mouth and stomach. Your GERD is managed with omeprazole  20 mg, and you use Pepcid Complete for occasional laryngeal reflux. We discussed the risks of long-term omeprazole  use and the possibility of switching to Dexalot for stronger relief if needed.  INSTRUCTIONS: Please complete the blood work and urine test as ordered to monitor your kidney function and diabetes. Continue taking your medications as prescribed and follow the dietary recommendations provided. We will follow up on your PSA levels and adjust your treatment plan as necessary. If you experience any new or worsening symptoms, please contact our office.  Your Providers PCP: Cone Bernardino MATSU, MD,  [phone]) Referring Provider: Cone Bernardino MATSU, MD,  [phone]) Care Team Provider: Cam Morene ORN, MD,  [phone]) Care Team Provider: Waylan Cain, MD,  [phone]) Care Team Provider: Albertus Gordy HERO, MD,  [phone]) Care Team Provider: Ernie Cough, MD,  [phone]) Care Team Provider: Albertus Gordy HERO, MD,  [phone])  NEXT STEPS: [x]  Early Intervention: Schedule sooner appointment, call our on-call services, or go to emergency room if there is any significant Increase in pain or discomfort New or worsening symptoms Sudden or severe changes in your health [x]  Flexible Follow-Up: We recommend a No follow-ups on file. for optimal routine care. This allows for progress monitoring and treatment adjustments. [x]  Preventive Care: Schedule your annual preventive care visit! It's typically covered by insurance and helps identify potential health issues early. [x]  Lab & X-ray Appointments: Incomplete tests scheduled today, or call to schedule. X-rays: [facility] (M-F, 8:30am-noon or 1pm-5pm). [x]  Medical  Information Release: Sign a release form at front desk to obtain relevant medical information we don't have.  MAKING  THE MOST OF OUR FOCUSED 20 MINUTE APPOINTMENTS: [x]   Clearly state your top concerns at the beginning of the visit to focus our discussion [x]   If you anticipate you will need more time, please inform the front desk during scheduling - we can book multiple appointments in the same week. [x]   If you have transportation problems- use our convenient video appointments or ask about transportation support. [x]   We can get down to business faster if you use MyChart to update information before the visit and submit non-urgent questions before your visit. Thank you for taking the time to provide details through MyChart.  Let our nurse know and she can import this information into your encounter documents.  Arrival and Wait Times: [x]   Arriving on time ensures that everyone receives prompt attention. [x]   Early morning (8a) and afternoon (1p) appointments tend to have shortest wait times. [x]   Unfortunately, we cannot delay appointments for late arrivals or hold slots during phone calls.  Getting Answers and Following Up [x]   Simple Questions & Concerns: For quick questions or basic follow-up after your visit, reach us  at [phone] or MyChart messaging. [x]   Complex Concerns: If your concern is more complex, scheduling an appointment might be best. Discuss this with the staff to find the most suitable option. [x]   Lab & Imaging Results: We'll contact you directly if results are abnormal or you don't use MyChart. Most normal results will be on MyChart within 2-3 business days, with a review message from Dr. Jesus. Haven't heard back in 2 weeks? Need results sooner? Contact us  at [phone]. [x]   Referrals: Our referral coordinator will manage specialist referrals. The specialist's office should contact you within 2 weeks to schedule an appointment. Call us  if you haven't heard from  them after 2 weeks.  Staying Connected [x]   MyChart: Activate your MyChart for the fastest way to access results and message us . See the last page of this paperwork for instructions on how to activate.  Bring to Your Next Appointment [x]   Medications: Please bring all your medication bottles to your next appointment to ensure we have an accurate record of your prescriptions. [x]   Health Diaries: If you're monitoring any health conditions at home, keeping a diary of your readings can be very helpful for discussions at your next appointment.  Billing [x]   X-ray & Lab Orders: These are billed by separate companies. Contact the invoicing company directly for questions or concerns. [x]   Visit Charges: Discuss any billing inquiries with our administrative services team.  Your Satisfaction Matters [x]   Share Your Experience: We strive for your satisfaction! If you have any complaints, or preferably compliments, please let Dr. Jesus know directly or contact our Practice Administrators, Manuelita Rubin or [employer], by asking at the front desk.   Reviewing Your Records [x]   Review this early draft of your clinical encounter notes below and the final encounter summary tomorrow on MyChart after its been completed.  All orders placed so far are visible here: Primary hypertension  Benign prostatic hyperplasia with urinary frequency  Controlled type 2 diabetes mellitus (HCC) -     CBC with Differential/Platelet -     Comprehensive metabolic panel with GFR -     Lipid panel -     Hemoglobin A1c -     Microalbumin / creatinine urine ratio  Chronic constipation -     Polyethylene Glycol 3350 ; Take 17 g by mouth 2 (two) times daily as needed. Dissolve 1 capful (  17g) in 4-8 ounces of liquid and take by mouth daily.  Dispense: 3350 g; Refill: 1  Gastroesophageal reflux disease, unspecified whether esophagitis present    NAD+ supplementation with precursors such as nicotinamide riboside (NR) and  nicotinamide mononucleotide (NMN) is safe, increases NAD+ levels in blood and tissues, and shows moderately promising effects on metabolic and cardiovascular health, though robust clinical efficacy for specific disease endpoints remains unproven.[1][2][3]  NMN and NR have the most evidence-based support among NAD+ precursors for raising NAD+ levels in humans.[1][4] Pharmaceutical-grade ?NMN formulations, such as MIB-626, demonstrate substantial dose-dependent increases in blood NAD+ (1.7- to 3.7-fold above baseline) with favorable safety profiles in middle-aged and older adults.[5] Both NMN and NR are well-tolerated, with the most common side effects being mild and including muscle pain, fatigue, sleep disturbance, and headaches.[3]  Clinical benefits observed in early trials include improved muscle insulin sensitivity, modestly reduced blood pressure, enhanced arterial compliance, and suppression of inflammatory activation in heart failure patients.[6][2] Additional improvements have been reported in quality of life, anxiety reduction, and inflammatory cytokine profiles.[3][7] However, most positive outcomes do not include hard clinical endpoints such as significant reductions in weight, BMI, fasting glucose, or HbA1c.[7]  Current evidence is limited by short intervention durations, small sample sizes, and heterogeneous dosing regimens across studies.[1][2] Large, long-term randomized controlled trials are needed to establish optimal formulations, doses, treatment durations, and to identify patient populations most likely to benefit from NAD+ augmentation.[2][4][8] Future studies should also focus on identifying subjects with recognized NAD+ deficiency using validated biomarkers to better target therapy.[7][8]   Would you like me to summarize the latest large, long-term randomized controlled trials comparing NMN and NR for specific clinical outcomes, such as cardiovascular events or metabolic disease  progression? This could clarify which formulation has the strongest evidence for meaningful patient benefit. References Dietary Supplementation With NAD+-Boosting Compounds in Humans: Current Knowledge and Future Directions. Buelah LONNE Huntsman CC, Martens CR, Seals DR, [place] [facility]. The Journals of Gerontology. Series A, Orthoptist. 2023;78(12):2435-2448. doi:10.1093/gerona/glad106. Nicotinamide Adenine Dinucleotide in Aging Biology: Potential Applications and Many Unknowns. Bhasin S, Seals D, Migaud M, Musi N, [place]. Endocrine Reviews. 2023;44(6):1047-1073. doi:10.1210/endrev/bnad019. Evaluation of Safety and Effectiveness of NAD in Different Clinical Conditions: A Systematic Review. Gindri IM, Ferrari G, Pinto LPS, et al. American Journal of Physiology. Endocrinology and Metabolism. 2024;326(4):E417-E427. doi:10.1152/ajpendo.99757.7976. The Safety and Antiaging Effects of Nicotinamide Mononucleotide in Human Clinical Trials: An Update. Rhenda ELINORE Zelphia OLEGARIO Jerri MARLA, et al. Advances in Nutrition [place], Md.). 2023;14(6):1416-1435. doi:10.1016/j.advnut.2023.08.008. MIB-626, an Oral Formulation of a Microcrystalline Unique Polymorph of ?-Nicotinamide Mononucleotide, Increases Circulating Nicotinamide Adenine Dinucleotide and Its Metabolome in Middle-Aged and Older Adults. [place] NUTLEY, Lavu S, [place], et al. [employer] of Gerontology. Series A, Orthoptist. 2023;78(1):90-96. doi:10.1093/gerona/glac049. Impact of Geroscience on Therapeutic Strategies for Older Adults With Cardiovascular Disease: Restaurant Manager, Fast Food. Mackie SHEERER, Kuchel GA, Ethyl SPEAK, et al. Journal of the [employer] of Cardiology. 2023;82(7):631-647. doi:10.1016/j.jacc.2023.05.038. NAD+-Increasing Strategies to Improve Cardiometabolic Health?. Blanco-Vaca F, Rotllan N, Canyelles M, et al. Frontiers in Endocrinology. 2021;12:815565.  doi:10.3389/fendo.2021.815565. Regulation of and Challenges in Targeting NAD+ Metabolism. Migaud ME, Ziegler M, Baur JA. Nature Reviews. Molecular Cell Biology. 2024;25(10):822-840. doi:10.1038/s41580-024-00752-w.    [employer]  About [employer] in a new windowGemini AppOpens in a new windowSubscriptionsOpens in a new windowFor BusinessOpens in a new window   Sign in to start saving your chats Once you're signed in, you can access your recent chats here. Conversation with [employer]  is unicity a reasonable support treatment for diabetes Whether Unicity is a reasonable treatment depends on how you define the term. In the medical world, it is generally considered a supportive nutritional supplement rather than a primary medical treatment for diabetes. The core of Unicity's metabolic health line is Development Worker, Community (formerly [employer]), a fiber-based supplement designed to be taken before meals. Here is a breakdown of its role, the science behind it, and the necessary precautions.  1. How It Works Development Worker, Community is primarily a blend of soluble and insoluble fibers, phytosterols, and vitamins. Glucose Regulation: When taken before a meal, the fiber creates a viscous gel in the digestive tract. This slows the rate at which your body absorbs carbohydrates, which can help prevent the spike and crash of blood sugar after eating. Cholesterol Support: The phytosterols in the blend compete with cholesterol for absorption, which may help lower LDL (bad) cholesterol levels. Satiety: Fiber helps you feel full longer, which can indirectly assist with weight management--a key factor in managing Type 2 diabetes. 2. Is It Reasonable? From a lifestyle and support perspective, yes--it is a reasonable tool for some, but with several caveats: Pros Cons / Risks  Evidence-backed ingredients: High-fiber diets are medically proven to improve glycemic control. Not a cure: It cannot replace insulin or oral  medications like Metformin for most patients.  PDR Listing: Some Unicity products have been listed in the Prescribers' Digital Reference (PDR), meaning they are recognized as quality-manufactured supplements. Cost: It is significantly more expensive than standard fiber supplements (like Psyllium husk) available at pharmacies.  Low side-effect profile: Mostly limited to temporary bloating or gas as the body adjusts to higher fiber. Inconsistent results: Supplement efficacy varies wildly between individuals based on their overall diet and severity of diabetes.  3. The [employer] Unicity often markets the [employer], which combines Balance with Unimate (a yerba mate drink) and an intermittent fasting schedule (16:8). Yerba Mate: Can improve focus and suppress appetite during the fasting window. Intermittent Fasting: Has strong clinical evidence for improving insulin sensitivity. Peer Correction: While Unicity's marketing may frame this as a revolutionary system, the magic is largely coming from the intermittent fasting and high fiber intake, both of which you can achieve through whole foods and standard habits at a lower cost.  4. Important Precautions The Choke Risk: Because it contains high-viscosity fiber, you must drink it with plenty of water . If taken with too little liquid, it can swell in the throat or esophagus. Medication Interference: High fiber can sometimes slow the absorption of other oral medications. Always take your prescriptions at least one hour before or 2-4 hours after consuming a high-fiber supplement. Hypoglycemia: If you are on insulin or sulfonylureas, using a product that lowers blood sugar absorption could potentially cause your levels to drop too low. Next Step Would you like me to help you compare the nutritional content of Unicity Balance against more affordable over-the-counter fiber options so you can see the difference?

## 2025-01-24 NOTE — Assessment & Plan Note (Signed)
 GERD is effectively managed with omeprazole  20 mg. Occasional laryngeal reflux, especially at night, is managed with Pepcid Complete. Discussed switching to Dexalot for stronger relief, but he prefers omeprazole . Risks of long-term omeprazole  use, including kidney impact and vitamin depletion, were discussed. Omeprazole  will continue, with Pepcid Complete as needed, and a potential switch to Dexalot will be monitored if insurance allows.

## 2025-07-18 ENCOUNTER — Ambulatory Visit

## 2025-08-08 ENCOUNTER — Encounter: Admitting: Internal Medicine
